# Patient Record
Sex: Female | Born: 1975
Health system: Southern US, Community
[De-identification: ages and names within clinical notes are randomized; demographics above are authoritative.]

## PROBLEM LIST (undated history)

## (undated) DIAGNOSIS — F419 Anxiety disorder, unspecified: Secondary | ICD-10-CM

## (undated) DIAGNOSIS — I442 Atrioventricular block, complete: Secondary | ICD-10-CM

## (undated) DIAGNOSIS — F1010 Alcohol abuse, uncomplicated: Secondary | ICD-10-CM

## (undated) DIAGNOSIS — F410 Panic disorder [episodic paroxysmal anxiety] without agoraphobia: Secondary | ICD-10-CM

## (undated) DIAGNOSIS — K589 Irritable bowel syndrome without diarrhea: Secondary | ICD-10-CM

## (undated) DIAGNOSIS — IMO0002 Reserved for concepts with insufficient information to code with codable children: Secondary | ICD-10-CM

## (undated) DIAGNOSIS — B373 Candidiasis of vulva and vagina: Secondary | ICD-10-CM

## (undated) DIAGNOSIS — Z8619 Personal history of other infectious and parasitic diseases: Secondary | ICD-10-CM

## (undated) DIAGNOSIS — R102 Pelvic and perineal pain: Secondary | ICD-10-CM

## (undated) DIAGNOSIS — I471 Supraventricular tachycardia: Secondary | ICD-10-CM

## (undated) DIAGNOSIS — F329 Major depressive disorder, single episode, unspecified: Secondary | ICD-10-CM

## (undated) DIAGNOSIS — I9789 Other postprocedural complications and disorders of the circulatory system, not elsewhere classified: Secondary | ICD-10-CM

## (undated) DIAGNOSIS — F32A Depression, unspecified: Secondary | ICD-10-CM

## (undated) HISTORY — DX: Candidiasis of vulva and vagina: B37.3

## (undated) HISTORY — DX: Atrioventricular block, complete: I44.2

## (undated) HISTORY — PX: CHOLECYSTECTOMY: SHX55

## (undated) HISTORY — DX: Irritable bowel syndrome, unspecified: K58.9

## (undated) HISTORY — DX: Supraventricular tachycardia: I47.1

## (undated) HISTORY — DX: Reserved for concepts with insufficient information to code with codable children: IMO0002

## (undated) HISTORY — DX: Personal history of other infectious and parasitic diseases: Z86.19

## (undated) HISTORY — DX: Pelvic and perineal pain: R10.2

## (undated) HISTORY — DX: Other postprocedural complications and disorders of the circulatory system, not elsewhere classified: I97.89

---
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
## 1999-04-19 ENCOUNTER — Encounter: Admission: RE | Admit: 1999-04-19 | Discharge: 1999-04-19 | Payer: Self-pay | Admitting: Otolaryngology

## 1999-04-19 ENCOUNTER — Encounter: Payer: Self-pay | Admitting: Otolaryngology

## 1999-10-26 ENCOUNTER — Other Ambulatory Visit: Admission: RE | Admit: 1999-10-26 | Discharge: 1999-10-26 | Payer: Self-pay | Admitting: *Deleted

## 2000-11-28 ENCOUNTER — Other Ambulatory Visit: Admission: RE | Admit: 2000-11-28 | Discharge: 2000-11-28 | Payer: Self-pay | Admitting: *Deleted

## 2002-02-15 ENCOUNTER — Emergency Department (HOSPITAL_COMMUNITY): Admission: EM | Admit: 2002-02-15 | Discharge: 2002-02-15 | Payer: Self-pay | Admitting: Emergency Medicine

## 2002-03-12 DIAGNOSIS — R87619 Unspecified abnormal cytological findings in specimens from cervix uteri: Secondary | ICD-10-CM

## 2002-03-12 DIAGNOSIS — IMO0002 Reserved for concepts with insufficient information to code with codable children: Secondary | ICD-10-CM

## 2002-03-12 HISTORY — DX: Unspecified abnormal cytological findings in specimens from cervix uteri: R87.619

## 2002-03-12 HISTORY — DX: Reserved for concepts with insufficient information to code with codable children: IMO0002

## 2002-04-22 ENCOUNTER — Other Ambulatory Visit: Admission: RE | Admit: 2002-04-22 | Discharge: 2002-04-22 | Payer: Self-pay | Admitting: *Deleted

## 2002-06-23 ENCOUNTER — Other Ambulatory Visit: Admission: RE | Admit: 2002-06-23 | Discharge: 2002-06-23 | Payer: Self-pay | Admitting: *Deleted

## 2003-02-16 ENCOUNTER — Other Ambulatory Visit: Admission: RE | Admit: 2003-02-16 | Discharge: 2003-02-16 | Payer: Self-pay | Admitting: *Deleted

## 2003-03-13 DIAGNOSIS — Z8619 Personal history of other infectious and parasitic diseases: Secondary | ICD-10-CM

## 2003-03-13 HISTORY — DX: Personal history of other infectious and parasitic diseases: Z86.19

## 2003-05-13 ENCOUNTER — Other Ambulatory Visit: Admission: RE | Admit: 2003-05-13 | Discharge: 2003-05-13 | Payer: Self-pay | Admitting: *Deleted

## 2003-10-27 ENCOUNTER — Inpatient Hospital Stay (HOSPITAL_COMMUNITY): Admission: AD | Admit: 2003-10-27 | Discharge: 2003-10-27 | Payer: Self-pay | Admitting: Obstetrics and Gynecology

## 2004-01-04 ENCOUNTER — Inpatient Hospital Stay (HOSPITAL_COMMUNITY): Admission: AD | Admit: 2004-01-04 | Discharge: 2004-01-09 | Payer: Self-pay | Admitting: Obstetrics and Gynecology

## 2004-01-09 ENCOUNTER — Encounter (INDEPENDENT_AMBULATORY_CARE_PROVIDER_SITE_OTHER): Payer: Self-pay | Admitting: *Deleted

## 2004-01-10 ENCOUNTER — Encounter: Admission: RE | Admit: 2004-01-10 | Discharge: 2004-02-09 | Payer: Self-pay | Admitting: Obstetrics and Gynecology

## 2004-01-17 ENCOUNTER — Inpatient Hospital Stay (HOSPITAL_COMMUNITY): Admission: RE | Admit: 2004-01-17 | Discharge: 2004-01-17 | Payer: Self-pay | Admitting: Obstetrics and Gynecology

## 2004-02-10 ENCOUNTER — Encounter: Admission: RE | Admit: 2004-02-10 | Discharge: 2004-03-11 | Payer: Self-pay | Admitting: Obstetrics and Gynecology

## 2004-03-12 ENCOUNTER — Encounter: Admission: RE | Admit: 2004-03-12 | Discharge: 2004-04-11 | Payer: Self-pay | Admitting: Obstetrics and Gynecology

## 2004-04-12 ENCOUNTER — Encounter: Admission: RE | Admit: 2004-04-12 | Discharge: 2004-05-12 | Payer: Self-pay | Admitting: Obstetrics and Gynecology

## 2004-06-10 ENCOUNTER — Encounter: Admission: RE | Admit: 2004-06-10 | Discharge: 2004-07-10 | Payer: Self-pay | Admitting: Obstetrics and Gynecology

## 2004-08-10 ENCOUNTER — Encounter: Admission: RE | Admit: 2004-08-10 | Discharge: 2004-09-09 | Payer: Self-pay | Admitting: Obstetrics and Gynecology

## 2004-10-10 ENCOUNTER — Encounter: Admission: RE | Admit: 2004-10-10 | Discharge: 2004-11-09 | Payer: Self-pay | Admitting: Obstetrics and Gynecology

## 2004-10-10 ENCOUNTER — Other Ambulatory Visit: Admission: RE | Admit: 2004-10-10 | Discharge: 2004-10-10 | Payer: Self-pay | Admitting: Obstetrics and Gynecology

## 2004-11-10 ENCOUNTER — Encounter: Admission: RE | Admit: 2004-11-10 | Discharge: 2004-12-09 | Payer: Self-pay | Admitting: Obstetrics and Gynecology

## 2004-12-10 ENCOUNTER — Encounter: Admission: RE | Admit: 2004-12-10 | Discharge: 2005-01-09 | Payer: Self-pay | Admitting: Obstetrics and Gynecology

## 2005-01-10 ENCOUNTER — Encounter: Admission: RE | Admit: 2005-01-10 | Discharge: 2005-02-08 | Payer: Self-pay | Admitting: Obstetrics and Gynecology

## 2005-02-09 ENCOUNTER — Encounter: Admission: RE | Admit: 2005-02-09 | Discharge: 2005-03-11 | Payer: Self-pay | Admitting: Obstetrics and Gynecology

## 2005-03-12 ENCOUNTER — Encounter: Admission: RE | Admit: 2005-03-12 | Discharge: 2005-04-11 | Payer: Self-pay | Admitting: Obstetrics and Gynecology

## 2005-03-12 DIAGNOSIS — R102 Pelvic and perineal pain: Secondary | ICD-10-CM

## 2005-03-12 HISTORY — DX: Pelvic and perineal pain: R10.2

## 2005-04-12 ENCOUNTER — Encounter: Admission: RE | Admit: 2005-04-12 | Discharge: 2005-05-09 | Payer: Self-pay | Admitting: Obstetrics and Gynecology

## 2005-05-10 ENCOUNTER — Encounter: Admission: RE | Admit: 2005-05-10 | Discharge: 2005-06-09 | Payer: Self-pay | Admitting: Obstetrics and Gynecology

## 2005-06-10 ENCOUNTER — Encounter: Admission: RE | Admit: 2005-06-10 | Discharge: 2005-07-10 | Payer: Self-pay | Admitting: Obstetrics and Gynecology

## 2005-12-26 ENCOUNTER — Other Ambulatory Visit: Admission: RE | Admit: 2005-12-26 | Discharge: 2005-12-26 | Payer: Self-pay | Admitting: Obstetrics and Gynecology

## 2006-05-11 DIAGNOSIS — B373 Candidiasis of vulva and vagina: Secondary | ICD-10-CM

## 2006-05-11 DIAGNOSIS — B3731 Acute candidiasis of vulva and vagina: Secondary | ICD-10-CM

## 2006-05-11 HISTORY — DX: Acute candidiasis of vulva and vagina: B37.31

## 2006-05-11 HISTORY — DX: Candidiasis of vulva and vagina: B37.3

## 2006-12-25 ENCOUNTER — Ambulatory Visit: Payer: Self-pay | Admitting: Internal Medicine

## 2007-05-16 DIAGNOSIS — R519 Headache, unspecified: Secondary | ICD-10-CM | POA: Insufficient documentation

## 2007-05-16 DIAGNOSIS — R51 Headache: Secondary | ICD-10-CM | POA: Insufficient documentation

## 2007-05-16 DIAGNOSIS — R197 Diarrhea, unspecified: Secondary | ICD-10-CM | POA: Insufficient documentation

## 2007-10-20 ENCOUNTER — Emergency Department (HOSPITAL_BASED_OUTPATIENT_CLINIC_OR_DEPARTMENT_OTHER): Admission: EM | Admit: 2007-10-20 | Discharge: 2007-10-20 | Payer: Self-pay | Admitting: Emergency Medicine

## 2009-01-16 ENCOUNTER — Ambulatory Visit: Payer: Self-pay | Admitting: Diagnostic Radiology

## 2009-01-16 ENCOUNTER — Emergency Department (HOSPITAL_BASED_OUTPATIENT_CLINIC_OR_DEPARTMENT_OTHER): Admission: EM | Admit: 2009-01-16 | Discharge: 2009-01-16 | Payer: Self-pay | Admitting: Emergency Medicine

## 2009-10-07 ENCOUNTER — Encounter (INDEPENDENT_AMBULATORY_CARE_PROVIDER_SITE_OTHER): Payer: Self-pay | Admitting: *Deleted

## 2009-10-07 ENCOUNTER — Emergency Department (HOSPITAL_BASED_OUTPATIENT_CLINIC_OR_DEPARTMENT_OTHER): Admission: EM | Admit: 2009-10-07 | Discharge: 2009-10-07 | Payer: Self-pay | Admitting: Emergency Medicine

## 2009-10-07 ENCOUNTER — Ambulatory Visit: Payer: Self-pay | Admitting: Diagnostic Radiology

## 2009-10-11 ENCOUNTER — Encounter: Payer: Self-pay | Admitting: Internal Medicine

## 2009-10-11 ENCOUNTER — Telehealth: Payer: Self-pay | Admitting: Internal Medicine

## 2009-10-14 ENCOUNTER — Telehealth (INDEPENDENT_AMBULATORY_CARE_PROVIDER_SITE_OTHER): Payer: Self-pay | Admitting: *Deleted

## 2009-10-24 ENCOUNTER — Ambulatory Visit: Payer: Self-pay | Admitting: Family Medicine

## 2009-10-27 ENCOUNTER — Ambulatory Visit: Payer: Self-pay | Admitting: Family Medicine

## 2009-11-04 DIAGNOSIS — R109 Unspecified abdominal pain: Secondary | ICD-10-CM | POA: Insufficient documentation

## 2009-11-04 DIAGNOSIS — Z8639 Personal history of other endocrine, nutritional and metabolic disease: Secondary | ICD-10-CM

## 2009-11-04 DIAGNOSIS — Z862 Personal history of diseases of the blood and blood-forming organs and certain disorders involving the immune mechanism: Secondary | ICD-10-CM | POA: Insufficient documentation

## 2009-11-04 DIAGNOSIS — K589 Irritable bowel syndrome without diarrhea: Secondary | ICD-10-CM | POA: Insufficient documentation

## 2009-11-04 DIAGNOSIS — R110 Nausea: Secondary | ICD-10-CM | POA: Insufficient documentation

## 2009-11-09 ENCOUNTER — Ambulatory Visit: Payer: Self-pay | Admitting: Surgery

## 2009-11-11 ENCOUNTER — Ambulatory Visit: Payer: Self-pay | Admitting: Surgery

## 2009-11-15 LAB — PATHOLOGY REPORT

## 2010-04-13 NOTE — Progress Notes (Signed)
Summary: triage  Phone Note Call from Patient Call back at Home Phone [phone]   Caller: Patient Call For: Dr. Juanda Chance Reason for Call: Talk to Nurse Details for Reason: triage Summary of Call: Pt of Dr. Juanda Chance. Last seen 2008. Went to ER. Having problems w/cramping, diarrhea, vomiting, gas/burping. ER referred her back to GI. Needs to be seen. Please call. Initial call taken by: Schuyler Amor,  October 11, 2009 11:29 AM  Follow-up for Phone Call        patient c/o abdominal cramping and gas.  Patient  was seen on Friday in the ER for the above.  Patient  is given an appointment with Dr Juanda Chance for 11/15/09 8:45.  I have also placed her back on the cancelation list. Follow-up by: Darcey Nora RN, CGRN,  October 11, 2009 11:53 AM

## 2010-04-13 NOTE — Discharge Summary (Signed)
Summary: Pregnancy Induced Hypertension w/Increased LFT'S  NAME:  [name] [name], [name] [name]           ACCOUNT NO.:  [id number]   MEDICAL RECORD NO.:  [id number]          PATIENT TYPE:  INP   LOCATION:  9131                          [facility]ILITY:  [facility]   PHYSICIAN:  Hal Morales, M.D.DATE OF BIRTH:  [birth year]   DATE OF ADMISSION:  01/04/2004  DATE OF DISCHARGE:  01/09/2004                                 DISCHARGE SUMMARY   ADMISSION DIAGNOSIS:  1.  Intrauterine pregnancy at 39 weeks.  2.  Pregnancy induced hypertension with mild elevation of liver function      tests and uric acid.  3.  Preeclampsia.   PROCEDURE:  Normal spontaneous vaginal delivery, second degree peroneal  laceration with repair. Postpartum anemia with hemoglobin on January 08, 2004, decreased to 6.9.  Postpartum blood transfusion.   HISTORY OF PRESENT ILLNESS:  [name] [name] is a 35 year old gravida 1, para 0  who presented at 44 weeks' gestation with elevated blood pressures and  slightly elevated liver function tests.  She was begun on magnesium sulfate  and her labor induced with Pitocin.  She progressed to complete and after  pushing delivered a viable female infant, named [name] [name], with Apgar scores of 9  at one minute and 9 at five minutes.  A second degree peroneal laceration  was repaired with 2-0 chromic by Dr. Jaymes Graff, delivering physician.  The patient continued on magnesium sulfate for 24 hours and her blood  pressures remained somewhat elevated into postpartum day #1 in the 140/90  range.  She began to diurese, however, she was symptomatic with her  postpartum anemia at 6.9 and consented to blood transfusion.  She received 4  units of packed red blood cells and on this her fourth postpartum day she is  feeling much, much better.  She received Lasix for diuresis, and she  diuresed well over 2000 mL of urine.  Her blood pressures remained stable in  the 130-150/80-90.  Her CBC found her hemoglobin to be 12.8  post  transfusion, and she was therefore discharged home.   DISCHARGE INSTRUCTIONS:  Per [facility] handout.   DISCHARGE MEDICATIONS:  1.  Motrin 600 mg p.o. q.6h. p.r.n. pain.  2.  Tylox one to two p.o. q.3-4h. p.r.n. pain.  3.  Prenatal vitamins.  The patient will continue her daily prenatal      vitamins and will call for any signs or symptoms of PIH, any headaches,      visual changes, or epigastric pain, any continued dizziness or syncope,      any elevated temperature, or any problems or concerns.  She will follow      up in the office of [facility] in six weeks.  4.  She will have a visit from the [facility] Nurse this week for BP check.     [name]   SDM/MEDQ  D:  01/09/2004  T:  01/09/2004  Job:  [id number]

## 2010-04-13 NOTE — Progress Notes (Signed)
  Phone Note Other Incoming   Request: Send information Action Taken: Software engineer of Call: Request for records received from [employer]. Request forwarded to [employer].

## 2010-04-13 NOTE — Letter (Signed)
Summary: New Patient letter  [facility]  [address] [place], [place] [address]   Phone: [phone]  Fax: [phone]       10/11/2009 MRN: [id number]  [facility] [address] [place], [place]  [address]  Dear [name] [name],  Welcome to the Gastroenterology Division at [facility].    You are scheduled to see Dr.  Juanda Chance  on 11/15/09 at 8:45 on the 3rd floor at [facility], [address]  We ask that you try to arrive at our office 15 minutes prior to your appointment time to allow for check-in.  We would like you to complete the enclosed self-administered evaluation form prior to your visit and bring it with you on the day of your appointment.  We will review it with you.  Also, please bring a complete list of all your medications or, if you prefer, bring the medication bottles and we will list them.  Please bring your insurance card so that we may make a copy of it.  If your insurance requires a referral to see a specialist, please bring your referral form from your primary care physician.  Co-payments are due at the time of your visit and may be paid by cash, check or credit card.     Your office visit will consist of a consult with your physician (includes a physical exam), any laboratory testing he/she may order, scheduling of any necessary diagnostic testing (e.g. x-ray, ultrasound, CT-scan), and scheduling of a procedure (e.g. Endoscopy, Colonoscopy) if required.  Please allow enough time on your schedule to allow for any/all of these possibilities.    If you cannot keep your appointment, please call [phone] to cancel or reschedule prior to your appointment date.  This allows [place] the opportunity to schedule an appointment for another patient in need of care.  If you do not cancel or reschedule by 5 p.m. the business day prior to your appointment date, you will be charged a $50.00 late cancellation/no-show fee.    Thank you for choosing  [facility] for your medical needs.  We appreciate the opportunity to care for you.  Please visit [place] at our website  to learn more about our practice.                     Sincerely,                                                             The Gastroenterology Division

## 2010-05-27 LAB — COMPREHENSIVE METABOLIC PANEL
ALT: 17 U/L (ref 0–35)
AST: 27 U/L (ref 0–37)
Alkaline Phosphatase: 49 U/L (ref 39–117)
CO2: 28 mEq/L (ref 19–32)
Chloride: 105 mEq/L (ref 96–112)
GFR calc Af Amer: >60 mL/min (ref >60)
GFR calc non Af Amer: >60 mL/min (ref >60)
Potassium: 4.2 mEq/L (ref 3.5–5.1)
Sodium: 142 mEq/L (ref 135–145)
Total Bilirubin: 0.6 mg/dL (ref 0.3–1.2)

## 2010-05-27 LAB — URINALYSIS, ROUTINE W REFLEX MICROSCOPIC
Bilirubin Urine: NEGATIVE
Ketones, ur: NEGATIVE mg/dL
Nitrite: NEGATIVE
pH: 6.5 (ref 5.0–8.0)

## 2010-05-27 LAB — DIFFERENTIAL
Basophils Absolute: 0 10*3/uL (ref 0.0–0.1)
Eosinophils Absolute: 0.1 10*3/uL (ref 0.0–0.7)
Eosinophils Relative: 1 % (ref 0–5)

## 2010-05-27 LAB — LIPASE, BLOOD: Lipase: 85 U/L (ref 23–300)

## 2010-05-27 LAB — PREGNANCY, URINE: Preg Test, Ur: NEGATIVE

## 2010-05-27 LAB — URINE MICROSCOPIC-ADD ON

## 2010-05-27 LAB — CBC
Hemoglobin: 12.7 g/dL (ref 12.0–15.0)
MCH: 31.8 pg (ref 26.0–34.0)
RBC: 4 MIL/uL (ref 3.87–5.11)

## 2010-07-25 NOTE — Assessment & Plan Note (Signed)
[name]                         GASTROENTEROLOGY OFFICE NOTE   NAME:DONNELLAshlon, [name]                  MRN:          [id number]  DATE:12/25/2006                            DOB:          1975-08-28    [name] [name] is a very nice 35 year old white female who has crampy  abdominal pain, usually after meals, who also has diarrhea which occurs  several times a day, usually postprandially but very rarely at night.  Her symptoms have been going on for about a year and a half.  She denies  ever having actually solid formed stools.  She denies any rectal  bleeding.  Having a bowel movement usually relieves her symptoms.  She  also has some heart burn and indigestion with belching.  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 she also has a 37-year-old son  so she has been quite stressed out recently and has had problems  sleeping.  Her eating habits are regular.  She denies excess caffeine  but she continues to smoke about a half pack of cigarettes a day.  On  two occasions [name] [name] had an accident where she could not get to the  bathroom in time.  Her usual weight is around 138 pounds, she is  currently about 147 pounds.  She has not been able to lose her weight  since her first pregnancy.  Dr. Wylene Simmer has done blood tests including a  blood count and CMET which were all normal.   MEDICATIONS:  Birth control pills.   PAST MEDICAL HISTORY:  1. High blood pressure during pregnancy.  2. Chronic headaches.   OPERATION:  T&A.   FAMILY HISTORY:  Breast cancer in grandmother, diabetes in mother.   SOCIAL HISTORY:  Married with one child. She has a Naval architect,  works in Airline pilot.  She smokes one-half pack of cigarettes a day. Does not  drink alcohol other than socially.   REVIEW OF SYSTEMS:  Positive for eye glasses.  Sleeping problems.  Headaches, anxiety. Crampy abdominal pain.   PHYSICAL EXAMINATION:  VITAL SIGNS:  Blood pressure  102/70, pulse 76 and  weight 147 pounds.  GENERAL APPEARANCE:  She was alert and oriented, in no distress.  LUNGS:  Clear to auscultation.  CARDIOVASCULAR:  Normal S1, normal S2.  NECK:  Supple, there was no adenopathy, thyroid was normal.  ABDOMEN:  Soft but very tender in the epigastric area in the midline  above the umbilicus.  Tenderness did not radiate to left or right upper  quadrants, no CVA tenderness.  Lower abdomen was unremarkable.  There  was minimal discomfort in the left lower quadrant, no abnormal  pulsations.  RECTAL EXAM:  With normal tone, empty rectum and previous stool was heme  negative.  EXTREMITIES:  No edema.   IMPRESSION:  [age]-year-old white female with symptoms of postprandial  diarrhea and some urgency consistent with irritable bowel syndrome.  No  weight changes or rectal bleeding.  Very low possibility of inflammatory  bowel disease, in fact her symptoms are classical of irritable bowel  syndrome, they correlate with her recent stress at work  as well as being  a mother of a 26-year-old child.  Her smoking is contributing to some of  her dyspeptic symptoms.   PLAN:  1. We have discussed irritable bowel syndrome and the fact that it is      connected to her life style and general stress level.  Before      considering any psychotropic agents I would treat her with      antispasmodics first and dietary modification, and general      education by giving her a booklet on irritable bowel syndrome.  We      will start her on Robinul forte 2 mg  po b.i.d.  If she is unable      to take it because of dry mouth or dizziness she will start with a      half tablet b.i.d.  I have also given her a high fiber diet and      booklet on IBS.  If her symptoms do not improve I would consider      using  SSRI on a temporary basis.  At some point if the symptoms      are refractory to the treatment or become more prominent, or if she      passes blood in the stool, I would  consider colonoscopy.     [name]. Juanda Chance, MD  Electronically Signed    DMB/MedQ  DD: 12/25/2006  DT: 12/25/2006  Job #: [id number]

## 2010-07-28 NOTE — Discharge Summary (Signed)
[name] [name], [name] [name]           ACCOUNT NO.:  [id number]   MEDICAL RECORD NO.:  [id number]          PATIENT TYPE:  INP   LOCATION:  9131                          [facility]ILITY:  [facility]   PHYSICIAN:  Hal Morales, M.D.DATE OF BIRTH:  17-Apr-1975   DATE OF ADMISSION:  01/04/2004  DATE OF DISCHARGE:  01/09/2004                                 DISCHARGE SUMMARY   ADMISSION DIAGNOSIS:  1.  Intrauterine pregnancy at 39 weeks.  2.  Pregnancy induced hypertension with mild elevation of liver function      tests and uric acid.  3.  Preeclampsia.   PROCEDURE:  Normal spontaneous vaginal delivery, second degree peroneal  laceration with repair. Postpartum anemia with hemoglobin on January 08, 2004, decreased to 6.9.  Postpartum blood transfusion.   HISTORY OF PRESENT ILLNESS:  [name] [name] is a 35 year old gravida 1, para 0  who presented at 17 weeks' gestation with elevated blood pressures and  slightly elevated liver function tests.  She was begun on magnesium sulfate  and her labor induced with Pitocin.  She progressed to complete and after  pushing delivered a viable female infant, named [name] [name], with Apgar scores of 9  at one minute and 9 at five minutes.  A second degree peroneal laceration  was repaired with 2-0 chromic by Dr. Jaymes Graff, delivering physician.  The patient continued on magnesium sulfate for 24 hours and her blood  pressures remained somewhat elevated into postpartum day #1 in the 140/90  range.  She began to diurese, however, she was symptomatic with her  postpartum anemia at 6.9 and consented to blood transfusion.  She received 4  units of packed red blood cells and on this her fourth postpartum day she is  feeling much, much better.  She received Lasix for diuresis, and she  diuresed well over 2000 mL of urine.  Her blood pressures remained stable in  the 130-150/80-90.  Her CBC found her hemoglobin to be 12.8 post  transfusion, and she was therefore discharged  home.   DISCHARGE INSTRUCTIONS:  Per [facility] handout.   DISCHARGE MEDICATIONS:  1.  Motrin 600 mg p.o. q.6h. p.r.n. pain.  2.  Tylox one to two p.o. q.3-4h. p.r.n. pain.  3.  Prenatal vitamins.  The patient will continue her daily prenatal      vitamins and will call for any signs or symptoms of PIH, any headaches,      visual changes, or epigastric pain, any continued dizziness or syncope,      any elevated temperature, or any problems or concerns.  She will follow      up in the office of [facility] in six weeks.  4.  She will have a visit from the [facility] Nurse this week for BP check.     [name]   SDM/MEDQ  D:  01/09/2004  T:  01/09/2004  Job:  [id number]

## 2010-07-28 NOTE — H&P (Signed)
[name] [name], [name] [name] NO.:  [id number]   MEDICAL RECORD NO.:  [id number]          PATIENT TYPE:  INP   LOCATION:  9167                          [facility]  [facility]   PHYSICIAN:  Hal Morales, M.D.DATE OF BIRTH:  1976/03/09   DATE OF ADMISSION:  01/04/2004  DATE OF DISCHARGE:                                HISTORY & PHYSICAL   HISTORY OF PRESENT ILLNESS:  [name] [name] is a 35 year old gravida 1, para 0  at 65 weeks who presents today for induction secondary to preeclampsia.  The  patient was seen in the office today with normal blood pressures, however,  she complained of persistent headache and right upper quadrant pain.  She  was sent to Maternity Admissions Unit for [facility] lab evaluation.  She was found  to have slightly elevated SGOT of 63, a uric acid of 7.1; platelet count was  168,000.  Her blood pressures were in the 140s over 90 range with one in the  130/106 range; she is therefore to be admitted for induction.  Her urine  cath UA was negative for protein, however, the decision was made to admit  her, in light of her laboratory values and elevated blood pressure.  Pregnancy had been remarkable for:  #1 - PIH identified at approximately 29  weeks with patient being followed with twice-weekly NSTs.  She had a normal  urine 24-hour protein on December 01, 2003.  #2 - History of abnormal Pap.  #3 - History of migraines.  #4 - History of echogenic intracardiac focus and  left fetal renal pyelectasis, with these resolving on ultrasound by the end  of her pregnancy.   PRENATAL LABORATORIES:  Blood type is A-positive, Rh-antibody negative.  VDRL nonreactive.  Rubella titer positive.  Hepatitis B surface antigen  negative.  HIV was declined.  Cystic fibrosis testing was negative.  GC and  Chlamydia cultures were negative.  Pap was normal.  Hemoglobin upon entry  into practice was 11.9; it was 13.8 at 26 weeks.  Group B strep culture and  GC and Chlamydia cultures  were negative at 36 weeks.  Quadruple screen was  normal.  EDC of January 11, 2004 was established by last menstrual period  and was in agreement with ultrasound at approximately 12 weeks.   HISTORY OF PRESENT PREGNANCY:  The patient entered care at approximately 12  weeks.  She had some migraine headaches in early pregnancy.  She was placed  on ibuprofen at approximately 16 weeks for migraines; this did help.  At 18  weeks, she had another ultrasound showing normal growth and development.  There was an echogenic intracardiac focus in the left ventricle and left  fetal renal pyelectasis noted.  Quadruple screen was done which was normal.  She had another ultrasound at 26 weeks showing echogenic intracardiac focus  was now not seen.  Fetal renal pyelectasis was still present.  She did begin  to have some swelling at that time.  Her 1-hour Glucola was normal.  At 29  weeks, she had a blood pressure of 140/100 and 120/80.  She was having a  little bit  of shortness of breath.  She had a 24-hour urine done at that  time that was normal.  She was placed on bedrest at that time.  Her pressure  remained in the 140s over 70 range.  She had another ultrasound at 33 weeks  showing no pyelectasis and no echogenic intracardiac focus.  Her blood  pressures would be in the 140s over 90s to the 120s over 77.  Growth at that  33-week ultrasound was at the 58th to 60th percentile with normal fluid.  She had another 24-hour urine at 33 weeks with normal protein.  Twice-weekly  NSTs were begun at approximately 34 weeks.  She also had an echocardiogram  that was normal secondary to a history of some shortness of breath.  NSTs  were done twice-weekly.  She had a BPP on December 14, 2003 that was normal  secondary to nonreactive NST and another one at 36 weeks.  She was treated  for a respiratory infection on the 18th of October that was noted to be  viral; she was placed on Tylenol and Robitussin.  She presented  to the  office today for an NST which was reactive, however, she did report an  increase in headaches and the right upper quadrant pain; that is when she  was admitted to the hospital for induction from Maternity Admissions.   OBSTETRICAL HISTORY:  The patient is a primigravida.   MEDICAL HISTORY:  1.  She stopped Ortho Tri-Cyclen in August of 2004.  2.  In 2004, she had an abnormal Pap smear, had a colposcopy and has had      normal Paps since.  3.  She reports the usual childhood illnesses.  4.  In 1997, she was hospitalized for nausea and vomiting from a viral      illness.   SURGICAL HISTORY:  Surgical history includes tonsils out in December of  1995.   ALLERGIES:  She has no known medication allergies.   FAMILY HISTORY:  Her maternal grandfather had a heart attack.  Maternal  grandmother has diabetes.  Paternal grandmother had some type of cancer.  Her sister had a benign lump removed from her shoulder.   GENETIC HISTORY:  Unremarkable.   SOCIAL HISTORY:  The patient is married to the father of the baby; he is  involved and supportive; his name is Shelbylynn Walczyk.  The patient is  Caucasian, of the [place] faith.  She has been followed by the certified  nurse midwife service and the physician service concurrently.  She is now to  be transferred to the physician service secondary to worsening PIH.  She has  a Naval architect.  She is employed in Airline pilot.  Her husband has 1 year of  college; he is self-employed.  She denies any alcohol, drug or tobacco use  during this pregnancy.   PHYSICAL EXAM:  VITAL SIGNS:  Blood pressures are ranging from 140s over 90s  to 140s over 100.  Other vital signs are stable.  HEENT:  Within normal limits.  LUNGS:  Bilateral breath sounds are clear.  HEART:  Regular rate and rhythm without murmur.  BREASTS:  Breasts are soft and nontender. ABDOMEN:  Fundal height is approximately 38 cm.  Estimated fetal weight is 7-  8 pounds.  Uterine  contractions are very occasional and mild.  Fetal heart  rate is reactive with no decelerations.  PELVIC:  Cervical exam is 1 cm, 70%, vertex at a -1 station.  EXTREMITIES:  Deep tendon  reflexes are 2 to 3+ without clonus.  There is 1+  edema noted in the lower extremities.   LABORATORY VALUES:  Lab values show a negative urine protein on catheter  specimen.  CBC shows a platelet count of 168,000 with normal hemoglobin.  Comprehensive metabolic panel showed an SGOT of 63, which is slightly  elevated, and SGOT of 40, which is upper limits of normal.  BUN and  creatinine were normal.  LDH was normal.  Uric acid was 7.1, which is  slightly elevated.   IMPRESSION:  1.  Intrauterine pregnancy at 39 weeks.  2.  Pregnancy-induced hypertension with mild elevation of liver function      tests and uric acid.  3.  Negative group B streptococcus.   PLAN:  1.  Admit to birthing suite per consult with Dr. Hal Morales as      attending physician.  2.  Routine physician orders.  3.  Dr. Pennie Rushing plans Cytotec for cervical ripening tonight and Pitocin in      the morning.  4.  We will repeat PIH labs in the morning as well.     Vick   VLL/MEDQ  D:  01/05/2004  T:  01/05/2004  Job:  [id number]

## 2011-07-26 ENCOUNTER — Ambulatory Visit (INDEPENDENT_AMBULATORY_CARE_PROVIDER_SITE_OTHER): Payer: 59 | Admitting: Obstetrics and Gynecology

## 2011-07-26 ENCOUNTER — Encounter: Payer: Self-pay | Admitting: Obstetrics and Gynecology

## 2011-07-26 VITALS — BP 114/74 | Temp 98.8°F | Ht 64.0 in | Wt 144.0 lb

## 2011-07-26 DIAGNOSIS — K589 Irritable bowel syndrome without diarrhea: Secondary | ICD-10-CM | POA: Insufficient documentation

## 2011-07-26 DIAGNOSIS — O1490 Unspecified pre-eclampsia, unspecified trimester: Secondary | ICD-10-CM

## 2011-07-26 DIAGNOSIS — IMO0002 Reserved for concepts with insufficient information to code with codable children: Secondary | ICD-10-CM | POA: Insufficient documentation

## 2011-07-26 DIAGNOSIS — B3731 Acute candidiasis of vulva and vagina: Secondary | ICD-10-CM

## 2011-07-26 DIAGNOSIS — R6889 Other general symptoms and signs: Secondary | ICD-10-CM

## 2011-07-26 DIAGNOSIS — Z8619 Personal history of other infectious and parasitic diseases: Secondary | ICD-10-CM | POA: Insufficient documentation

## 2011-07-26 DIAGNOSIS — B373 Candidiasis of vulva and vagina: Secondary | ICD-10-CM

## 2011-07-26 DIAGNOSIS — Z124 Encounter for screening for malignant neoplasm of cervix: Secondary | ICD-10-CM

## 2011-07-26 DIAGNOSIS — R87619 Unspecified abnormal cytological findings in specimens from cervix uteri: Secondary | ICD-10-CM | POA: Insufficient documentation

## 2011-07-26 DIAGNOSIS — Z01419 Encounter for gynecological examination (general) (routine) without abnormal findings: Secondary | ICD-10-CM

## 2011-07-26 MED ORDER — NORETHIN-ETH ESTRAD-FE BIPHAS 1 MG-10 MCG / 10 MCG PO TABS: 1.0000 | ORAL_TABLET | Freq: Every day | ORAL | Status: DC

## 2011-07-26 MED ORDER — FLUCONAZOLE 150 MG PO TABS: 150.0000 mg | ORAL_TABLET | Freq: Once | ORAL | Status: DC

## 2011-07-26 NOTE — Progress Notes (Signed)
Subjective:    [name] [name] is a 36 y.o. female, G1P1001, who presents for an annual exam. The patient reports vaginitis symptoms.  Has quit smoking !!!  Has been tested for every STD lately because she's went to Urgent Care twice for uti's in past several months.  Menstrual cycle:   LMP: Patient's last menstrual period was 07/06/2011.           Cycle is monthly with normal flow                                  and without intermenstrual bleeding or severe dysmenorrhea  Review of Systems Pertinent items are noted in HPI. Denies pelvic pain, uti symptoms, vaginitis symptoms, irregular bleeding, menopausal symptoms, change in bowel habits or rectal bleeding   Objective:    BP 114/74  Temp(Src) 98.8 F (37.1 C) (Oral)  Ht 5\' 4"  (1.626 m)  Wt 144 lb (65.318 kg)  BMI 24.72 kg/m2  LMP 07/06/2011  Wt Readings from Last 1 Encounters:  07/26/11 144 lb (65.318 kg)   Body mass index is 24.72 kg/(m^2). General Appearance: Alert, appropriate appearance for age. No acute distress HEENT: Grossly normal Neck / Thyroid: Supple, no thyromegaly or cervical adenopathy Lungs: clear to auscultation bilaterally Back: No CVA tenderness Breast Exam: No masses or nodes.No dimpling, nipple retraction or discharge. Cardiovascular: Regular rate and rhythm.  Gastrointestinal: Soft, non-tender, no masses or organomegaly Pelvic Exam: EGBUS-wnl, vagina-normal rugae, cervix- without lesions or tenderness, uterus appears normal size shape and consistency, adnexae-no masses or tenderness Lymphatic Exam: Non-palpable nodes in neck,  axillary, or inguinal regions  Skin: no rashes or abnormalities Extremities: no clubbing cyanosis or edema  Neurologic: grossly normal Psychiatric: Alert and oriented, appropriate affect.  Wet Prep: pH-4.5, whiff-negative, yeast +     Assessment:   Routine GYN Exam Yeast Infection-persistent vs recurrent   Plan:  PAP sent  Diflucan 150 mg # 3 1 po stat, repeat x 2  every 3 days   Perineal hygiene  Lo loestrin Fe #1  1 po qd 11 refills  Francisca Harbuck,ELMIRAPA-C

## 2011-07-26 NOTE — Patient Instructions (Signed)
Avoid: - excess soap on genital area (consider using plain oatmeal soap) - use of powder or sprays in genital area - douching - wearing underwear to bed (except with menses) - using more than is directed detergent when washing clothes - tight fitting garments around genital area - excess sugar intake   

## 2011-07-26 NOTE — Progress Notes (Signed)
Regular Periods: yes Mammogram: no  Monthly Breast Ex.: no Exercise: yes  Tetanus < 10 years: yes Seatbelts: yes  NI. Bladder Functn.: yes Abuse at home: no  Daily BM's: yes Stressful Work: yes  Healthy Diet: yes Sigmoid-Colonoscopy: no  Calcium: no Medical problems this year: vaginal discharge   LAST PAP:2012 nl  Contraception: LOLOESTRIN    Mammogram: NO  PCP: DR. Wylene Simmer  PMH: NO CHANGE  FMH: NO CHANGE  Last Bone Scan: NO

## 2011-07-30 LAB — PAP IG W/ RFLX HPV ASCU

## 2011-08-11 ENCOUNTER — Other Ambulatory Visit: Payer: Self-pay | Admitting: Obstetrics and Gynecology

## 2011-08-13 ENCOUNTER — Other Ambulatory Visit: Payer: Self-pay | Admitting: Obstetrics and Gynecology

## 2011-08-13 MED ORDER — FLUCONAZOLE 150 MG PO TABS: 150.0000 mg | ORAL_TABLET | Freq: Once | ORAL | Status: AC

## 2011-08-13 NOTE — Telephone Encounter (Signed)
Ep'    This pt is requesting this rx.  lm

## 2011-12-07 ENCOUNTER — Other Ambulatory Visit: Payer: Self-pay | Admitting: Obstetrics and Gynecology

## 2011-12-07 NOTE — Telephone Encounter (Signed)
Spoke with pt rgd msg. Pt stated that her rx is about 89.00 for a pack of pills lolo estrin . Offered pt a sample of lolo to pick up on Tuesday  at her lunch .advised pt she might want to discuss with EP other b/c potions . Pt's voice understanding . bt cma

## 2011-12-07 NOTE — Telephone Encounter (Signed)
Wants samples of BCPs

## 2012-04-07 ENCOUNTER — Emergency Department (HOSPITAL_BASED_OUTPATIENT_CLINIC_OR_DEPARTMENT_OTHER)
Admission: EM | Admit: 2012-04-07 | Discharge: 2012-04-07 | Disposition: A | Discharge status: Home / Self Care | Payer: 59 | Attending: Emergency Medicine | Admitting: Emergency Medicine

## 2012-04-07 ENCOUNTER — Emergency Department (HOSPITAL_BASED_OUTPATIENT_CLINIC_OR_DEPARTMENT_OTHER): Payer: 59

## 2012-04-07 ENCOUNTER — Encounter (HOSPITAL_BASED_OUTPATIENT_CLINIC_OR_DEPARTMENT_OTHER): Payer: Self-pay | Admitting: Family Medicine

## 2012-04-07 DIAGNOSIS — Z79899 Other long term (current) drug therapy: Secondary | ICD-10-CM | POA: Insufficient documentation

## 2012-04-07 DIAGNOSIS — F411 Generalized anxiety disorder: Secondary | ICD-10-CM | POA: Insufficient documentation

## 2012-04-07 DIAGNOSIS — Z8719 Personal history of other diseases of the digestive system: Secondary | ICD-10-CM | POA: Insufficient documentation

## 2012-04-07 DIAGNOSIS — F419 Anxiety disorder, unspecified: Secondary | ICD-10-CM

## 2012-04-07 DIAGNOSIS — I498 Other specified cardiac arrhythmias: Secondary | ICD-10-CM | POA: Insufficient documentation

## 2012-04-07 DIAGNOSIS — Z8619 Personal history of other infectious and parasitic diseases: Secondary | ICD-10-CM | POA: Insufficient documentation

## 2012-04-07 DIAGNOSIS — Z8742 Personal history of other diseases of the female genital tract: Secondary | ICD-10-CM | POA: Insufficient documentation

## 2012-04-07 DIAGNOSIS — I471 Supraventricular tachycardia: Secondary | ICD-10-CM

## 2012-04-07 LAB — TROPONIN I: Troponin I: <0.3 ng/mL (ref <0.30)

## 2012-04-07 LAB — COMPREHENSIVE METABOLIC PANEL
ALT: 11 U/L (ref 0–35)
AST: 23 U/L (ref 0–37)
Albumin: 4.5 g/dL (ref 3.5–5.2)
Alkaline Phosphatase: 54 U/L (ref 39–117)
BUN: 16 mg/dL (ref 6–23)
Chloride: 100 mEq/L (ref 96–112)
Potassium: 3.8 mEq/L (ref 3.5–5.1)
Sodium: 136 mEq/L (ref 135–145)
Total Bilirubin: 0.3 mg/dL (ref 0.3–1.2)
Total Protein: 7.9 g/dL (ref 6.0–8.3)

## 2012-04-07 LAB — CBC WITH DIFFERENTIAL/PLATELET
Eosinophils Relative: 1 % (ref 0–5)
HCT: 41 % (ref 36.0–46.0)
Lymphocytes Relative: 31 % (ref 12–46)
Lymphs Abs: 5.2 10*3/uL — ABNORMAL HIGH (ref 0.7–4.0)
MCV: 90.5 fL (ref 78.0–100.0)
Monocytes Relative: 6 % (ref 3–12)
Platelets: 357 10*3/uL (ref 150–400)
RBC: 4.53 MIL/uL (ref 3.87–5.11)
WBC: 16.8 10*3/uL — ABNORMAL HIGH (ref 4.0–10.5)

## 2012-04-07 LAB — TSH: TSH: 2.87 u[IU]/mL (ref 0.350–4.500)

## 2012-04-07 LAB — PREGNANCY, URINE: Preg Test, Ur: NEGATIVE

## 2012-04-07 MED ORDER — METOPROLOL SUCCINATE ER 25 MG PO TB24: 25.0000 mg | ORAL_TABLET | Freq: Every day | ORAL | Status: DC

## 2012-04-07 MED ORDER — LORAZEPAM 2 MG/ML IJ SOLN
INTRAMUSCULAR | Status: AC
  Administered 2012-04-07: 1 mg
  Filled 2012-04-07: qty 1

## 2012-04-07 MED ORDER — METOPROLOL TARTRATE 50 MG PO TABS: 25.0000 mg | ORAL_TABLET | Freq: Once | ORAL | Status: DC

## 2012-04-07 MED ORDER — ADENOSINE 6 MG/2ML IV SOLN
INTRAVENOUS | Status: AC
  Administered 2012-04-07: 6 mg
  Filled 2012-04-07: qty 6

## 2012-04-07 MED ORDER — METOPROLOL TARTRATE 12.5 MG HALF TABLET
12.5000 mg | ORAL_TABLET | Freq: Once | ORAL | Status: AC
  Administered 2012-04-07: 12.5 mg via ORAL
  Filled 2012-04-07: qty 1

## 2012-04-07 MED ORDER — METOPROLOL TARTRATE 50 MG PO TABS
ORAL_TABLET | ORAL | Status: AC
  Filled 2012-04-07: qty 1

## 2012-04-07 NOTE — ED Provider Notes (Signed)
History     CSN: [id number]  Arrival date & time 04/07/12  1544   First MD Initiated Contact with Patient 04/07/12 1547      Chief Complaint  Patient presents with  . Tachycardia  . Anxiety    (Consider location/radiation/quality/duration/timing/severity/associated sxs/prior treatment) Patient is a 37 y.o. female presenting with anxiety and palpitations. The history is provided by the patient.  Anxiety This is a recurrent problem. The current episode started today. The problem occurs constantly. The problem has been unchanged. Associated symptoms include chest pain. Pertinent negatives include no abdominal pain, coughing, fever or nausea. Nothing aggravates the symptoms. She has tried nothing for the symptoms.  Palpitations  This is a new problem. The current episode started 1 to 2 hours ago. The problem occurs constantly. The problem has not changed since onset.The problem is associated with stress. Associated symptoms include chest pain. Pertinent negatives include no fever, no abdominal pain, no nausea and no cough.    Past Medical History  Diagnosis Date  . Abnormal Pap smear 2004    colpo  . H/O candidiasis   . H/O varicella 2005  . Pre-eclampsia     H/O  . Pelvic pain in female 2007  . Candida vaginitis 05/2006  . IBS (irritable bowel syndrome)     Past Surgical History  Procedure Date  . Tonsillectomy 1995  . Cholecystectomy     Family History  Problem Relation Age of Onset  . Diabetes Mother   . Cancer Sister     Lump removed frfom shoulder  . Heart disease Maternal Grandfather     MI  . Cancer Paternal Grandmother     breast    History  Substance Use Topics  . Smoking status: Never Smoker   . Smokeless tobacco: Never Used  . Alcohol Use: No    OB History    Grav Para Term Preterm Abortions TAB SAB Ect Mult Living   1 1 1       1       Review of Systems  Constitutional: Negative for fever.  Respiratory: Negative for cough.   Cardiovascular:  Positive for chest pain and palpitations.  Gastrointestinal: Negative for nausea and abdominal pain.  All other systems reviewed and are negative.    Allergies  Review of patient's allergies indicates no known allergies.  Home Medications   Current Outpatient Rx  Name  Route  Sig  Dispense  Refill  . FLUCONAZOLE 150 MG PO TABS      TAKE ONE TABLET BY MOUTH NOW AND REPEAT EVERY 3 DAYS UNTIL COMPLETED   3 tablet   2   . NORETHIN-ETH ESTRAD-FE BIPHAS 1 MG-10 MCG / 10 MCG PO TABS   Oral   Take 1 tablet by mouth daily.   1 Package   11   . NORETHIN-ETH ESTRAD-FE BIPHAS 1 MG-10 MCG / 10 MCG PO TABS   Oral   Take 1 tablet by mouth daily.   2 Package   0     BP 148/98  Pulse 108  Resp 20  SpO2 100%  LMP 03/31/2012  Physical Exam  Nursing note and vitals reviewed. Constitutional: She is oriented to person, place, and time. She appears well-developed and well-nourished. No distress.  HENT:  Head: Normocephalic and atraumatic.  Eyes: EOM are normal. Pupils are equal, round, and reactive to light.  Neck: Normal range of motion. Neck supple.  Cardiovascular: Regular rhythm.  Tachycardia present.  Exam reveals no friction rub.  No murmur heard.      Tachycardic in the 260s.  Pulmonary/Chest: Effort normal and breath sounds normal. No respiratory distress. She has no wheezes. She has no rales.  Abdominal: Soft. She exhibits no distension. There is no tenderness. There is no rebound.  Musculoskeletal: Normal range of motion. She exhibits no edema.  Neurological: She is alert and oriented to person, place, and time.  Skin: She is not diaphoretic.    ED Course  Procedures (including critical care time)  Labs Reviewed  CBC WITH DIFFERENTIAL - Abnormal; Notable for the following:    WBC 16.8 (*)     Neutro Abs 10.2 (*)     Lymphs Abs 5.2 (*)     Basophils Absolute 0.2 (*)     All other components within normal limits  COMPREHENSIVE METABOLIC PANEL - Abnormal; Notable  for the following:    CO2 18 (*)     Glucose, Bld 138 (*)     All other components within normal limits  TROPONIN I  TSH  PREGNANCY, URINE  TROPONIN I   No results found.   No diagnosis found.   Date: 04/07/2012  Rate: 262  Rhythm: supraventricular tachycardia (SVT)  QRS Axis: normal  Intervals: no PR  ST/T Wave abnormalities: ST depressions diffusely  Conduction Disutrbances:none  Narrative Interpretation:   Old EKG Reviewed: none available  EKG after Adenosine  Date: 04/07/2012  Rate: 122  Rhythm: sinus tachycardia  QRS Axis: normal  Intervals: PR shortened  ST/T Wave abnormalities: normal  Conduction Disutrbances:none  Narrative Interpretation:   Old EKG Reviewed: changes noted and not in SVT anymore      MDM   Patient is a 12 rolled female who presents with anxiety and tachycardia. She had a stress all done at work became very anxious. She also noted some chest pain. In triage she had an extremely rapid heart rate in the 260s. She states no history of SVT or with Parkinson White syndrome. She does have history of anxiety. On EKG she has SVT in the 260s. Blood pressure is stable. 6 mg of adenosine administered with slowing of the heart rate which appeared to be sinus, however she trended back up into SVT rapidly. 12 mg of adensine administered with resolution. Ativan given for anxiety. Repeat EKG with sinus rhythm, however the PR interval is short at 92 ms. Concern for possible Parkinson White. We'll check labs and call cardiology. CXR normal. Cardiology states she will need a rule out and f/u with EP. Recommended starting metoprolol at 25 mg. Patient's 2 troponins normal. Stable for discharge.        Elwin Mocha, MD 04/07/12 2158

## 2012-04-07 NOTE — ED Notes (Signed)
Family at bedside. 

## 2012-04-07 NOTE — ED Provider Notes (Signed)
I saw and evaluated the patient, reviewed the resident's note and I agree with the findings and plan.  I have reviewed and agree with the findings as documented by the resident  Ethelda Chick, MD 04/07/12 2207

## 2012-04-07 NOTE — ED Notes (Signed)
Pt c/o chest pain, anxiety 30 mins ago after having stressful situation at work. Pt HR 260s, shob noted.

## 2012-04-09 ENCOUNTER — Ambulatory Visit (INDEPENDENT_AMBULATORY_CARE_PROVIDER_SITE_OTHER): Payer: 59 | Admitting: Internal Medicine

## 2012-04-09 ENCOUNTER — Encounter: Payer: Self-pay | Admitting: Internal Medicine

## 2012-04-09 ENCOUNTER — Encounter: Payer: Self-pay | Admitting: *Deleted

## 2012-04-09 VITALS — BP 90/60 | HR 71 | Ht 63.0 in | Wt 143.0 lb

## 2012-04-09 DIAGNOSIS — I471 Supraventricular tachycardia, unspecified: Secondary | ICD-10-CM | POA: Insufficient documentation

## 2012-04-09 DIAGNOSIS — I498 Other specified cardiac arrhythmias: Secondary | ICD-10-CM

## 2012-04-09 NOTE — Patient Instructions (Signed)
Start taking Metoprolol at night.  If you still feel badly, call and we will switch to Verapamil.  See instruction sheet for ablation.

## 2012-04-09 NOTE — Progress Notes (Signed)
Primary Care Physician: [facility] Referring Physician:  [facility]   [name] [name] is a 37 y.o. female with no significant past cardiac history who presents for evaluation of SVT.  She presented to [facility] 04/07/12 with very fast SVT.  She reports that she was sitting at her desk at work when she developed abrupt onset of tachypalpitations.  She reports associated SOB and dizziness.  She presented to [facility] and was found to have a short RP SVT at 266 bpm.  She received IV adenosine terminated tachycardia and she returned to baseline.  She was started on metoprolol succinate 25mg  daily.  She has not tolerated beta blocker therapy due to feeling very sleepy. She also reports associate dizziness with this medicine as well as a headache. She reports in retrospect that she has had similar tachypalpitations in the past.  Previously episodes last 3-4 minutes and will terminate with controlled breathing. These episodes occur once per week and have occurred for a year or so. Today, she denies symptoms of palpitations,exertional chest pain, shortness of breath, orthopnea, PND, lower extremity edema, dizziness, presyncope, syncope, or neurologic sequela. The patient is tolerating medications without difficulties and is otherwise without complaint today.   Past Medical History  Diagnosis Date  . Abnormal Pap smear 2004    colpo  . H/O candidiasis   . H/O varicella 2005  . Pre-eclampsia     H/O  . Pelvic pain in female 2007  . Candida vaginitis 05/2006  . IBS (irritable bowel syndrome)   . SVT (supraventricular tachycardia)    Past Surgical History  Procedure Date  . Tonsillectomy 1995  . Cholecystectomy     Current Outpatient Prescriptions  Medication Sig Dispense Refill  . metoprolol succinate (TOPROL-XL) 25 MG 24 hr tablet Take 1 tablet (25 mg total) by mouth daily.  30 tablet  0  . Norethindrone-Ethinyl Estradiol-Fe Biphas (LO LOESTRIN FE) 1 MG-10 MCG / 10 MCG tablet Take  1 tablet by mouth daily.  1 Package  11  . Norethindrone-Ethinyl Estradiol-Fe Biphas (LO LOESTRIN FE) 1 MG-10 MCG / 10 MCG tablet Take 1 tablet by mouth daily.  2 Package  0  . fluconazole (DIFLUCAN) 150 MG tablet TAKE ONE TABLET BY MOUTH NOW AND REPEAT EVERY 3 DAYS UNTIL COMPLETED  3 tablet  2    No Known Allergies  History   Social History  . Marital Status: Married    Spouse Name: N/A    Number of Children: N/A  . Years of Education: N/A   Occupational History  . Not on file.   Social History Main Topics  . Smoking status: Current Some Day Smoker  . Smokeless tobacco: Never Used  . Alcohol Use: Yes     Comment: 2 glasses of beer 3-4 times per week  . Drug Use: No  . Sexually Active: Yes -- Female partner(s)    Birth Control/ Protection: Pill     Comment: loloestrin fe   Other Topics Concern  . 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 Diabetes Mother   . Cancer Sister     Lump removed frfom shoulder  . Heart disease Maternal Grandfather     MI  . Cancer Paternal Grandmother     breast    ROS- All systems are reviewed and  negative except as per the HPI above  Physical Exam: Filed Vitals:   04/09/12 1526  BP: 90/60  Pulse: 71  Height: 5\' 3"  (1.6 m)  Weight: 143 lb (64.864 kg)    GEN- The patient is well appearing, alert and oriented x 3 today.   Head- normocephalic, atraumatic Eyes-  Sclera clear, conjunctiva pink Ears- hearing intact Oropharynx- clear Neck- supple, no JVP Lymph- no cervical lymphadenopathy Lungs- Clear to ausculation bilaterally, normal work of breathing Heart- Regular rate and rhythm, no murmurs, rubs or gallops, PMI not laterally displaced GI- soft, NT, ND, + BS Extremities- no clubbing, cyanosis, or edema MS- no significant deformity or atrophy Skin- no rash or lesion Psych- euthymic mood,  full affect Neuro- strength and sensation are intact  EKG today reveals sinus rhythm 71 bpm, PR 106 without obvious preexcitation, otherwise normal ekg ekg 04/07/12- short RP SVT at 266 bpm   Assessment and Plan:

## 2012-04-09 NOTE — Assessment & Plan Note (Signed)
The patient has adenosine sensitive short RP SVT for which she has been quite symptomatic.  She does not well tolerate metoprolol. I have instructed her to switch metoprolol to qpm.  If she continues to poorly tolerate this medicine then we could switch to verapamil 120mg  daily. Therapeutic strategies for supraventricular tachycardia including medicine and ablation were discussed in detail with the patient today. Risk, benefits, and alternatives to EP study and radiofrequency ablation were also discussed in detail today. These risks include but are not limited to stroke, bleeding, vascular damage, tamponade, perforation, damage to the heart and other structures, AV block requiring pacemaker, worsening renal function, and death. The patient understands these risk and wishes to proceed.  We will therefore proceed with catheter ablation at the next available time.

## 2012-04-17 ENCOUNTER — Telehealth: Payer: Self-pay | Admitting: Internal Medicine

## 2012-04-17 MED ORDER — VERAPAMIL HCL ER 120 MG PO CP24: 120.0000 mg | ORAL_CAPSULE | Freq: Every day | ORAL | Status: DC

## 2012-04-17 NOTE — Telephone Encounter (Signed)
Spoke with patient, she is still feeling terrible on the Metoprolol and would like to move up ablation.  I let her know that per Dr Amedeo Plenty dictation that we can switch the medication to Metoprolol and the earliest I could move her procedure to is 04/25/12.  I let her know that I called the medication in and will consult with Dr Johney Frame in regards to moving up her appoinment

## 2012-04-17 NOTE — Telephone Encounter (Signed)
Pt has procedure 2-18, beta blocker making her feel terrible, can she move this up sooner? pls call

## 2012-04-18 NOTE — Telephone Encounter (Signed)
Called patient back, lmom that we can not do the procedure earlier.  Hope she is tolerating the Verapamil better than Metoprolol

## 2012-04-22 ENCOUNTER — Other Ambulatory Visit (INDEPENDENT_AMBULATORY_CARE_PROVIDER_SITE_OTHER): Payer: 59

## 2012-04-22 ENCOUNTER — Other Ambulatory Visit: Payer: Self-pay | Admitting: *Deleted

## 2012-04-22 DIAGNOSIS — Z01818 Encounter for other preprocedural examination: Secondary | ICD-10-CM

## 2012-04-22 DIAGNOSIS — Z79899 Other long term (current) drug therapy: Secondary | ICD-10-CM

## 2012-04-22 LAB — CBC WITH DIFFERENTIAL/PLATELET
Basophils Relative: 0.7 % (ref 0.0–3.0)
Eosinophils Relative: 0.7 % (ref 0.0–5.0)
HCT: 35.8 % — ABNORMAL LOW (ref 36.0–46.0)
Hemoglobin: 12.2 g/dL (ref 12.0–15.0)
Lymphs Abs: 3.2 10*3/uL (ref 0.7–4.0)
MCV: 89.4 fl (ref 78.0–100.0)
Monocytes Absolute: 0.6 10*3/uL (ref 0.1–1.0)
Monocytes Relative: 7.2 % (ref 3.0–12.0)
Platelets: 301 10*3/uL (ref 150.0–400.0)
RBC: 4 Mil/uL (ref 3.87–5.11)
WBC: 8.6 10*3/uL (ref 4.5–10.5)

## 2012-04-22 LAB — BASIC METABOLIC PANEL
BUN: 7 mg/dL (ref 6–23)
CO2: 25 mEq/L (ref 19–32)
Calcium: 9 mg/dL (ref 8.4–10.5)
GFR: 97.33 mL/min (ref >60.00)
Glucose, Bld: 75 mg/dL (ref 70–99)
Sodium: 138 mEq/L (ref 135–145)

## 2012-04-22 LAB — PROTIME-INR: INR: 1 ratio (ref 0.8–1.0)

## 2012-04-23 ENCOUNTER — Encounter: Payer: 59 | Admitting: Internal Medicine

## 2012-04-25 ENCOUNTER — Encounter (HOSPITAL_COMMUNITY): Payer: Self-pay | Admitting: Pharmacy Technician

## 2012-04-28 MED ORDER — SODIUM CHLORIDE 0.9 % IV SOLN
INTRAVENOUS | Status: DC
  Administered 2012-04-29: 06:00:00 via INTRAVENOUS

## 2012-04-29 ENCOUNTER — Encounter (HOSPITAL_COMMUNITY)
Admission: RE | Disposition: A | Discharge status: Home / Self Care | Payer: Self-pay | Source: Ambulatory Visit | Attending: Internal Medicine

## 2012-04-29 ENCOUNTER — Encounter (HOSPITAL_COMMUNITY): Payer: Self-pay | Admitting: *Deleted

## 2012-04-29 ENCOUNTER — Encounter (HOSPITAL_COMMUNITY): Payer: Self-pay | Admitting: Anesthesiology

## 2012-04-29 ENCOUNTER — Observation Stay (HOSPITAL_COMMUNITY)
Admission: RE | Admit: 2012-04-29 | Discharge: 2012-05-01 | DRG: 243 | Disposition: A | Discharge status: Home / Self Care | Payer: 59 | Source: Ambulatory Visit | Attending: Internal Medicine | Admitting: Internal Medicine

## 2012-04-29 ENCOUNTER — Ambulatory Visit (HOSPITAL_COMMUNITY): Payer: 59 | Admitting: Anesthesiology

## 2012-04-29 DIAGNOSIS — R87619 Unspecified abnormal cytological findings in specimens from cervix uteri: Secondary | ICD-10-CM

## 2012-04-29 DIAGNOSIS — I471 Supraventricular tachycardia, unspecified: Principal | ICD-10-CM | POA: Diagnosis present

## 2012-04-29 DIAGNOSIS — IMO0002 Reserved for concepts with insufficient information to code with codable children: Secondary | ICD-10-CM

## 2012-04-29 DIAGNOSIS — Y838 Other surgical procedures as the cause of abnormal reaction of the patient, or of later complication, without mention of misadventure at the time of the procedure: Secondary | ICD-10-CM | POA: Diagnosis not present

## 2012-04-29 DIAGNOSIS — Z8619 Personal history of other infectious and parasitic diseases: Secondary | ICD-10-CM

## 2012-04-29 DIAGNOSIS — K589 Irritable bowel syndrome without diarrhea: Secondary | ICD-10-CM

## 2012-04-29 DIAGNOSIS — R51 Headache: Secondary | ICD-10-CM

## 2012-04-29 DIAGNOSIS — Z79899 Other long term (current) drug therapy: Secondary | ICD-10-CM

## 2012-04-29 DIAGNOSIS — R197 Diarrhea, unspecified: Secondary | ICD-10-CM

## 2012-04-29 DIAGNOSIS — I442 Atrioventricular block, complete: Secondary | ICD-10-CM | POA: Diagnosis not present

## 2012-04-29 DIAGNOSIS — N949 Unspecified condition associated with female genital organs and menstrual cycle: Secondary | ICD-10-CM | POA: Diagnosis present

## 2012-04-29 DIAGNOSIS — O1490 Unspecified pre-eclampsia, unspecified trimester: Secondary | ICD-10-CM

## 2012-04-29 DIAGNOSIS — I4719 Other supraventricular tachycardia: Secondary | ICD-10-CM

## 2012-04-29 DIAGNOSIS — I519 Heart disease, unspecified: Secondary | ICD-10-CM | POA: Diagnosis not present

## 2012-04-29 DIAGNOSIS — R110 Nausea: Secondary | ICD-10-CM

## 2012-04-29 DIAGNOSIS — F17200 Nicotine dependence, unspecified, uncomplicated: Secondary | ICD-10-CM | POA: Diagnosis present

## 2012-04-29 DIAGNOSIS — R109 Unspecified abdominal pain: Secondary | ICD-10-CM

## 2012-04-29 DIAGNOSIS — Z862 Personal history of diseases of the blood and blood-forming organs and certain disorders involving the immune mechanism: Secondary | ICD-10-CM

## 2012-04-29 HISTORY — DX: Supraventricular tachycardia: I47.1

## 2012-04-29 HISTORY — PX: SUPRAVENTRICULAR TACHYCARDIA ABLATION: SHX5492

## 2012-04-29 HISTORY — DX: Other supraventricular tachycardia: I47.19

## 2012-04-29 HISTORY — PX: OTHER SURGICAL HISTORY: SHX169

## 2012-04-29 LAB — HCG, SERUM, QUALITATIVE: Preg, Serum: NEGATIVE

## 2012-04-29 SURGERY — SUPRAVENTRICULAR TACHYCARDIA ABLATION
Anesthesia: Monitor Anesthesia Care

## 2012-04-29 MED ORDER — MIDAZOLAM HCL 5 MG/5ML IJ SOLN
INTRAMUSCULAR | Status: DC | PRN
  Administered 2012-04-29: 2 mg via INTRAVENOUS

## 2012-04-29 MED ORDER — ISOPROTERENOL HCL 0.2 MG/ML IJ SOLN
1000.0000 ug | INTRAVENOUS | Status: DC | PRN
  Administered 2012-04-29: 2 ug/min via INTRAVENOUS

## 2012-04-29 MED ORDER — BUPIVACAINE HCL (PF) 0.25 % IJ SOLN
INTRAMUSCULAR | Status: AC
  Filled 2012-04-29: qty 60

## 2012-04-29 MED ORDER — ALPRAZOLAM 0.25 MG PO TABS
ORAL_TABLET | ORAL | Status: AC
  Filled 2012-04-29: qty 4

## 2012-04-29 MED ORDER — SODIUM CHLORIDE 0.9 % IJ SOLN
3.0000 mL | Freq: Two times a day (BID) | INTRAMUSCULAR | Status: DC
  Administered 2012-04-29 – 2012-04-30 (×3): 3 mL via INTRAVENOUS

## 2012-04-29 MED ORDER — HYDROXYUREA 500 MG PO CAPS
ORAL_CAPSULE | ORAL | Status: AC
  Filled 2012-04-29: qty 1

## 2012-04-29 MED ORDER — OXYCODONE HCL 5 MG/5ML PO SOLN: 5.0000 mg | Freq: Once | ORAL | Status: DC | PRN

## 2012-04-29 MED ORDER — SODIUM CHLORIDE 0.9 % IV SOLN: 250.0000 mL | INTRAVENOUS | Status: DC | PRN

## 2012-04-29 MED ORDER — ACETAMINOPHEN 325 MG PO TABS: 650.0000 mg | ORAL_TABLET | ORAL | Status: DC | PRN

## 2012-04-29 MED ORDER — FENTANYL CITRATE 0.05 MG/ML IJ SOLN: 25.0000 ug | INTRAMUSCULAR | Status: DC | PRN

## 2012-04-29 MED ORDER — ALPRAZOLAM 0.5 MG PO TABS
1.0000 mg | ORAL_TABLET | Freq: Three times a day (TID) | ORAL | Status: DC | PRN
  Administered 2012-04-29 – 2012-04-30 (×5): 1 mg via ORAL
  Filled 2012-04-29 (×2): qty 2
  Filled 2012-04-29 (×2): qty 1

## 2012-04-29 MED ORDER — NORETHIN-ETH ESTRAD-FE BIPHAS 1 MG-10 MCG / 10 MCG PO TABS
1.0000 | ORAL_TABLET | Freq: Every day | ORAL | Status: DC
  Administered 2012-04-29 – 2012-04-30 (×2): 1 via ORAL

## 2012-04-29 MED ORDER — ONDANSETRON HCL 4 MG/2ML IJ SOLN: 4.0000 mg | Freq: Four times a day (QID) | INTRAMUSCULAR | Status: DC | PRN

## 2012-04-29 MED ORDER — LACTATED RINGERS IV SOLN
INTRAVENOUS | Status: DC | PRN
  Administered 2012-04-29: 07:00:00 via INTRAVENOUS

## 2012-04-29 MED ORDER — SODIUM CHLORIDE 0.9 % IJ SOLN: 3.0000 mL | INTRAMUSCULAR | Status: DC | PRN

## 2012-04-29 MED ORDER — OXYCODONE HCL 5 MG PO TABS
5.0000 mg | ORAL_TABLET | Freq: Four times a day (QID) | ORAL | Status: DC | PRN
  Administered 2012-04-29 – 2012-04-30 (×4): 5 mg via ORAL
  Filled 2012-04-29 (×3): qty 1

## 2012-04-29 MED ORDER — LIDOCAINE HCL (CARDIAC) 20 MG/ML IV SOLN
INTRAVENOUS | Status: DC | PRN
  Administered 2012-04-29: 50 mg via INTRAVENOUS

## 2012-04-29 MED ORDER — ONDANSETRON HCL 4 MG/2ML IJ SOLN
INTRAMUSCULAR | Status: DC | PRN
  Administered 2012-04-29: 4 mg via INTRAVENOUS

## 2012-04-29 MED ORDER — OXYCODONE HCL 5 MG PO TABS: 5.0000 mg | ORAL_TABLET | Freq: Once | ORAL | Status: DC | PRN

## 2012-04-29 MED ORDER — FENTANYL CITRATE 0.05 MG/ML IJ SOLN
INTRAMUSCULAR | Status: DC | PRN
  Administered 2012-04-29 (×2): 50 ug via INTRAVENOUS

## 2012-04-29 MED ORDER — METHYLPREDNISOLONE SODIUM SUCC 125 MG IJ SOLR
INTRAMUSCULAR | Status: DC | PRN
  Administered 2012-04-29: 125 mg via INTRAVENOUS

## 2012-04-29 MED ORDER — PROPOFOL INFUSION 10 MG/ML OPTIME
INTRAVENOUS | Status: DC | PRN
  Administered 2012-04-29: 50 ug/kg/min via INTRAVENOUS

## 2012-04-29 MED ORDER — HYDROCODONE-ACETAMINOPHEN 5-325 MG PO TABS
ORAL_TABLET | ORAL | Status: AC
  Filled 2012-04-29: qty 1

## 2012-04-29 MED ORDER — ISOPROTERENOL HCL 0.2 MG/ML IJ SOLN
1.0000 ug/min | INTRAVENOUS | Status: DC
  Administered 2012-04-29: 2 ug/min via INTRAVENOUS
  Filled 2012-04-29 (×2): qty 5

## 2012-04-29 MED ORDER — HYDROCODONE-ACETAMINOPHEN 5-325 MG PO TABS
1.0000 | ORAL_TABLET | ORAL | Status: DC | PRN
  Administered 2012-04-29 (×2): 1 via ORAL
  Administered 2012-04-30 – 2012-05-01 (×3): 2 via ORAL
  Filled 2012-04-29: qty 1
  Filled 2012-04-29 (×3): qty 2

## 2012-04-29 MED ORDER — DEXTROSE 5 % IV SOLN
INTRAVENOUS | Status: AC
  Filled 2012-04-29: qty 250

## 2012-04-29 NOTE — Progress Notes (Signed)
Utilization Review Completed.[name] [name] T2/18/2014  

## 2012-04-29 NOTE — Progress Notes (Signed)
Chaplain visited patient bin response to [facility] on-call referral. Pt was awake, alert and responsive. Pt's older sister was visiting with pt during my visit. Pt is being prepped for a heart procedure. Patient also expressed hopefulness in her recovery. Chaplain provided ministry of presence, empathic listening, and shared words of encouragement with pt. Chaplain will follow-up as needed. Patient and sister Liana Gerold for presence and spiritual care. Kelle Darting [phone]

## 2012-04-29 NOTE — Interval H&P Note (Signed)
History and Physical Interval Note:  04/29/2012 7:10 AM  [name] [name]  has presented today for surgery, with the diagnosis of SVT  The various methods of treatment have been discussed with the patient and family. After consideration of risks, benefits and other options for treatment, the patient has consented to  Procedure(s): SUPRAVENTRICULAR TACHYCARDIA ABLATION (N/A) as a surgical intervention .  The patient's history has been reviewed, patient examined, no change in status, stable for surgery.  I have reviewed the patient's chart and labs.  Questions were answered to the patient's satisfaction.     Hillis Range

## 2012-04-29 NOTE — Anesthesia Postprocedure Evaluation (Signed)
Anesthesia Post Note  Patient: [name] [name]  Procedure(s) Performed: Procedure(s) (LRB): SUPRAVENTRICULAR TACHYCARDIA ABLATION (N/A)  Anesthesia type: MAC  Patient location: PACU  Post pain: Pain level controlled and Adequate analgesia  Post assessment: Post-op Vital signs reviewed, Patient's Cardiovascular Status Stable and Respiratory Function Stable  Last Vitals:  Filed Vitals:   04/29/12 0611  BP: 120/75  Pulse: 98  Temp: 36.8 C  Resp: 18    Post vital signs: Reviewed and stable  Level of consciousness: awake, alert  and oriented  Complications: No apparent anesthesia complications

## 2012-04-29 NOTE — Op Note (Signed)
[name] [name], [name] NO.:  [id number]  MEDICAL RECORD NO.:  [id number]  LOCATION:  2902                         FACILITY:  [facility]  PHYSICIAN:  Hillis Range, MD       DATE OF BIRTH:  08/25/1975  DATE OF PROCEDURE: DATE OF DISCHARGE:                              OPERATIVE REPORT   PREPROCEDURE DIAGNOSIS:  Supraventricular tachycardia.  POSTPROCEDURE DIAGNOSIS: 1. Classic AV nodal reentrant tachycardia. 2. Iatrogenic complete heart block.  PROCEDURES: 1. Comprehensive EP study. 2. Coronary sinus pacing and recording. 3. Three-dimensional mapping of supraventricular tachycardia. 4. Radiofrequency ablation of supraventricular tachycardia. 5. Arrhythmia induction with isoproterenol infusion. 6. Temporary pacemaker wire placement.  INTRODUCTION:  Ms. [name] [name] is a very pleasant 37 year old female with recurrent symptomatic supraventricular tachycardia.  She has failed medical therapy with metoprolol and verapamil.  She has very rapidly conducting tachycardia with heart rates documented at 250 beats per minute.  She, therefore, presents today for EP study and radiofrequency ablation.  DESCRIPTION OF PROCEDURE:  Informed written consent was obtained and the patient was brought to the electrophysiology lab in the fasting state. She was adequately sedated with intravenous medications as outlined in the anesthesia report.  The patient's right neck and groin were prepped and draped in the usual sterile fashion by the EP lab staff.  Using a percutaneous Seldinger technique, one 6-French hemostasis sheath was placed into the right internal jugular vein.  A 6-French curved hexapolar Damato catheter was introduced through the right internal jugular vein and advanced into the coronary sinus for recording and pacing from this location.  Two 6-French and one 8-French hemostasis sheaths were placed in the right common femoral vein.  Two 6-French quadripolar Josephson  catheters were introduced through the right common femoral vein and advanced into the His bundle and right ventricular apex positions respectively.  The patient presented to the electrophysiology lab in sinus rhythm.  Her PR interval measured 130 milliseconds with a QRS duration of 81 milliseconds and a QT interval of 345 milliseconds. Her average RR interval measured 596 milliseconds.  Her AH interval measured 63 milliseconds with an HV interval of 37 milliseconds. Ventricular pacing was performed, which revealed midline VA conduction with a VA Wenckebach cycle length of 270 milliseconds and no arrhythmias observed.  Ventricular extrastimulus testing was performed, which revealed midline concentric decremental VA conduction with no retrograde jumps, echo beats, or tachycardias observed.  The ventricular ERP was 500/230 milliseconds.  Rapid atrial pacing was performed, which revealed frequent right bundle-branch aberration with pacing.  The AV Wenckebach cycle length was 250 milliseconds.  PR was much greater than RR, however, tachycardia was not observed.  Atrial extrastimulus testing was performed, which revealed decremental AV conduction with a prominent AH jump and rarely observed echo beat.  There was no sustained tachycardias, however.  The atrial ERP was 500/210 milliseconds. Isoproterenol was then infused at 2 mcg/min with a robust increase in heart rate observed.  Ventricular pacing was again performed, which again revealed midline concentric VA conduction with a VA Wenckebach cycle length of 260 milliseconds.  Ventricular extrastimulus testing was performed with a basic cycle length of 400 milliseconds, which revealed midline concentric decremental conduction with no retrograde jumps,  echo beats, or tachycardias observed.  Rapid atrial pacing was again performed and again the patient was observed to have right bundle-branch aberrancy with PR greater than RR, but no tachycardias  observed. Isoproterenol was then discontinued and allowed to wash out.  During isoproterenol washout, atrial extrastimulus testing was performed, which revealed decremental AV conduction, again with a prominent AH jump.  No arrhythmias, however, were observed.  Rapid atrial pacing was then continued down to a cycle length of 230 milliseconds with right bundle- branch aberrancy observed and PR greater than RR.  The patient then developed sustained tachycardia.  This was felt to represent her clinical tachycardia with one-to-one VA conduction with a VA time was 0 milliseconds and the earliest atrial activation recorded from the His electrogram.  The tachycardia cycle length was 245 msec. Ventricular pacing during tachycardia was performed, which revealed a VAV response.  PVCs were delivered during His refractoriness, which did not advance or affect the tachycardia.  I then elected to perform three-dimensional electroanatomical mapping of this tachycardia. A 7-French Biosense Webster 4-mm ablation catheter was introduced through the right common femoral vein and advanced into the right atrium.  Three-dimensional electroanatomical mapping was performed.  An activation map revealed that the earliest activation arose from the region of the AV node and was consistent with AV nodal reentrant tachycardia.  The tachycardia was terminated with ventricular pacing.  I elected to perform slow pathway modification.  Three-dimensional mapping of Koch's triangle was performed.  This demonstrated a rather small triangle.  A His cloud was generated to demarcate the position of the His.  Interestingly with catheter manipulation along Koch's triangle, the patient was observed to have transient AV block prior to any delivery of radiofrequency current.  This was always observed to be very transient and would stop quite quickly. It was, therefore, felt reasonable to ablate her tachycardia today.  The ablation  catheter was, therefore, positioned at site 7 in Koch's triangle.  A single radiofrequency application was delivered for 12 seconds in this location.  Accelerated junctional rhythm was observed initially with intact VA conduction.  The patient was observed to have dropped retrograde beat and RF was discontinued.  Unfortunately, the patient at this point was observed to have complete heart block and required ventricular pacing.  Her complete heart block persisted, however, upon cessation of ventricular pacing she was observed to have an escape of right bundle branch rhythm.  The HV interval was 37 milliseconds and no  infra-Hisian block was observed.  Isoproterenol was infused up to 5 mcg/minute without return of AV conduction. 125 mg of Solu-Medrol was also given.  Unfortunately complete heart block persisted.  The patient was observed for 30 minutes without return of AV conduction.  I, therefore, elected to place a temporary transvenous pacing wire through the right internal jugular vein for overnight observation in hopes that her AV conduction might return.  The procedure was therefore considered completed at this time.  The femoral catheters and sheaths were removed.  The right internal jugular sheath was retained and a temporary transvenous pacing wire was advanced into the right ventricular apex.  The device was programed to VVI at 35 beats per minute and the pacing threshold was 0.8 milliamps.  CONCLUSIONS: 1. Sinus rhythm upon presentation. 2. Easily inducible and near incessant classic AV nodal reentrant     tachycardia with a cycle length of 245 msec. 3. Successful ablation of the AV nodal reentrant tachycardia along the     slow pathway,  however, this was complicated by complete heart     block, which appears to be persistent at this point. 4. A temporary backup transvenous pacing wire is placed.  IV Solu-     Medrol has been administered.  The patient will be observed      overnight with IV Isuprel. 5. If her AV conduction does not return then we will consider     permanent pacing as an option.     Hillis Range, MD     JA/MEDQ  D:  04/29/2012  T:  04/29/2012  Job:  [id number]

## 2012-04-29 NOTE — Anesthesia Preprocedure Evaluation (Addendum)
Anesthesia Evaluation  Patient identified by MRN, date of birth, ID band Patient awake    Reviewed: Allergy & Precautions, H&P , NPO status , Patient's Chart, lab work & pertinent test results  Airway Mallampati: II TM Distance: >3 FB Neck ROM: full    Dental  (+) Teeth Intact and Dental Advidsory Given   Pulmonary Current Smoker,          Cardiovascular hypertension, On Medications + dysrhythmias Supra Ventricular Tachycardia     Neuro/Psych  Headaches,    GI/Hepatic   Endo/Other    Renal/GU      Musculoskeletal   Abdominal   Peds  Hematology   Anesthesia Other Findings   Reproductive/Obstetrics                          Anesthesia Physical Anesthesia Plan  ASA: II  Anesthesia Plan: MAC   Post-op Pain Management:    Induction: Intravenous  Airway Management Planned: Simple Face Mask  Additional Equipment:   Intra-op Plan:   Post-operative Plan:   Informed Consent: I have reviewed the patients History and Physical, chart, labs and discussed the procedure including the risks, benefits and alternatives for the proposed anesthesia with the patient or authorized representative who has indicated his/her understanding and acceptance.   Dental Advisory Given  Plan Discussed with: Anesthesiologist, CRNA and Surgeon  Anesthesia Plan Comments:        Anesthesia Quick Evaluation

## 2012-04-29 NOTE — Progress Notes (Signed)
CTSP for PVC's, heart block, innappropriate pacing. Pt feels a little 'jittery' on isoproterenol. No CP or dyspnea. Exam shows mild brady with 2/6 systolic murmur at LSB, lungs clear. Tele shows CHB with narrow-complex escape rhythm heart rate stable at 53 bpm. Occasional PVC's. I think patient is stable will continue to monitor closely Dr Johney Frame to reassess in the am.  [name] 04/29/2012 8:02 PM

## 2012-04-29 NOTE — Progress Notes (Signed)
MD paged on call for [facility]. Pt noted to not having pacing spikes, with high runs of frequent PVC's. Pt is also having hiccups at this time. VSS.

## 2012-04-29 NOTE — Brief Op Note (Signed)
04/29/2012  9:59 AM  PATIENT:  [name] [name]  37 y.o. female  PRE-OPERATIVE DIAGNOSIS:  SVT  POST-OPERATIVE DIAGNOSIS: AV nodal reentrant tachycardia,   PROCEDURE:  Procedure(s): SUPRAVENTRICULAR TACHYCARDIA ABLATION (N/A) Temporary pacemaker wire placement  See dictation for full report

## 2012-04-29 NOTE — H&P (View-Only) (Signed)
 Primary Care Physician: [facility] Referring Physician:  [facility]   [name] [name] is a 37 y.o. female with no significant past cardiac history who presents for evaluation of SVT.  She presented to [place] 04/07/12 with very fast SVT.  She reports that she was sitting at her desk at work when she developed abrupt onset of tachypalpitations.  She reports associated SOB and dizziness.  She presented to [facility] and was found to have a short RP SVT at 266 bpm.  She received IV adenosine terminated tachycardia and she returned to baseline.  She was started on metoprolol succinate 25mg daily.  She has not tolerated beta blocker therapy due to feeling very sleepy. She also reports associate dizziness with this medicine as well as a headache. She reports in retrospect that she has had similar tachypalpitations in the past.  Previously episodes last 3-4 minutes and will terminate with controlled breathing. These episodes occur once per week and have occurred for a year or so. Today, she denies symptoms of palpitations,exertional chest pain, shortness of breath, orthopnea, PND, lower extremity edema, dizziness, presyncope, syncope, or neurologic sequela. The patient is tolerating medications without difficulties and is otherwise without complaint today.   Past Medical History  Diagnosis Date  . Abnormal Pap smear 2004    colpo  . H/O candidiasis   . H/O varicella 2005  . Pre-eclampsia     H/O  . Pelvic pain in female 2007  . Candida vaginitis 05/2006  . IBS (irritable bowel syndrome)   . SVT (supraventricular tachycardia)    Past Surgical History  Procedure Date  . Tonsillectomy 1995  . Cholecystectomy     Current Outpatient Prescriptions  Medication Sig Dispense Refill  . metoprolol succinate (TOPROL-XL) 25 MG 24 hr tablet Take 1 tablet (25 mg total) by mouth daily.  30 tablet  0  . Norethindrone-Ethinyl Estradiol-Fe Biphas (LO LOESTRIN FE) 1 MG-10 MCG / 10 MCG tablet Take  1 tablet by mouth daily.  1 Package  11  . Norethindrone-Ethinyl Estradiol-Fe Biphas (LO LOESTRIN FE) 1 MG-10 MCG / 10 MCG tablet Take 1 tablet by mouth daily.  2 Package  0  . fluconazole (DIFLUCAN) 150 MG tablet TAKE ONE TABLET BY MOUTH NOW AND REPEAT EVERY 3 DAYS UNTIL COMPLETED  3 tablet  2    No Known Allergies  History   Social History  . Marital Status: Married    Spouse Name: N/A    Number of Children: N/A  . Years of Education: N/A   Occupational History  . Not on file.   Social History Main Topics  . Smoking status: Current Some Day Smoker  . Smokeless tobacco: Never Used  . Alcohol Use: Yes     Comment: 2 glasses of beer 3-4 times per week  . Drug Use: No  . Sexually Active: Yes -- Female partner(s)    Birth Control/ Protection: Pill     Comment: loloestrin fe   Other Topics Concern  . 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 Diabetes Mother   . Cancer Sister     Lump removed frfom shoulder  . Heart disease Maternal Grandfather     MI  . Cancer Paternal Grandmother     breast    ROS- All systems are reviewed and   negative except as per the HPI above  Physical Exam: Filed Vitals:   04/09/12 1526  BP: 90/60  Pulse: 71  Height: 5' 3" (1.6 m)  Weight: 143 lb (64.864 kg)    GEN- The patient is well appearing, alert and oriented x 3 today.   Head- normocephalic, atraumatic Eyes-  Sclera clear, conjunctiva pink Ears- hearing intact Oropharynx- clear Neck- supple, no JVP Lymph- no cervical lymphadenopathy Lungs- Clear to ausculation bilaterally, normal work of breathing Heart- Regular rate and rhythm, no murmurs, rubs or gallops, PMI not laterally displaced GI- soft, NT, ND, + BS Extremities- no clubbing, cyanosis, or edema MS- no significant deformity or atrophy Skin- no rash or lesion Psych- euthymic mood,  full affect Neuro- strength and sensation are intact  EKG today reveals sinus rhythm 71 bpm, PR 106 without obvious preexcitation, otherwise normal ekg ekg 04/07/12- short RP SVT at 266 bpm   Assessment and Plan:  

## 2012-04-29 NOTE — Preoperative (Signed)
Beta Blockers   Reason not to administer Beta Blockers:Not Applicable 

## 2012-04-29 NOTE — Transfer of Care (Signed)
Immediate Anesthesia Transfer of Care Note  Patient: [name] [name]  Procedure(s) Performed: Procedure(s): SUPRAVENTRICULAR TACHYCARDIA ABLATION (N/A)  Patient Location: Cath Lab  Anesthesia Type:MAC  Level of Consciousness: awake, alert  and oriented  Airway & Oxygen Therapy: Patient Spontanous Breathing and Patient connected to nasal cannula oxygen  Post-op Assessment: Report given to PACU RN, Post -op Vital signs reviewed and stable and Patient moving all extremities X 4  Post vital signs: Reviewed  Complications: No apparent anesthesia complications

## 2012-04-30 ENCOUNTER — Encounter (HOSPITAL_COMMUNITY)
Admission: RE | Disposition: A | Discharge status: Home / Self Care | Payer: Self-pay | Source: Ambulatory Visit | Attending: Internal Medicine

## 2012-04-30 DIAGNOSIS — I9789 Other postprocedural complications and disorders of the circulatory system, not elsewhere classified: Secondary | ICD-10-CM

## 2012-04-30 DIAGNOSIS — I442 Atrioventricular block, complete: Secondary | ICD-10-CM

## 2012-04-30 HISTORY — PX: PERMANENT PACEMAKER INSERTION: SHX5480

## 2012-04-30 HISTORY — PX: PACEMAKER INSERTION: SHX728

## 2012-04-30 HISTORY — DX: Atrioventricular block, complete: I97.89

## 2012-04-30 HISTORY — DX: Atrioventricular block, complete: I44.2

## 2012-04-30 SURGERY — PERMANENT PACEMAKER INSERTION
Anesthesia: LOCAL

## 2012-04-30 MED ORDER — CEFAZOLIN SODIUM-DEXTROSE 2-3 GM-% IV SOLR
2.0000 g | Freq: Four times a day (QID) | INTRAVENOUS | Status: AC
  Administered 2012-04-30 – 2012-05-01 (×3): 2 g via INTRAVENOUS
  Filled 2012-04-30 (×3): qty 50

## 2012-04-30 MED ORDER — OXYCODONE HCL 5 MG PO TABS
ORAL_TABLET | ORAL | Status: AC
  Filled 2012-04-30: qty 1

## 2012-04-30 MED ORDER — SODIUM CHLORIDE 0.9 % IV SOLN: 250.0000 mL | INTRAVENOUS | Status: DC

## 2012-04-30 MED ORDER — CEFAZOLIN SODIUM-DEXTROSE 2-3 GM-% IV SOLR
2.0000 g | INTRAVENOUS | Status: DC
  Filled 2012-04-30: qty 50

## 2012-04-30 MED ORDER — SODIUM CHLORIDE 0.9 % IJ SOLN: 3.0000 mL | Freq: Two times a day (BID) | INTRAMUSCULAR | Status: DC

## 2012-04-30 MED ORDER — MIDAZOLAM HCL 2 MG/2ML IJ SOLN
INTRAMUSCULAR | Status: AC
  Filled 2012-04-30: qty 2

## 2012-04-30 MED ORDER — SODIUM CHLORIDE 0.45 % IV SOLN: INTRAVENOUS | Status: DC

## 2012-04-30 MED ORDER — SODIUM CHLORIDE 0.9 % IJ SOLN: 3.0000 mL | INTRAMUSCULAR | Status: DC | PRN

## 2012-04-30 MED ORDER — ONDANSETRON HCL 4 MG/2ML IJ SOLN
INTRAMUSCULAR | Status: AC
  Filled 2012-04-30: qty 2

## 2012-04-30 MED ORDER — SODIUM CHLORIDE 0.9 % IR SOLN
80.0000 mg | Status: DC
  Filled 2012-04-30: qty 2

## 2012-04-30 MED ORDER — ALPRAZOLAM 0.25 MG PO TABS
ORAL_TABLET | ORAL | Status: AC
  Filled 2012-04-30: qty 4

## 2012-04-30 MED ORDER — FENTANYL CITRATE 0.05 MG/ML IJ SOLN
INTRAMUSCULAR | Status: AC
  Filled 2012-04-30: qty 2

## 2012-04-30 MED ORDER — CHLORHEXIDINE GLUCONATE 4 % EX LIQD
60.0000 mL | Freq: Once | CUTANEOUS | Status: DC
  Filled 2012-04-30: qty 60

## 2012-04-30 MED ORDER — CHLORHEXIDINE GLUCONATE 4 % EX LIQD: 60.0000 mL | Freq: Once | CUTANEOUS | Status: DC

## 2012-04-30 NOTE — Op Note (Signed)
[name] [name], [name] NO.:  [id number]  MEDICAL RECORD NO.:  [id number]  LOCATION:  2902                         FACILITY:  [facility]  PHYSICIAN:  Doylene Canning. Ladona Ridgel, MD    DATE OF BIRTH:  1975-06-14  DATE OF PROCEDURE:  04/30/2012 DATE OF DISCHARGE:                              OPERATIVE REPORT   PROCEDURE PERFORMED:  Insertion of a dual-chamber pacemaker.  INDICATION:  Symptomatic complete heart block.  INTRODUCTION:  The patient is a 37 year old woman with a history of SVT at rates up to 250 beats per minute.  She underwent catheter ablation one day ago which was complicated by the development of complete heart block.  She is now referred for insertion of dual-chamber pacemaker.  PROCEDURE:  After informed consent was obtained, the patient was taken to the diagnostic EP lab in a fasting state.  After usual preparation and draping, intravenous fentanyl and midazolam was given for sedation. A 30 mL of lidocaine was infiltrated into the left infraclavicular region.  A 5-cm incision was carried over this region, and electrocautery was utilized to dissect down to the fascial plane.  The left subclavian vein was punctured x2.  The Medtronic model 5076 52-cm active fixation pacing lead, serial number [id number] was advanced into the right ventricle.  The Medtronic 5076 45-cm pacing lead, serial number [id number] was attempted to be advanced into the right atrium but could not be traversed across the subclavian vein.  Venography of the left upper extremity venous system was then carried out demonstrating that the vein was very tortuous taking a Z curve which the pacing lead itself could not traverse.  The vein was re-punctured and a long sheath was advanced into the vein and with this long sheath the pacing lead could be advanced into the right atrium.  The serial number on the atrial lead was [id number].  Having accomplished this, the ventricular lead was maneuvered  into the right ventricle and mapping on the RV septum was carried out.  The R-waves measured 14 mV.  The pacing impedance was 800 ohms and threshold 0.8 V at 0.5 milliseconds.  10 V pacing did not stimulate the diaphragm.  With the right ventricular lead in satisfactory position, attention was then turned to placement of the atrial lead which was placed in  anterolateral portion of the right atrium where the P-waves measured 2.5 mV.  There was a large injury of current with active fixation lead and the pace impedance was 600 ohms, the threshold was a V at 0.5 milliseconds.  With the atrial and the ventricular leads in satisfactory position, they were secured to the subpectoral fascia with a figure-of-eight silk suture.  The sewing sleeve was secured with silk suture.  Electrocautery was utilized to make a subcutaneous pocket.  Antibiotic irrigation was utilized to irrigate the pocket, and electrocautery was utilized to assure hemostasis.  The Medtronic Adapta L dual-chamber pacemaker, serial number [id number] was connected to the atrial and ventricular leads and placed back in the subcutaneous pocket.  The pocket was irrigated with antibiotic irrigation.  The incision was closed with 2-0 and 3-0 Vicryl.  Benzoin and Steri-Strips were painted on the skin.  A pressure dressing was applied.  The patient was returned to her room in satisfactory condition.  COMPLICATIONS:  There were no immediate procedure complications.  RESULTS:  Demonstrate successful implantation of a Medtronic dual- chamber pacemaker in a patient with complete heart block.     Doylene Canning. Ladona Ridgel, MD     GWT/MEDQ  D:  04/30/2012  T:  04/30/2012  Job:  [id number]  cc:   Madolyn Frieze. Jens Som, MD, [facility]

## 2012-04-30 NOTE — Op Note (Signed)
DDD PPM inserted via the left subclavian vein without immediate complication. Z#[id number].

## 2012-04-30 NOTE — Progress Notes (Signed)
Subjective:  No chest pain or sob. Neck a little sore  Objective:  Vital Signs in the last 24 hours: Temp:  [98 F (36.7 C)-98.6 F (37 C)] 98.2 F (36.8 C) (02/19 0723) Pulse Rate:  [49-59] 49 (02/19 0300) Resp:  [16-23] 19 (02/19 0130) BP: (69-140)/(32-75) 124/75 mmHg (02/19 0300) SpO2:  [96 %-100 %] 98 % (02/19 0300) Weight:  [147 lb 4.3 oz (66.8 kg)] 147 lb 4.3 oz (66.8 kg) (02/18 1534)  Intake/Output from previous day: 02/18 0701 - 02/19 0700 In: 1164.3 [I.V.:1164.3] Out: 1300 [Urine:1300] Intake/Output from this shift:    Physical Exam: Well appearing NAD HEENT: Unremarkable Neck:  No JVD, no thyromegally, right IJ temp-perm PPM in place Lungs:  Clear with no wheezes HEART:  Regular rate rhythm, no murmurs, no rubs, no clicks Abd:  Flat, positive bowel sounds, no organomegally, no rebound, no guarding Ext:  2 plus pulses, no edema, no cyanosis, no clubbing Skin:  No rashes no nodules Neuro:  CN II through XII intact, motor grossly intact  Lab Results: No results found for this basename: WBC, HGB, PLT,  in the last 72 hours No results found for this basename: NA, K, CL, CO2, GLUCOSE, BUN, CREATININE,  in the last 72 hours No results found for this basename: TROPONINI, CK, MB,  in the last 72 hours Hepatic Function Panel No results found for this basename: PROT, ALBUMIN, AST, ALT, ALKPHOS, BILITOT, BILIDIR, IBILI,  in the last 72 hours No results found for this basename: CHOL,  in the last 72 hours No results found for this basename: PROTIME,  in the last 72 hours  Imaging: No results found.  Cardiac Studies: Tele - nsr with [facility] Assessment/Plan:  1. CHB after SVT ablation 2. S/p temp perm PM Rec: I have discussed the treatment options with the patient and Dr. Johney Frame. The risks/benefits/goals/expectations of PPM have been discussed with the patient and she wishes to proceed.  LOS: 1 day    Michelene Keniston,M.D. 04/30/2012, 8:13 AM

## 2012-04-30 NOTE — Progress Notes (Signed)
Allred MD gave telephone order to titrate isuprel to 0.5 mcg, then turn of lsuprel. Will continue to monitor closely. VSS.

## 2012-04-30 NOTE — Progress Notes (Signed)
PA for [facility] notified of pt status. MD reported and examined pt. No orders at this time. Will continue to monitor closely.

## 2012-04-30 NOTE — Interval H&P Note (Signed)
History and Physical Interval Note:  04/30/2012 8:17 AM  [name] [name]  has presented today for surgery, with the diagnosis of a  The various methods of treatment have been discussed with the patient and family. After consideration of risks, benefits and other options for treatment, the patient has consented to  Procedure(s): PERMANENT PACEMAKER INSERTION (N/A) as a surgical intervention .  The patient's history has been reviewed, patient examined, no change in status, stable for surgery.  I have reviewed the patient's chart and labs.  Questions were answered to the patient's satisfaction.     Lewayne Bunting

## 2012-04-30 NOTE — H&P (View-Only) (Signed)
Subjective:  No chest pain or sob. Neck a little sore  Objective:  Vital Signs in the last 24 hours: Temp:  [98 F (36.7 C)-98.6 F (37 C)] 98.2 F (36.8 C) (02/19 0723) Pulse Rate:  [49-59] 49 (02/19 0300) Resp:  [16-23] 19 (02/19 0130) BP: (69-140)/(32-75) 124/75 mmHg (02/19 0300) SpO2:  [96 %-100 %] 98 % (02/19 0300) Weight:  [147 lb 4.3 oz (66.8 kg)] 147 lb 4.3 oz (66.8 kg) (02/18 1534)  Intake/Output from previous day: 02/18 0701 - 02/19 0700 In: 1164.3 [I.V.:1164.3] Out: 1300 [Urine:1300] Intake/Output from this shift:    Physical Exam: Well appearing NAD HEENT: Unremarkable Neck:  No JVD, no thyromegally, right IJ temp-perm PPM in place Lungs:  Clear with no wheezes HEART:  Regular rate rhythm, no murmurs, no rubs, no clicks Abd:  Flat, positive bowel sounds, no organomegally, no rebound, no guarding Ext:  2 plus pulses, no edema, no cyanosis, no clubbing Skin:  No rashes no nodules Neuro:  CN II through XII intact, motor grossly intact  Lab Results: No results found for this basename: WBC, HGB, PLT,  in the last 72 hours No results found for this basename: NA, K, CL, CO2, GLUCOSE, BUN, CREATININE,  in the last 72 hours No results found for this basename: TROPONINI, CK, MB,  in the last 72 hours Hepatic Function Panel No results found for this basename: PROT, ALBUMIN, AST, ALT, ALKPHOS, BILITOT, BILIDIR, IBILI,  in the last 72 hours No results found for this basename: CHOL,  in the last 72 hours No results found for this basename: PROTIME,  in the last 72 hours  Imaging: No results found.  Cardiac Studies: Tele - nsr with CHB Assessment/Plan:  1. CHB after SVT ablation 2. S/p temp perm PM Rec: I have discussed the treatment options with the patient and Dr. Allred. The risks/benefits/goals/expectations of PPM have been discussed with the patient and she wishes to proceed.  LOS: 1 day    Jesilyn Easom,M.D. 04/30/2012, 8:13 AM    

## 2012-05-01 ENCOUNTER — Inpatient Hospital Stay (HOSPITAL_COMMUNITY): Payer: 59

## 2012-05-01 DIAGNOSIS — I442 Atrioventricular block, complete: Secondary | ICD-10-CM

## 2012-05-01 MED ORDER — HYDROCODONE-ACETAMINOPHEN 5-325 MG PO TABS: 1.0000 | ORAL_TABLET | Freq: Four times a day (QID) | ORAL | Status: DC | PRN

## 2012-05-01 NOTE — Discharge Summary (Signed)
ELECTROPHYSIOLOGY PROCEDURE DISCHARGE SUMMARY    Patient ID: [name] [name],  MRN: [id number], DOB/AGE: [birth year] 37 y.o.  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  IBS 2.  Pelvic pain  Procedures This Admission:  1.  Electrophysiology study and radiofrequency catheter ablation on 04-29-2012.  This demonstrated sinus rhythm upon presentation, easily inducible and near incessant classic AV nodal reentrant  tachycardia with a cycle length of 245 beats per minute, successful ablation of the AV nodal reentrant tachycardia along the slow pathway, however, this was complicated by complete heart block, which appears to be persistent at this point. A temporary backup transvenous pacing wire is placed. IV Solu- Medrol has been administered. The patient will be observed overnight with IV Isuprel. If her AV conduction does not return then we will consider permanent pacing as an option. 2.  Placement of a dual chamber pacemaker on 04-30-2012 by Dr Ladona Ridgel.  The patient received a Medtronic Adapta L pacemaker with model number 5076 right atrial and right ventricular leads.  There were no early apparent complications. 3.  CXR on 05-01-2012 demonstrated no pneumothorax status post pacemaker placement.   Brief HPI: [name] [name] is a 37 y.o. female with no significant past cardiac history who was referred for evaluation of SVT. She presented to [facility] 04/07/12 with very fast SVT. She reports that she was sitting at her desk at work when she developed abrupt onset of tachypalpitations. She reports associated SOB and dizziness. She presented to [facility] and was found to have a short RP SVT at 266 bpm. She received IV adenosine  terminated tachycardia and she returned to baseline. She was started on metoprolol succinate 25mg  daily. She has not tolerated beta blocker therapy due to feeling very sleepy. She also reports associate dizziness with this medicine as well as a headache.  She reports in retrospect that she has had similar tachypalpitations in the past. Previously episodes last 3-4 minutes and will terminate with controlled breathing. These episodes occur once per week and have occurred for a year or so.  Risks, benefits, and alternatives to ablation were reviewed with the patient who wished to proceed.   Hospital Course:  The patient was admitted on 04-29-2012 and underwent EPS and RFCA of AVNRT on 04-29-2012 with details as outlined above.  She was monitored on telemetry overnight without return of her AV conduction and so permanent pacemaker implantation was recommended.  This was carried out on 04-30-2012 without complication.  Telemetry demonstrated sinus rhythm with ventricular pacing.  Her left chest was without hematoma or ecchymosis.  CXR was obtained which demonstrated no pneumothorax.  Her device was interrogated and found to be functioning normally.  Dr Johney Frame examined the patient and considered her stable for discharge to home.   Discharge Vitals: Blood pressure 130/88, pulse 82, temperature 98.1 F (36.7 C), temperature source Oral, resp. rate 18, height 5\' 4"  (1.626 m), weight 147 lb 4.3 oz (66.8 kg), last menstrual period 04/22/2012, SpO2 100.00%.    Labs:   Lab Results  Component Value Date   WBC 8.6 04/22/2012   HGB 12.2 04/22/2012   HCT 35.8* 04/22/2012   MCV 89.4 04/22/2012   PLT 301.0 04/22/2012     Discharge Medications:    Medication List    STOP taking  these medications       verapamil 120 MG 24 hr capsule  Commonly known as:  VERELAN PM      TAKE these medications       ALPRAZolam 1 MG tablet  Commonly known as:  XANAX  Take 1 mg by mouth 3 (three) times daily as needed for anxiety.      BC HEADACHE POWDER PO  Take 1 packet by mouth daily as needed (for headaches).     HYDROcodone-acetaminophen 5-325 MG per tablet  Commonly known as:  NORCO/VICODIN  Take 1 tablet by mouth every 6 (six) hours as needed for pain.     LO LOESTRIN FE 1 MG-10 MCG / 10 MCG tablet  Generic drug:  Norethindrone-Ethinyl Estradiol-Fe Biphas  Take 1 tablet by mouth daily.        Disposition:  Discharge Orders   Future Appointments Provider Department Dept Phone   05/12/2012 9:00 AM Hillis Range, MD [facility] Main Office [place]) [phone]   06/13/2012 9:00 AM Hillis Range, MD [facility] Main Office [place]) [phone]   Future Orders Complete By Expires     Diet - low sodium heart healthy  As directed     Discharge instructions  As directed     Comments:      Please see post pacemaker implant discharge instructions.    Increase activity slowly  As directed       Follow-up Information   Follow up with Hillis Range, MD On 05/12/2012. (At 9:00 AM for wound check)    Contact information:   [address] [place] [address] [phone]      Follow up with Hillis Range, MD On 06/13/2012. (At 9:00 AM)    Contact information:   [address] [place] [address] [phone]      Duration of Discharge Encounter: Greater than 30 minutes including physician time.  Hillis Range, MD

## 2012-05-01 NOTE — Progress Notes (Signed)
   ELECTROPHYSIOLOGY ROUNDING NOTE    Patient Name: [name] [name] Date of Encounter: 05-01-2012    SUBJECTIVE:Patient without chest pain or shortness of breath.  Moderate incisional soreness.  S/p PPM implant 04-30-2012.  TELEMETRY: Reviewed telemetry pt in sinus rhythm with ventricular pacing, occasional PVC's Physical Exam: Filed Vitals:   05/01/12 0400 05/01/12 0500 05/01/12 0600 05/01/12 0700  BP: 112/75 130/82 122/91 130/88  Pulse: 72 73 77 82  Temp: 98.1 F (36.7 C)     TempSrc: Oral   Oral  Resp: 16 16 20 18   Height:      Weight:      SpO2: 97% 97% 99% 100%    GEN- The patient is well appearing, alert and oriented x 3 today.   Head- normocephalic, atraumatic Eyes-  Sclera clear, conjunctiva pink Ears- hearing intact Oropharynx- clear Neck- supple  Lungs- Clear to ausculation bilaterally, normal work of breathing Heart- Regular rate and rhythm, no murmurs, rubs or gallops, PMI not laterally displaced GI- soft, NT, ND, + BS Extremities- no clubbing, cyanosis, or edema MS- no significant deformity or atrophy Skin- pacemaker site is without hematoma Neuro- strength and sensation are intact  Radiology/Studies:  No ptx, stable leads  DEVICE INTERROGATION: Reviewed and normal   Assessment and Plan:  1. SVT S/p ablation Stop verapamil  2. CHB Doing well s/p PPM Wound care, arm mobility, restrictions reviewed with patient.    Routine follow up scheduled (10 day wound check with me)  Fayrene Fearing Johannah Rozas,MD

## 2012-05-08 ENCOUNTER — Ambulatory Visit: Payer: 59

## 2012-05-09 ENCOUNTER — Telehealth: Payer: Self-pay | Admitting: Internal Medicine

## 2012-05-09 NOTE — Telephone Encounter (Signed)
New  Problem   Pt had pacemaker put in on 04/30/12 and is having left hand numbness,swelling and left arm sorness. Tingling in fingers. No chest pain.

## 2012-05-09 NOTE — Telephone Encounter (Signed)
Hand feels numb and tingling,just started last night.  The whole arm feels weird describes are as "heavy" but lower feels tingling Discussed with Dr Johney Frame, have her elevate her arm over the weekend.  If it is still like this on Mon we will get a Left upper extremity venous doppler to r/o DVT s/p PPM implant

## 2012-05-09 NOTE — Telephone Encounter (Signed)
Left patient a message to call me and have me over head paged to discuss symptoms

## 2012-05-12 ENCOUNTER — Ambulatory Visit (INDEPENDENT_AMBULATORY_CARE_PROVIDER_SITE_OTHER): Payer: 59 | Admitting: Internal Medicine

## 2012-05-12 ENCOUNTER — Encounter: Payer: Self-pay | Admitting: Internal Medicine

## 2012-05-12 VITALS — BP 120/72 | HR 70 | Wt 147.0 lb

## 2012-05-12 DIAGNOSIS — I498 Other specified cardiac arrhythmias: Secondary | ICD-10-CM

## 2012-05-12 DIAGNOSIS — I471 Supraventricular tachycardia: Secondary | ICD-10-CM

## 2012-05-12 LAB — PACEMAKER DEVICE OBSERVATION
ATRIAL PACING PM: 0.9
RV LEAD IMPEDENCE PM: 669 Ohm
VENTRICULAR PACING PM: 99.9

## 2012-05-12 MED ORDER — VERAPAMIL HCL ER 120 MG PO TBCR: 120.0000 mg | EXTENDED_RELEASE_TABLET | Freq: Every day | ORAL | Status: DC

## 2012-05-12 MED ORDER — FUROSEMIDE 20 MG PO TABS: 20.0000 mg | ORAL_TABLET | Freq: Every day | ORAL | Status: DC

## 2012-05-12 NOTE — Patient Instructions (Signed)
Your physician has recommended you make the following change in your medication: Take Lasix 20mg  1 tablet daily for 5 days.  Restart Verapamil 120mg  1 tablet daily.  Follow up with Dr Johney Frame Monday, March 10th at 8:45AM.

## 2012-05-12 NOTE — Progress Notes (Signed)
[name] [name] is a 37 y.o. female who presents today for electrophysiology followup.   She reports fatigue and weight gain since her pacemaker was implanted.  She has had mild swelling in her hands L>R.  She also notices her heart pounding at times, particular with activity. Today, she denies symptoms of chest pain, shortness of breath, fevers, chills, , dizziness, presyncope, or syncope.  The patient is otherwise without complaint today.   Past Medical History  Diagnosis Date  . Abnormal Pap smear 2004    colpo  . H/O candidiasis   . H/O varicella 2005  . Pre-eclampsia     H/O  . Pelvic pain in female 2007  . Candida vaginitis 05/2006  . IBS (irritable bowel syndrome)   . 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 Tonsillectomy  1995  . Cholecystectomy    . Ep study and ablation  04/29/12    slow pathway ablation by Dr Johney Frame   . Pacemaker insertion  04/30/12    Medtronic Adapta L implanted by Dr Ladona Ridgel for complete heart block    Current Outpatient Prescriptions  Medication Sig Dispense Refill  . ALPRAZolam (XANAX) 1 MG tablet Take 1 mg by mouth 3 (three) times daily as needed for anxiety.      . Aspirin-Salicylamide-Caffeine (BC HEADACHE POWDER PO) Take 1 packet by mouth daily as needed (for headaches).      . furosemide (LASIX) 20 MG tablet Take 1 tablet (20 mg total) by mouth daily.  5 tablet  0  . HYDROcodone-acetaminophen (NORCO/VICODIN) 5-325 MG per tablet Take 1 tablet by mouth every 6 (six) hours as needed for pain.  10 tablet  0  . Norethindrone-Ethinyl Estradiol-Fe Biphas (LO LOESTRIN FE) 1 MG-10 MCG / 10 MCG tablet Take 1 tablet by mouth daily.      . verapamil (CALAN-SR) 120 MG CR tablet Take 1 tablet (120 mg total) by mouth at bedtime.  30 tablet  11   No current facility-administered medications for this  visit.    Physical Exam: Filed Vitals:   05/12/12 0924  BP: 120/72  Pulse: 70  Weight: 147 lb (66.679 kg)    GEN- The patient is anxious appearing, alert and oriented x 3 today.   Head- normocephalic, atraumatic Eyes-  Sclera clear, conjunctiva pink Ears- hearing intact Oropharynx- clear Lungs- Clear to ausculation bilaterally, normal work of breathing Chest- pacemaker pocket is well healed Heart- Regular rate and rhythm, no murmurs, rubs or gallops, PMI not laterally displaced GI- soft, NT, ND, + BS Extremities- no clubbing, cyanosis, or edema  Pacemaker interrogation- reviewed in detail today,  See PACEART report  Assessment and Plan:  1. Complete heart block Normal pacemaker function See Pace Art report No changes today  2. Palpitations She reports forceful heart beat.  I suspect that this will continue to improve with time.  For now, I will resume verapamil.  Hopefully she will improve and then we can gradually wean this medicine.  3. Mild weight gain and swelling in L>R hands Will treat with lasix 20mg  daily x 5 days.  Return for assessment by me next week. Consider echo if not improved.  I have encouraged her to increase activity and become more active at this time

## 2012-05-19 ENCOUNTER — Encounter: Payer: Self-pay | Admitting: Internal Medicine

## 2012-05-19 ENCOUNTER — Ambulatory Visit (INDEPENDENT_AMBULATORY_CARE_PROVIDER_SITE_OTHER): Payer: 59 | Admitting: Internal Medicine

## 2012-05-19 VITALS — BP 111/75 | HR 72 | Ht 64.0 in | Wt 148.1 lb

## 2012-05-19 DIAGNOSIS — I442 Atrioventricular block, complete: Secondary | ICD-10-CM

## 2012-05-19 DIAGNOSIS — I498 Other specified cardiac arrhythmias: Secondary | ICD-10-CM

## 2012-05-19 DIAGNOSIS — I471 Supraventricular tachycardia: Secondary | ICD-10-CM

## 2012-05-19 LAB — BASIC METABOLIC PANEL
BUN: 10 mg/dL (ref 6–23)
CO2: 23 mEq/L (ref 19–32)
Chloride: 107 mEq/L (ref 96–112)
Creatinine, Ser: 0.8 mg/dL (ref 0.4–1.2)
Glucose, Bld: 91 mg/dL (ref 70–99)
Potassium: 4.3 mEq/L (ref 3.5–5.1)

## 2012-05-19 LAB — PACEMAKER DEVICE OBSERVATION: ATRIAL PACING PM: 0.7

## 2012-05-19 NOTE — Progress Notes (Signed)
[name] [name] is a 37 y.o. female who presents today for electrophysiology followup.   Her fatigue has much improved.  Her weight is stable  She continues to have only mild swelling. This improved with lasix, though she did have some cramping.  Her exercise tolerance is improved.  Heart pounding is better. Today, she denies symptoms of chest pain, shortness of breath, fevers, chills, , dizziness, presyncope, or syncope.  The patient is otherwise without complaint today.   Past Medical History  Diagnosis Date  . Abnormal Pap smear 2004    colpo  . H/O candidiasis   . H/O varicella 2005  . Pre-eclampsia     H/O  . Pelvic pain in female 2007  . Candida vaginitis 05/2006  . IBS (irritable bowel syndrome)   . 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 Tonsillectomy  1995  . Cholecystectomy    . Ep study and ablation  04/29/12    slow pathway ablation by Dr Johney Frame   . Pacemaker insertion  04/30/12    Medtronic Adapta L implanted by Dr Ladona Ridgel for complete heart block    Current Outpatient Prescriptions  Medication Sig Dispense Refill  . ALPRAZolam (XANAX) 1 MG tablet Take 1 mg by mouth 3 (three) times daily as needed for anxiety.      . Aspirin-Salicylamide-Caffeine (BC HEADACHE POWDER PO) Take 1 packet by mouth daily as needed (for headaches).      . furosemide (LASIX) 20 MG tablet Take 1 tablet (20 mg total) by mouth daily.  5 tablet  0  . HYDROcodone-acetaminophen (NORCO/VICODIN) 5-325 MG per tablet Take 1 tablet by mouth every 6 (six) hours as needed for pain.  10 tablet  0  . Norethindrone-Ethinyl Estradiol-Fe Biphas (LO LOESTRIN FE) 1 MG-10 MCG / 10 MCG tablet Take 1 tablet by mouth daily.      . verapamil (CALAN-SR) 120 MG CR tablet Take 1 tablet (120 mg total) by mouth at bedtime.  30 tablet  11   No current  facility-administered medications for this visit.    Physical Exam: Filed Vitals:   05/19/12 0919  BP: 111/75  Pulse: 72  Height: 5\' 4"  (1.626 m)  Weight: 148 lb 1.9 oz (67.187 kg)    GEN- The patient is anxious appearing, alert and oriented x 3 today.   Head- normocephalic, atraumatic Eyes-  Sclera clear, conjunctiva pink Ears- hearing intact Oropharynx- clear Lungs- Clear to ausculation bilaterally, normal work of breathing Chest- pacemaker pocket is well healed Heart- Regular rate and rhythm, no murmurs, rubs or gallops, PMI not laterally displaced GI- soft, NT, ND, + BS Extremities- no clubbing, cyanosis, or edema  Pacemaker interrogation- reviewed in detail today,  See PACEART report  Assessment and Plan:  1. Complete heart block Normal pacemaker function See Pace Art report AV delays are shortened today to promote better AV synchrony (PR in sinus prior to ablation was quite short)  2. Palpitations Much improved with verapamil.  Hopefully we can wean verapamil eventually  3. Swelling Improved with lasix Check BMET today Echo upon return  I have encouraged her to increase activity and become more active at this time.  Return in 1-2 weeks

## 2012-05-19 NOTE — Patient Instructions (Addendum)
Your physician recommends that you schedule a follow-up appointment in: 10 days with Dr Johney Frame  Your physician recommends that you return for lab work today: BMP/Mag  Your physician has requested that you have an echocardiogram. Echocardiography is a painless test that uses sound waves to create images of your heart. It provides your doctor with information about the size and shape of your heart and how well your heart's chambers and valves are working. This procedure takes approximately one hour. There are no restrictions for this procedure.

## 2012-05-22 ENCOUNTER — Ambulatory Visit (HOSPITAL_COMMUNITY): Payer: 59 | Attending: Cardiology | Admitting: Radiology

## 2012-05-22 DIAGNOSIS — I498 Other specified cardiac arrhythmias: Secondary | ICD-10-CM

## 2012-05-22 DIAGNOSIS — I079 Rheumatic tricuspid valve disease, unspecified: Secondary | ICD-10-CM | POA: Insufficient documentation

## 2012-05-22 DIAGNOSIS — I471 Supraventricular tachycardia, unspecified: Secondary | ICD-10-CM | POA: Insufficient documentation

## 2012-05-22 DIAGNOSIS — I442 Atrioventricular block, complete: Secondary | ICD-10-CM

## 2012-05-22 DIAGNOSIS — Z950 Presence of cardiac pacemaker: Secondary | ICD-10-CM | POA: Insufficient documentation

## 2012-05-22 NOTE — Progress Notes (Signed)
Echocardiogram performed.  

## 2012-05-26 ENCOUNTER — Ambulatory Visit (INDEPENDENT_AMBULATORY_CARE_PROVIDER_SITE_OTHER): Payer: 59 | Admitting: Internal Medicine

## 2012-05-26 ENCOUNTER — Encounter: Payer: Self-pay | Admitting: Internal Medicine

## 2012-05-26 VITALS — BP 111/83 | HR 92 | Wt 150.0 lb

## 2012-05-26 DIAGNOSIS — I442 Atrioventricular block, complete: Secondary | ICD-10-CM

## 2012-05-26 MED ORDER — VERAPAMIL HCL ER 120 MG PO TBCR: EXTENDED_RELEASE_TABLET | ORAL | Status: DC

## 2012-05-26 NOTE — Patient Instructions (Signed)
Your physician recommends that you schedule a follow-up appointment as scheduled   Your physician has recommended you make the following change in your medication:  1) Decrease Verapamil to 60mg  daily

## 2012-05-30 ENCOUNTER — Inpatient Hospital Stay (HOSPITAL_BASED_OUTPATIENT_CLINIC_OR_DEPARTMENT_OTHER)
Admission: EM | Admit: 2012-05-30 | Discharge: 2012-05-31 | DRG: 315 | Disposition: A | Discharge status: Home / Self Care | Payer: 59 | Attending: Internal Medicine | Admitting: Internal Medicine

## 2012-05-30 ENCOUNTER — Encounter (HOSPITAL_BASED_OUTPATIENT_CLINIC_OR_DEPARTMENT_OTHER): Payer: Self-pay | Admitting: *Deleted

## 2012-05-30 DIAGNOSIS — K589 Irritable bowel syndrome without diarrhea: Secondary | ICD-10-CM | POA: Diagnosis present

## 2012-05-30 DIAGNOSIS — M7989 Other specified soft tissue disorders: Secondary | ICD-10-CM

## 2012-05-30 DIAGNOSIS — I871 Compression of vein: Secondary | ICD-10-CM | POA: Diagnosis present

## 2012-05-30 DIAGNOSIS — Y831 Surgical operation with implant of artificial internal device as the cause of abnormal reaction of the patient, or of later complication, without mention of misadventure at the time of the procedure: Secondary | ICD-10-CM | POA: Diagnosis present

## 2012-05-30 DIAGNOSIS — T82897A Other specified complication of cardiac prosthetic devices, implants and grafts, initial encounter: Principal | ICD-10-CM | POA: Diagnosis present

## 2012-05-30 DIAGNOSIS — R200 Anesthesia of skin: Secondary | ICD-10-CM

## 2012-05-30 NOTE — ED Notes (Signed)
Pt reports left arm pain, numbness, and discoloration x 1 day- pulse present- pacemaker placed on feb 19

## 2012-05-31 ENCOUNTER — Emergency Department (HOSPITAL_BASED_OUTPATIENT_CLINIC_OR_DEPARTMENT_OTHER): Payer: 59

## 2012-05-31 DIAGNOSIS — M7989 Other specified soft tissue disorders: Secondary | ICD-10-CM

## 2012-05-31 LAB — BASIC METABOLIC PANEL
BUN: 6 mg/dL (ref 6–23)
BUN: 6 mg/dL (ref 6–23)
CO2: 20 mEq/L (ref 19–32)
CO2: 23 mEq/L (ref 19–32)
Calcium: 9.7 mg/dL (ref 8.4–10.5)
Chloride: 105 mEq/L (ref 96–112)
Chloride: 106 mEq/L (ref 96–112)
Creatinine, Ser: 0.6 mg/dL (ref 0.50–1.10)
Creatinine, Ser: 0.6 mg/dL (ref 0.50–1.10)
GFR calc Af Amer: >90 mL/min (ref >90)
Glucose, Bld: 77 mg/dL (ref 70–99)

## 2012-05-31 LAB — CBC WITH DIFFERENTIAL/PLATELET
Basophils Absolute: 0.1 10*3/uL (ref 0.0–0.1)
Basophils Relative: 1 % (ref 0–1)
Eosinophils Relative: 4 % (ref 0–5)
HCT: 36.5 % (ref 36.0–46.0)
Hemoglobin: 12.1 g/dL (ref 12.0–15.0)
MCHC: 33.2 g/dL (ref 30.0–36.0)
MCV: 91 fL (ref 78.0–100.0)
Monocytes Absolute: 0.7 10*3/uL (ref 0.1–1.0)
Monocytes Relative: 7 % (ref 3–12)
RDW: 12.9 % (ref 11.5–15.5)

## 2012-05-31 LAB — PROTIME-INR: Prothrombin Time: 12.3 seconds (ref 11.6–15.2)

## 2012-05-31 LAB — CBC
HCT: 34.3 % — ABNORMAL LOW (ref 36.0–46.0)
MCV: 89.6 fL (ref 78.0–100.0)
RBC: 3.83 MIL/uL — ABNORMAL LOW (ref 3.87–5.11)
WBC: 9.7 10*3/uL (ref 4.0–10.5)

## 2012-05-31 MED ORDER — HEPARIN (PORCINE) IN NACL 100-0.45 UNIT/ML-% IJ SOLN
INTRAMUSCULAR | Status: AC
  Administered 2012-05-31: 1100 [IU]/h via INTRAVENOUS
  Filled 2012-05-31: qty 250

## 2012-05-31 MED ORDER — HEPARIN SODIUM (PORCINE) 5000 UNIT/ML IJ SOLN
INTRAMUSCULAR | Status: AC
  Filled 2012-05-31: qty 1

## 2012-05-31 MED ORDER — FUROSEMIDE 20 MG PO TABS: 20.0000 mg | ORAL_TABLET | Freq: Every day | ORAL | Status: DC | PRN

## 2012-05-31 MED ORDER — MORPHINE SULFATE 2 MG/ML IJ SOLN
2.0000 mg | Freq: Once | INTRAMUSCULAR | Status: AC
  Administered 2012-05-31: 2 mg via INTRAVENOUS
  Filled 2012-05-31: qty 1

## 2012-05-31 MED ORDER — HEPARIN BOLUS VIA INFUSION
4000.0000 [IU] | Freq: Once | INTRAVENOUS | Status: AC
  Administered 2012-05-31: 4000 [IU] via INTRAVENOUS

## 2012-05-31 MED ORDER — ALPRAZOLAM 0.5 MG PO TABS
1.0000 mg | ORAL_TABLET | Freq: Three times a day (TID) | ORAL | Status: DC | PRN
  Administered 2012-05-31: 1 mg via ORAL
  Filled 2012-05-31: qty 2

## 2012-05-31 MED ORDER — ONDANSETRON HCL 4 MG/2ML IJ SOLN: 4.0000 mg | Freq: Four times a day (QID) | INTRAMUSCULAR | Status: DC | PRN

## 2012-05-31 MED ORDER — ACETAMINOPHEN 325 MG PO TABS: 650.0000 mg | ORAL_TABLET | ORAL | Status: DC | PRN

## 2012-05-31 MED ORDER — HEPARIN (PORCINE) IN NACL 100-0.45 UNIT/ML-% IJ SOLN
1100.0000 [IU]/h | INTRAMUSCULAR | Status: DC
  Filled 2012-05-31 (×2): qty 250

## 2012-05-31 MED ORDER — VERAPAMIL HCL 40 MG PO TABS
20.0000 mg | ORAL_TABLET | Freq: Three times a day (TID) | ORAL | Status: DC
  Administered 2012-05-31 (×2): 20 mg via ORAL
  Filled 2012-05-31 (×4): qty 1

## 2012-05-31 MED ORDER — FENTANYL CITRATE 0.05 MG/ML IJ SOLN
50.0000 ug | Freq: Once | INTRAMUSCULAR | Status: AC
  Administered 2012-05-31: 50 ug via INTRAVENOUS
  Filled 2012-05-31: qty 2

## 2012-05-31 MED ORDER — FUROSEMIDE 20 MG PO TABS
20.0000 mg | ORAL_TABLET | Freq: Every day | ORAL | Status: DC
  Administered 2012-05-31: 20 mg via ORAL
  Filled 2012-05-31: qty 1

## 2012-05-31 MED ORDER — VERAPAMIL HCL ER 120 MG PO TBCR
60.0000 mg | EXTENDED_RELEASE_TABLET | Freq: Every day | ORAL | Status: DC
  Filled 2012-05-31: qty 0.5

## 2012-05-31 NOTE — Discharge Summary (Addendum)
Discharge Summary   Patient ID: [name] [name] MRN: [id number], DOB/AGE: 10/13/1975 37 y.o. Admit date: 05/30/2012 D/C date:     05/31/2012  Primary Cardiologist: Allred  Primary Discharge Diagnoses:  1. Left upper extremity swelling felt secondary to venous stenosis 2. AVNRT s/p ablation this admission resulting in iatrogenic heart block requiring pacemaker insertion   Secondary Discharge Diagnoses:  1. IBS  2. Pelvic pain  Hospital Course: [name] is a 37 y/o F who underwent SVT ablation on 04/29/2012 complicated by complete heart block requiring pacemaker implantation on 04/30/2012. Since then she has had some left arm swelling that remains controlled as long as she keeps her arm elevated. She has had some fatigue and dyspnea on exertion. She denied chest pain. By the end of the day prior to admission, she had significant swelling of the left arm with purlplish discoloration of her left palm extending to the forearm.  The ED physician discussed the case with vascular surgery who suggested venogram. Her arm was kept elevated, iced, and she was put on heparin drip. Her symptoms improved remarkably and she was transferred to [facility] for further eval. CXR was nonacute. She was not tachycardic, tachypnic or hypoxic. LUE doppler demonstrated no evidence of DVT, cannot exclude venous stenosis. Dr. Jens Som discussed the case with Dr. Johney Frame who felt her presentation was more likely consistent with venous stenosis following pacemaker placement - she apparently has had some of the symptoms since her pacemaker was placed and he has evaluated her for this. She has previously been given limited prescription for Lasix with relief. She was discharged with voicemail left on scheduling line for closer OP f/u with Dr. Johney Frame. Dr. Jens Som has recommended discharge today on home verapamil, PRN Lasix rx, and OK to continue oral contraceptive. The patient's results were discussed with her and her husband.  Dr. Jens Som has seen and examined the patient today and feels she is stable for discharge, with return to normal activities.  Discharge Vitals: Blood pressure 105/64, pulse 84, temperature 98.4 F (36.9 C), temperature source Oral, resp. rate 17, height 5\' 4"  (1.626 m), weight 150 lb (68.04 kg), last menstrual period 05/27/2012, SpO2 98.00%.  Labs: Lab Results  Component Value Date   WBC 9.7 05/31/2012   HGB 11.6* 05/31/2012   HCT 34.3* 05/31/2012   MCV 89.6 05/31/2012   PLT 254 05/31/2012    Recent Labs Lab 05/31/12 0600  NA 141  K 4.0  CL 105  CO2 20  BUN 6  CREATININE 0.60  CALCIUM 9.0  GLUCOSE 77    Recent Labs  05/31/12 0023  TROPONINI <0.30    Diagnostic Studies/Procedures   Dg Chest 2 View 05/31/2012  *RADIOLOGY REPORT*  Clinical Data: Shortness of breath and arm pain.  CHEST - 2 VIEW  Comparison: 05/01/2012 and 01/16/2009 chest radiographs  Findings: The cardiomediastinal silhouette is unremarkable. A left-sided pacemaker with leads overlying the right atrium and right ventricle again noted. There is no evidence of focal airspace disease, pulmonary edema, suspicious pulmonary nodule/mass, pleural effusion, or pneumothorax. No acute bony abnormalities are identified.  IMPRESSION: No evidence of active cardiopulmonary disease.   Original Report Authenticated By: Harmon Pier, M.D.    Discharge Medications     Medication List    TAKE these medications       ALPRAZolam 1 MG tablet  Commonly known as:  XANAX  Take 1 mg by mouth 3 (three) times daily as needed for anxiety.     BC HEADACHE  POWDER PO  Take 1 packet by mouth daily as needed (for headaches).     furosemide 20 MG tablet  Commonly known as:  LASIX  Take 1 tablet (20 mg total) by mouth daily as needed (swelling).     LO LOESTRIN FE 1 MG-10 MCG / 10 MCG tablet  Generic drug:  Norethindrone-Ethinyl Estradiol-Fe Biphas  Take 1 tablet by mouth daily.     verapamil 120 MG CR tablet  Commonly known as:   CALAN-SR  Take 1/2 tablet daily        Disposition   The patient will be discharged in stable condition to home.     Discharge Orders   Future Appointments Provider Department Dept Phone   06/13/2012 9:00 AM Hillis Range, MD [place] [facility] Main [facility] [place]) [phone]   Future Orders Complete By Expires     Diet - low sodium heart healthy  As directed     Increase activity slowly  As directed       Follow-up Information   Follow up with Hillis Range, MD. (Our office will call you with an appointment)    Contact information:   [address] [place] [address] [phone]         Duration of Discharge Encounter: Greater than 30 minutes including physician and PA time.  Signed, Ronie Spies PA-C 05/31/2012, 2:55 PM

## 2012-05-31 NOTE — Progress Notes (Signed)
VASCULAR LAB PRELIMINARY  PRELIMINARY  PRELIMINARY  PRELIMINARY  Left upper extremity venous duplex completed.    Preliminary report:  Left:  No evidence of DVT or superficial thrombosis.  High velocities and mosaic color in the subclavian vein as it comes from behind the clavicle.    Alexandros Ewan, RVT 05/31/2012, 3:17 PM

## 2012-05-31 NOTE — H&P (Addendum)
Patient ID: SEELEY SOUTHGATE MRN: [id number], DOB/AGE: 1975-05-23   Admit date: 05/30/2012   Primary Physician: Pcp Not In System Primary Cardiologist: Allred  Pt. Profile:  Left arm swelling  Problem List  Past Medical History  Diagnosis Date  . Abnormal Pap smear 2004    colpo  . H/O candidiasis   . H/O varicella 2005  . Pre-eclampsia     H/O  . Pelvic pain in female 2007  . Candida vaginitis 05/2006  . IBS (irritable bowel syndrome)   . 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 Tonsillectomy  1995  . Cholecystectomy    . Ep study and ablation  04/29/12    slow pathway ablation by Dr Johney Frame   . Pacemaker insertion  04/30/12    Medtronic Adapta L implanted by Dr Ladona Ridgel for complete heart block     Allergies  No Known Allergies  HPI  This is a 37 year old female who underwent SVT ablation on 04/29/2012 complicated by complete heart block requiring pacemaker implantation on 04/30/2012.  Since then she says she has had some left arm swelling that remains controlled as long as she keeps her arm elevated.  She has had some fatigue and dyspnea on exertion.  She denies chest pain.  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  She and her husband were concerned and presented to ED at [facility].  The ED physician discussed the case with vascular surgery who suggested venogram as this was likely thrombosis.  Her arm was kept elevated, iced, and she was put on heparin drip.  Since then her symptoms have improved remarkably.  She has been transferred to [facility] for further evaluation.  Home Medications  Prior to Admission medications   Medication Sig  Start Date End Date Taking? Authorizing Provider  ALPRAZolam Prudy Feeler) 1 MG tablet Take 1 mg by mouth 3 (three) times daily as needed for anxiety.   Yes Historical Provider, MD  Aspirin-Salicylamide-Caffeine (BC HEADACHE POWDER PO) Take 1 packet by mouth daily as needed (for headaches).   Yes Historical Provider, MD  Norethindrone-Ethinyl Estradiol-Fe Biphas (LO LOESTRIN FE) 1 MG-10 MCG / 10 MCG tablet Take 1 tablet by mouth daily.   Yes Historical Provider, MD  verapamil (CALAN-SR) 120 MG CR tablet Take 1/2 tablet daily 05/26/12  Yes Hillis Range, MD  furosemide (LASIX) 20 MG tablet Take 1 tablet (20 mg total) by mouth daily. 05/12/12   Hillis Range, MD  HYDROcodone-acetaminophen (NORCO/VICODIN) 5-325 MG per tablet Take 1 tablet by mouth every 6 (six) hours as needed for pain. 05/01/12   Minda Meo, PA-C    Family History  Family History  Problem Relation Age of Onset  . Diabetes Mother   . Cancer Sister     Lump removed frfom shoulder  . Heart disease Maternal Grandfather     MI  . Cancer Paternal Grandmother     breast    Social History  History   Social History  . Marital Status: Married    Spouse Name: N/A    Number of Children: N/A  . Years of Education: N/A  Occupational History  . Not on file.   Social History Main Topics  . Smoking status: Current Some Day Smoker  . Smokeless tobacco: Never Used  . Alcohol Use: Yes     Comment: 2 glasses of beer 3-4 times per week  . Drug Use: No  . Sexually Active: Yes -- Female partner(s)    Birth Control/ Protection: Pill     Comment: loloestrin fe   Other Topics Concern  . Not on file   Social History Narrative   Lives in [place] with spouse and son age 21.   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  Cardiovascular:  No chest pain, +dyspnea on exertion, +edema, no orthopnea, palpitations, paroxysmal nocturnal  dyspnea. Dermatological: No rash, lesions/masses Respiratory: No cough, dyspnea Urologic: No hematuria, dysuria Abdominal:   No nausea, vomiting, diarrhea, bright red blood per rectum, melena, or hematemesis Neurologic:  No visual changes, wkns, changes in mental status. All other systems reviewed and are otherwise negative except as noted above.  Physical Exam  Blood pressure 103/65, pulse 89, temperature 98 F (36.7 C), temperature source Oral, resp. rate 17, last menstrual period 05/27/2012, SpO2 98.00%.  General: Pleasant, NAD Psych: Normal affect. Neuro: Alert and oriented X 3. Moves all extremities spontaneously. HEENT: Normal  Neck: Supple without bruits or JVD. Lungs:  Resp regular and unlabored, CTA. Chest: pacemaker sight without swelling, induration, erythema Heart: RRR no s3, s4, or murmurs. Abdomen: Soft, non-tender, non-distended, BS + x 4.  Extremities: Left forearm with mild swelling and mild purplish discoloration to the left palm. No clubbing, cyanosis or edema. DP/PT/Radials 2+ and equal bilaterally.  Labs   Recent Labs  05/31/12 0023  TROPONINI <0.30   Lab Results  Component Value Date   WBC 9.4 05/31/2012   HGB 12.1 05/31/2012   HCT 36.5 05/31/2012   MCV 91.0 05/31/2012   PLT 274 05/31/2012    Recent Labs Lab 05/31/12 0023  NA 143  K 3.8  CL 106  CO2 23  BUN 6  CREATININE 0.60  CALCIUM 9.7  GLUCOSE 87   No results found for this basename: CHOL, HDL, LDLCALC, TRIG   No results found for this basename: DDIMER     Radiology/Studies  Dg Chest 2 View  05/31/2012  *RADIOLOGY REPORT*  Clinical Data: Shortness of breath and arm pain.  CHEST - 2 VIEW  Comparison: 05/01/2012 and 01/16/2009 chest radiographs  Findings: The cardiomediastinal silhouette is unremarkable. A left-sided pacemaker with leads overlying the right atrium and right ventricle again noted. There is no evidence of focal airspace disease, pulmonary edema, suspicious pulmonary  nodule/mass, pleural effusion, or pneumothorax. No acute bony abnormalities are identified.  IMPRESSION: No evidence of active cardiopulmonary disease.   Original Report Authenticated By: Harmon Pier, M.D.    Dg Chest 2 View  05/01/2012  *RADIOLOGY REPORT*  Clinical Data: Status post pacemaker placement  CHEST - 2 VIEW  Comparison: 04/07/2012  Findings: A dual lead pacer is in place via a left subclavian approach with lead wires intact and lead tips projecting over the region of the right atrium and right ventricle.  The cardiomediastinal silhouette is within normal limits.  The lung fields are clear with no signs of focal infiltrate or congestive failure.  No pleural fluid or significant peribronchial cuffing is seen.  Bony structures appear intact and surgical clips are noted in the right upper quadrant.  IMPRESSION: Stable appearance post pacer placement.   Original Report Authenticated By: Rhodia Albright, M.D.     ECG  A sensed V paced  ASSESSMENT  1. Left arm swelling possibly from LUE DVT related to her pacemaker  2. S/p SVT ablation and dual chamber pacemaker implantation last month  PLAN 1. Will continue heparin drip for empiric treatment of DVT. 2. Keep arm elevated. 3. Obtain non-invasive study to r/o DVT.   Signed, Kirk Ruths, MD 05/31/2012, 4:43 AM As above, await dopplers; if neg, may need venogram; LUE without swelling, discoloration; pacemaker site without hematoma or infection; no recent fevers. Olga Millers Addendum-I discussed this with Dr. Johney Frame. He feels her presentation is more likely consistent with venous stenosis following pacemaker placement. She apparently has had some of the symptoms since her pacemaker was placed and he has evaluated her for this. We therefore will plan Dopplers of her left upper extremity. If negative we will discontinue anticoagulation and she will be discharged with close followup with Dr. Johney Frame. Olga Millers

## 2012-05-31 NOTE — Progress Notes (Signed)
ANTICOAGULATION CONSULT NOTE - Follow Up Consult  Pharmacy Consult for heparin Indication: R/o upper extremity clot   No Known Allergies  Patient Measurements: Height: 5\' 4"  (162.6 cm) Weight: 150 lb (68.04 kg) IBW/kg (Calculated) : 54.7 Heparin Dosing Weight: 68 kg  Vital Signs: Temp: 97.6 F (36.4 C) (03/22 0455) Temp src: Axillary (03/22 0455) BP: 114/68 mmHg (03/22 0455) Pulse Rate: 74 (03/22 0455)  Labs:  Recent Labs  05/31/12 0023 05/31/12 0600 05/31/12 0730  HGB 12.1 11.6*  --   HCT 36.5 34.3*  --   PLT 274 254  --   LABPROT 12.3  --   --   INR 0.92  --   --   HEPARINUNFRC  --   --  0.57  CREATININE 0.60 0.60  --   TROPONINI <0.30  --   --     Estimated Creatinine Clearance: 92.1 ml/min (by C-G formula based on Cr of 0.6).   Assessment: Patient is a 37 y.o F s/p recent pacemaker placement currently on heparin for suspected clot in upper extremity d/t pacer wires with plan for venogram today.  Heparin level is at goal this morning at 0.57.  Goal of Therapy:  Heparin level 0.3-0.7 units/ml Monitor platelets by anticoagulation protocol: Yes   Plan:  1) Continue current heparin regimen  Emmory Solivan P 05/31/2012,8:01 AM

## 2012-05-31 NOTE — Progress Notes (Signed)
ANTICOAGULATION CONSULT NOTE - Initial Consult  Pharmacy Consult for heparin Indication: R/o upper extremity clot  No Known Allergies  Patient Measurements:   Previous weight / height: 168 cm, 68 kg   Vital Signs: Temp: 98 F (36.7 C) (03/21 2347) Temp src: Oral (03/21 2347) BP: 118/67 mmHg (03/21 2347) Pulse Rate: 87 (03/21 2347)  Labs:  Recent Labs  05/31/12 0023  HGB 12.1  HCT 36.5  PLT 274  LABPROT 12.3  INR 0.92    The CrCl is unknown because both a height and weight (above a minimum accepted value) are required for this calculation.   Medical History: Past Medical History  Diagnosis Date  . Abnormal Pap smear 2004    colpo  . H/O candidiasis   . H/O varicella 2005  . Pre-eclampsia     H/O  . Pelvic pain in female 2007  . Candida vaginitis 05/2006  . IBS (irritable bowel syndrome)   . 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 [COMPLETED] fentaNYL  50 mcg Intravenous Once  . heparin       Assessment: 37 yo female presented to [facility] with r/o upper extremity clot. Pharmacy consulted to manage IV heparin.   Goal of Therapy:  Heparin level 0.3-0.7 units/ml Monitor platelets by anticoagulation protocol: Yes   Plan:  1. Heparin 4000 unit IV bolus x 1, then IV infusion of 1100 units/hrs 2. Heparin level in 6 hours.  3. Daily CBC, heparin level   Emeline Gins 05/31/2012,1:00 AM

## 2012-05-31 NOTE — ED Provider Notes (Signed)
History     CSN: [id number]  Arrival date & time 05/30/12  2333   First MD Initiated Contact with Patient 05/31/12 0006      Chief Complaint  Patient presents with  . Arm Pain    (Consider location/radiation/quality/duration/timing/severity/associated sxs/prior treatment) Patient is a 37 y.o. female presenting with arm pain. The history is provided by the patient and the spouse.  Arm Pain This is a recurrent problem. The current episode started 6 to 12 hours ago. The problem occurs constantly. The problem has not changed since onset.Pertinent negatives include no chest pain, no abdominal pain, no headaches and no shortness of breath. Nothing aggravates the symptoms. The symptoms are relieved by rest and position. Treatments tried: elevation and rest. The treatment provided mild relief.  Arm is numb from elbow down and is also painful and has been swollen and discolored.  Discoloration is improving but not numbness nor swelling.  This happened once following pacemaker insertion in February and resolved spontaneously but not today.    Past Medical History  Diagnosis Date  . Abnormal Pap smear 2004    colpo  . H/O candidiasis   . H/O varicella 2005  . Pre-eclampsia     H/O  . Pelvic pain in female 2007  . Candida vaginitis 05/2006  . IBS (irritable bowel syndrome)   . 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 Tonsillectomy  1995  . Cholecystectomy    . Ep study and ablation  04/29/12    slow pathway ablation by Dr Johney Frame   . Pacemaker insertion  04/30/12    [employer]onic Adapta L implanted by Dr Ladona Ridgel for complete heart block    Family History  Problem Relation Age of Onset  . Diabetes Mother   . Cancer Sister     Lump removed frfom shoulder  . Heart disease Maternal Grandfather     MI  . Cancer Paternal Grandmother      breast    History  Substance Use Topics  . Smoking status: Current Some Day Smoker  . Smokeless tobacco: Never Used  . Alcohol Use: Yes     Comment: 2 glasses of beer 3-4 times per week    OB History   Grav Para Term Preterm Abortions TAB SAB Ect Mult Living   1 1 1       1       Review of Systems  Constitutional: Negative for fever.  Respiratory: Negative for shortness of breath.   Cardiovascular: Negative for chest pain.  Gastrointestinal: Negative for abdominal pain.  Musculoskeletal: Positive for arthralgias.  Neurological: Positive for numbness. Negative for weakness and headaches.  All other systems reviewed and are negative.    Allergies  Review of patient's allergies indicates no known allergies.  Home Medications   Current Outpatient Rx  Name  Route  Sig  Dispense  Refill  . ALPRAZolam (XANAX) 1 MG tablet   Oral   Take 1 mg by mouth 3 (three) times daily as needed for anxiety.         . Aspirin-Salicylamide-Caffeine (BC HEADACHE POWDER PO)   Oral   Take 1 packet by mouth daily as needed (for headaches).         . Norethindrone-Ethinyl Estradiol-Fe Biphas (LO LOESTRIN FE) 1 MG-10 MCG / 10 MCG tablet   Oral  Take 1 tablet by mouth daily.         . verapamil (CALAN-SR) 120 MG CR tablet      Take 1/2 tablet daily   30 tablet   11   . furosemide (LASIX) 20 MG tablet   Oral   Take 1 tablet (20 mg total) by mouth daily.   5 tablet   0   . HYDROcodone-acetaminophen (NORCO/VICODIN) 5-325 MG per tablet   Oral   Take 1 tablet by mouth every 6 (six) hours as needed for pain.   10 tablet   0     BP 103/63  Pulse 75  Temp(Src) 98 F (36.7 C) (Oral)  Resp 17  SpO2 99%  LMP 05/27/2012  Physical Exam  Constitutional: She is oriented to person, place, and time. She appears well-developed and well-nourished. No distress.  HENT:  Head: Normocephalic and atraumatic.  Mouth/Throat: Oropharynx is clear and moist.  Eyes: Conjunctivae are  normal. Pupils are equal, round, and reactive to light.  Neck: Normal range of motion. Neck supple.  Cardiovascular: Normal rate, regular rhythm and intact distal pulses.   Left radial pulse intact  Pulmonary/Chest: Effort normal and breath sounds normal. No stridor. No respiratory distress. She has no wheezes. She has no rales.  Abdominal: Soft. Bowel sounds are normal. There is no tenderness. There is no rebound and no guarding.  Musculoskeletal: Normal range of motion. She exhibits edema.  Neurological: She is alert and oriented to person, place, and time. She has normal reflexes.  Diminished sensation from elbow to fingers on left arm, cap refill < 2 sec to all fingers of the left hand radial pulse intact intact flexion extension pronation and supination is moving all finger.  Skin is pink and blanchable.  No ecchymosis, no mottling left arm swollen in comparison to the the right no erythema no warmth no flunctance.    Skin: Skin is warm and dry. No rash noted. No pallor.  Psychiatric: She has a normal mood and affect.    ED Course  Procedures (including critical care time)  Labs Reviewed  CBC WITH DIFFERENTIAL  BASIC METABOLIC PANEL  PROTIME-INR  TROPONIN I  HEPARIN LEVEL (UNFRACTIONATED)   Dg Chest 2 View  05/31/2012  *RADIOLOGY REPORT*  Clinical Data: Shortness of breath and arm pain.  CHEST - 2 VIEW  Comparison: 05/01/2012 and 01/16/2009 chest radiographs  Findings: The cardiomediastinal silhouette is unremarkable. A left-sided pacemaker with leads overlying the right atrium and right ventricle again noted. There is no evidence of focal airspace disease, pulmonary edema, suspicious pulmonary nodule/mass, pleural effusion, or pneumothorax. No acute bony abnormalities are identified.  IMPRESSION: No evidence of active cardiopulmonary disease.   Original Report Authenticated By: Harmon Pier, M.D.      1. Left arm swelling   2. Arm numbness left       MDM  Case d/w Dr.  Hart Rochester,  likely clot in subclavian secondary to pacer wires will need admission and anticoagulation and venogram in the am.  Admit to a medicine service.   Case d/w Dr. Allena Katz of cardiology who will admit for Dr. Johney Frame.   Heparin initiated    Date: 05/31/2012  Rate: 78  Rhythm: atrial sensed pacer  QRS Axis: left  Intervals: QT prolonged  ST/T Wave abnormalities: normal  Conduction Disutrbances:none  Narrative Interpretation:   Old EKG Reviewed: unchanged          Ariea Rochin K York Valliant-Rasch, MD 05/31/12 0144

## 2012-05-31 NOTE — Discharge Summary (Signed)
See progress notes Teona Vargus  

## 2012-06-01 NOTE — Discharge Summary (Signed)
See progress notes [name] [name]  

## 2012-06-02 ENCOUNTER — Telehealth: Payer: Self-pay | Admitting: *Deleted

## 2012-06-02 NOTE — Telephone Encounter (Signed)
Spoke with patient re: hospitalization this weekend for left arm swelling.  Patient states that swelling and discoloration was decreased while on Heparin in the hospital.  LUE ultrasound was obtained which demonstrated no thrombus.    Dr Johney Frame aware of above and advised patient continue to elevate arm.  Pt aware and will call back with further problems.  She has an appt with Dr Johney Frame 4-4 that she will keep.

## 2012-06-06 ENCOUNTER — Telehealth: Payer: Self-pay | Admitting: Internal Medicine

## 2012-06-06 NOTE — Telephone Encounter (Signed)
Mailed pt FMLA to her home Home Address.  06/06/12/KM

## 2012-06-08 NOTE — Progress Notes (Signed)
[name] [name] is a 37 y.o. female who presents today for electrophysiology followup.   Her fatigue has much improved.  Her weight is stable   Her exercise tolerance is improved.  She reports mild swelling in her hands.  Today, she denies symptoms of chest pain, shortness of breath, fevers, chills, , dizziness, presyncope, or syncope.  The patient is otherwise without complaint today.   Past Medical History  Diagnosis Date  . Abnormal Pap smear 2004    colpo  . H/O candidiasis   . H/O varicella 2005  . Pre-eclampsia     H/O  . Pelvic pain in female 2007  . Candida vaginitis 05/2006  . IBS (irritable bowel syndrome)   . 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 Tonsillectomy  1995  . Cholecystectomy    . Ep study and ablation  04/29/12    slow pathway ablation by Dr Johney Frame   . Pacemaker insertion  04/30/12    Medtronic Adapta L implanted by Dr Ladona Ridgel for complete heart block    Current Outpatient Prescriptions  Medication Sig Dispense Refill  . ALPRAZolam (XANAX) 1 MG tablet Take 1 mg by mouth 3 (three) times daily as needed for anxiety.      . Aspirin-Salicylamide-Caffeine (BC HEADACHE POWDER PO) Take 1 packet by mouth daily as needed (for headaches).      . Norethindrone-Ethinyl Estradiol-Fe Biphas (LO LOESTRIN FE) 1 MG-10 MCG / 10 MCG tablet Take 1 tablet by mouth daily.      . verapamil (CALAN-SR) 120 MG CR tablet Take 1/2 tablet daily  30 tablet  11  . furosemide (LASIX) 20 MG tablet Take 1 tablet (20 mg total) by mouth daily as needed (swelling).  10 tablet  0   No current facility-administered medications for this visit.    Physical Exam: Filed Vitals:   05/26/12 1056  BP: 111/83  Pulse: 92  Weight: 150 lb (68.04 kg)    GEN- The patient is anxious appearing, alert and oriented x 3 today.   Head-  normocephalic, atraumatic Eyes-  Sclera clear, conjunctiva pink Ears- hearing intact Oropharynx- clear Lungs- Clear to ausculation bilaterally, normal work of breathing Chest- pacemaker pocket is well healed Heart- Regular rate and rhythm, no murmurs, rubs or gallops, PMI not laterally displaced GI- soft, NT, ND, + BS Extremities- no clubbing, cyanosis, or edema  Pacemaker interrogation- reviewed in detail today,  See PACEART report  Assessment and Plan:  1. Complete heart block Normal pacemaker function See Arita Miss Art report Doing much better  2. Palpitations Much improved with verapamil. We will wean verapamil to off.  3. Swelling Echo reviewed No significant swelling on exam.  Hopefully this will improve off of verapamil  I have encouraged her to increase activity and become more active at this time.

## 2012-06-13 ENCOUNTER — Encounter: Payer: Self-pay | Admitting: Internal Medicine

## 2012-06-13 ENCOUNTER — Ambulatory Visit (INDEPENDENT_AMBULATORY_CARE_PROVIDER_SITE_OTHER): Payer: 59 | Admitting: Internal Medicine

## 2012-06-13 VITALS — BP 100/70 | HR 82 | Wt 151.1 lb

## 2012-06-13 DIAGNOSIS — I442 Atrioventricular block, complete: Secondary | ICD-10-CM

## 2012-06-13 NOTE — Patient Instructions (Addendum)
Your physician wants you to follow-up in: 6 weeks with Dr Court Joy will receive a reminder letter in the mail two months in advance. If you don't receive a letter, please call our office to schedule the follow-up appointment.

## 2012-06-14 NOTE — Progress Notes (Signed)
[name] [name] is a 37 y.o. female who presents today for electrophysiology followup.  She looks great.  Her energy and exercise tolerance have normalized.  She reports mild swelling in her L arm.  She recently had an ultrasound which did not reveal DVT.  Today, she denies symptoms of chest pain, shortness of breath, fevers, chills, dizziness, presyncope, or syncope.  The patient is otherwise without complaint today.   Past Medical History  Diagnosis Date  . Abnormal Pap smear 2004    colpo  . H/O candidiasis   . H/O varicella 2005  . Pre-eclampsia     H/O  . Pelvic pain in female 2007  . Candida vaginitis 05/2006  . IBS (irritable bowel syndrome)   . 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 Tonsillectomy  1995  . Cholecystectomy    . Ep study and ablation  04/29/12    slow pathway ablation by Dr Johney Frame   . Pacemaker insertion  04/30/12    Medtronic Adapta L implanted by Dr Ladona Ridgel for complete heart block    Current Outpatient Prescriptions  Medication Sig Dispense Refill  . ALPRAZolam (XANAX) 1 MG tablet Take 1 mg by mouth 3 (three) times daily as needed for anxiety.      . Aspirin-Salicylamide-Caffeine (BC HEADACHE POWDER PO) Take 1 packet by mouth daily as needed (for headaches).      . furosemide (LASIX) 20 MG tablet Take 1 tablet (20 mg total) by mouth daily as needed (swelling).  10 tablet  0  . Norethindrone-Ethinyl Estradiol-Fe Biphas (LO LOESTRIN FE) 1 MG-10 MCG / 10 MCG tablet Take 1 tablet by mouth daily.      . verapamil (CALAN-SR) 120 MG CR tablet Take 1/2 tablet daily  30 tablet  11   No current facility-administered medications for this visit.    Physical Exam: Filed Vitals:   06/13/12 0915  BP: 100/70  Pulse: 82  Weight: 151 lb 1.9 oz (68.548 kg)    GEN- The patient is anxious appearing, alert and  oriented x 3 today.   Head- normocephalic, atraumatic Eyes-  Sclera clear, conjunctiva pink Ears- hearing intact Oropharynx- clear Lungs- Clear to ausculation bilaterally, normal work of breathing Chest- pacemaker pocket is well healed Heart- Regular rate and rhythm, no murmurs, rubs or gallops, PMI not laterally displaced GI- soft, NT, ND, + BS Extremities- no clubbing, cyanosis, or edema No significant swelling in the left arm, though she does have a few prominent collateral vessels forming over the L upper arm and chest  Pacemaker interrogation- reviewed in detail today,  See PACEART report  Assessment and Plan:  1. Complete heart block Normal pacemaker function See Arita Miss Art report Doing much better  2. Palpitations Stable with 1/2 dose verapamil Stop verapamil at this time  3. Swelling Echo and [place] reviewed No significant swelling on exam Elevation and supportive measures are encouarged

## 2012-07-25 ENCOUNTER — Encounter: Payer: Self-pay | Admitting: Internal Medicine

## 2012-07-25 ENCOUNTER — Ambulatory Visit (INDEPENDENT_AMBULATORY_CARE_PROVIDER_SITE_OTHER): Payer: 59 | Admitting: Internal Medicine

## 2012-07-25 VITALS — BP 110/70 | HR 68 | Ht 64.0 in | Wt 153.8 lb

## 2012-07-25 DIAGNOSIS — I442 Atrioventricular block, complete: Secondary | ICD-10-CM

## 2012-07-25 LAB — PACEMAKER DEVICE OBSERVATION
AL AMPLITUDE: 5.6 mv
BATTERY VOLTAGE: 2.8 V
RV LEAD IMPEDENCE PM: 552 Ohm
VENTRICULAR PACING PM: 100

## 2012-07-25 NOTE — Patient Instructions (Addendum)
Your physician wants you to follow-up in: 04/2013 with Dr Johney Frame Bonita Quin will receive a reminder letter in the mail two months in advance. If you don't receive a letter, please call our office to schedule the follow-up appointment.  Your physician has requested that you have an echocardiogram. Echocardiography is a painless test that uses sound waves to create images of your heart. It provides your doctor with information about the size and shape of your heart and how well your heart's chambers and valves are working. This procedure takes approximately one hour. There are no restrictions for this procedure.----AV OPTIMIZATION ECHO TRY FOR NEXT WED

## 2012-07-26 NOTE — Progress Notes (Signed)
[name] [name] is a 37 y.o. female who presents today for electrophysiology followup.  She looks great. Her L arm swelling has resolved.  She reports occasional swelling in her legs towards the end of the day.  She just stopped taking her verapamil this past weekend.  She is accompanied by her mother today.  Her mother feels that her exercise tolerance is not completely back to normal.  She is concerned that when shopping the patient appears to tire a little early.  The patient however continues to endorse significant improvements in her exercise tolerance overall.  Today, she denies symptoms of palpitations, chest pain, shortness of breath,  dizziness, presyncope, or syncope.  The patient is otherwise without complaint today.   Past Medical History  Diagnosis Date  . Abnormal Pap smear 2004    colpo  . H/O candidiasis   . H/O varicella 2005  . Pre-eclampsia     H/O  . Pelvic pain in female 2007  . Candida vaginitis 05/2006  . IBS (irritable bowel syndrome)   . 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 Tonsillectomy  1995  . Cholecystectomy    . Ep study and ablation  04/29/12    slow pathway ablation by Dr Johney Frame   . Pacemaker insertion  04/30/12    Medtronic Adapta L implanted by Dr Ladona Ridgel for complete heart block    Current Outpatient Prescriptions  Medication Sig Dispense Refill  . ALPRAZolam (XANAX) 1 MG tablet Take 1 mg by mouth 3 (three) times daily as needed for anxiety.      . Aspirin-Salicylamide-Caffeine (BC HEADACHE POWDER PO) Take 1 packet by mouth daily as needed (for headaches).      . furosemide (LASIX) 20 MG tablet Take 1 tablet (20 mg total) by mouth daily as needed (swelling).  10 tablet  0  . Norethindrone-Ethinyl Estradiol-Fe Biphas (LO LOESTRIN FE) 1 MG-10 MCG / 10 MCG tablet Take 1 tablet by mouth daily.       . verapamil (CALAN-SR) 120 MG CR tablet Take 1/2 tablet daily  30 tablet  11   No current facility-administered medications for this visit.    Physical Exam: Filed Vitals:   07/25/12 1156  BP: 110/70  Pulse: 68  Height: 5\' 4"  (1.626 m)  Weight: 153 lb 12.8 oz (69.763 kg)    GEN- The patient is anxious appearing, alert and oriented x 3 today.   Head- normocephalic, atraumatic Eyes-  Sclera clear, conjunctiva pink Ears- hearing intact Oropharynx- clear Lungs- Clear to ausculation bilaterally, normal work of breathing Chest- pacemaker pocket is well healed Heart- Regular rate and rhythm, no murmurs, rubs or gallops, PMI not laterally displaced GI- soft, NT, ND, + BS Extremities- no clubbing, cyanosis, or edema No significant swelling in the left arm, though she does have a few prominent collateral vessels forming over the L upper arm and chest  Pacemaker interrogation- reviewed in detail today,  See PACEART report  Assessment and Plan:  1. Complete heart block Normal pacemaker function See Arita Miss Art report Doing much better Given concerns for some reduced exercise tolerance at this point, I will schedule for echo guided AV optimization.  2. Palpitations- resolved She has occasional short episodes of atrial tachycardia on device interrogation for which she is asymptomatic Stop verapamil at this time  3. Swelling Echo  and [place] reviewed No significant swelling on exam Stop verapamil which I think is contributing Elevation and supportive measures are encouarged

## 2012-07-30 ENCOUNTER — Ambulatory Visit (INDEPENDENT_AMBULATORY_CARE_PROVIDER_SITE_OTHER): Payer: 59 | Admitting: Internal Medicine

## 2012-07-30 ENCOUNTER — Ambulatory Visit (HOSPITAL_COMMUNITY): Payer: 59 | Attending: Cardiology | Admitting: Radiology

## 2012-07-30 DIAGNOSIS — I442 Atrioventricular block, complete: Secondary | ICD-10-CM

## 2012-07-30 DIAGNOSIS — R5381 Other malaise: Secondary | ICD-10-CM

## 2012-07-30 DIAGNOSIS — I455 Other specified heart block: Secondary | ICD-10-CM | POA: Insufficient documentation

## 2012-07-30 DIAGNOSIS — R5383 Other fatigue: Secondary | ICD-10-CM

## 2012-07-30 NOTE — Progress Notes (Addendum)
Limited Echocardiogram performed for AV Optimization.  

## 2012-07-31 NOTE — Progress Notes (Signed)
Patient seen today for AV optimization echo (see Dr Jenel Lucks previous note).  AV opt performed, device reprogrammed from PAV/SAV of 150/120 to 100/80 after images reviewed by Dr Johney Frame.  Pt to follow up in 4 weeks with Dr Johney Frame.  She will call back with problems before then.   Pt aware and agrees with plan.

## 2012-08-01 ENCOUNTER — Emergency Department (HOSPITAL_BASED_OUTPATIENT_CLINIC_OR_DEPARTMENT_OTHER): Payer: 59

## 2012-08-01 ENCOUNTER — Encounter (HOSPITAL_BASED_OUTPATIENT_CLINIC_OR_DEPARTMENT_OTHER): Payer: Self-pay | Admitting: *Deleted

## 2012-08-01 ENCOUNTER — Emergency Department (HOSPITAL_BASED_OUTPATIENT_CLINIC_OR_DEPARTMENT_OTHER)
Admission: EM | Admit: 2012-08-01 | Discharge: 2012-08-01 | Disposition: A | Discharge status: Home / Self Care | Payer: 59 | Attending: Emergency Medicine | Admitting: Emergency Medicine

## 2012-08-01 ENCOUNTER — Other Ambulatory Visit: Payer: Self-pay

## 2012-08-01 DIAGNOSIS — Z79899 Other long term (current) drug therapy: Secondary | ICD-10-CM | POA: Insufficient documentation

## 2012-08-01 DIAGNOSIS — Z950 Presence of cardiac pacemaker: Secondary | ICD-10-CM | POA: Insufficient documentation

## 2012-08-01 DIAGNOSIS — F411 Generalized anxiety disorder: Secondary | ICD-10-CM | POA: Insufficient documentation

## 2012-08-01 DIAGNOSIS — R110 Nausea: Secondary | ICD-10-CM | POA: Insufficient documentation

## 2012-08-01 DIAGNOSIS — R0989 Other specified symptoms and signs involving the circulatory and respiratory systems: Secondary | ICD-10-CM | POA: Insufficient documentation

## 2012-08-01 DIAGNOSIS — R42 Dizziness and giddiness: Secondary | ICD-10-CM | POA: Insufficient documentation

## 2012-08-01 DIAGNOSIS — R0609 Other forms of dyspnea: Secondary | ICD-10-CM | POA: Insufficient documentation

## 2012-08-01 DIAGNOSIS — R450 Nervousness: Secondary | ICD-10-CM | POA: Insufficient documentation

## 2012-08-01 DIAGNOSIS — Z8739 Personal history of other diseases of the musculoskeletal system and connective tissue: Secondary | ICD-10-CM | POA: Insufficient documentation

## 2012-08-01 DIAGNOSIS — F17200 Nicotine dependence, unspecified, uncomplicated: Secondary | ICD-10-CM | POA: Insufficient documentation

## 2012-08-01 DIAGNOSIS — Z8719 Personal history of other diseases of the digestive system: Secondary | ICD-10-CM | POA: Insufficient documentation

## 2012-08-01 DIAGNOSIS — Z8619 Personal history of other infectious and parasitic diseases: Secondary | ICD-10-CM | POA: Insufficient documentation

## 2012-08-01 DIAGNOSIS — F419 Anxiety disorder, unspecified: Secondary | ICD-10-CM

## 2012-08-01 DIAGNOSIS — R0600 Dyspnea, unspecified: Secondary | ICD-10-CM

## 2012-08-01 DIAGNOSIS — Z8679 Personal history of other diseases of the circulatory system: Secondary | ICD-10-CM | POA: Insufficient documentation

## 2012-08-01 LAB — BASIC METABOLIC PANEL WITH GFR
CO2: 24 meq/L (ref 19–32)
Calcium: 9.6 mg/dL (ref 8.4–10.5)
Creatinine, Ser: 0.9 mg/dL (ref 0.50–1.10)
GFR calc Af Amer: >90 mL/min (ref >90)
GFR calc non Af Amer: 81 mL/min — ABNORMAL LOW (ref >90)
Sodium: 142 meq/L (ref 135–145)

## 2012-08-01 LAB — PRO B NATRIURETIC PEPTIDE: Pro B Natriuretic peptide (BNP): 60.4 pg/mL (ref 0–125)

## 2012-08-01 LAB — BASIC METABOLIC PANEL
BUN: 9 mg/dL (ref 6–23)
Chloride: 103 mEq/L (ref 96–112)
Glucose, Bld: 87 mg/dL (ref 70–99)
Potassium: 3.7 mEq/L (ref 3.5–5.1)

## 2012-08-01 LAB — TROPONIN I: Troponin I: <0.3 ng/mL (ref <0.30)

## 2012-08-01 MED ORDER — LORAZEPAM 1 MG PO TABS
1.0000 mg | ORAL_TABLET | Freq: Once | ORAL | Status: AC
  Administered 2012-08-01: 1 mg via ORAL
  Filled 2012-08-01: qty 1

## 2012-08-01 NOTE — ED Provider Notes (Signed)
History     CSN: [id number]  Arrival date & time 08/01/12  4098   First MD Initiated Contact with Patient 08/01/12 [phone]      Chief Complaint  Patient presents with  . Shortness of Breath    (Consider location/radiation/quality/duration/timing/severity/associated sxs/prior treatment) HPI Comments: Pt with h/o SVT, had ablation, unfortunately developed complete heart block, had 2 lead pacer placed in February, [employer].  Pt had an adjustment yesterday or 2 days ago in office.  Pt reports decrease in exercise tolerance.  She was walking to work and got light headed, SOB, nauseated.  EMS gave a neb with some improvement in SOB.  Pt doesn't smoke, has no h./o asthma.  No CP, pleurisy.    Patient is a 37 y.o. female presenting with shortness of breath. The history is provided by the patient.  Shortness of Breath Severity:  Severe Onset quality:  Sudden Timing:  Constant Progression:  Improving Chronicity:  New Relieved by:  Nothing Worsened by:  Nothing tried Associated symptoms: no chest pain, no cough, no fever, no vomiting and no wheezing     Past Medical History  Diagnosis Date  . Abnormal Pap smear 2004    colpo  . H/O candidiasis   . H/O varicella 2005  . Pre-eclampsia     H/O  . Pelvic pain in female 2007  . Candida vaginitis 05/2006  . IBS (irritable bowel syndrome)   . 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 Tonsillectomy  1995  . Cholecystectomy    . Ep study and ablation  04/29/12    slow pathway ablation by Dr Johney Frame   . Pacemaker insertion  04/30/12    Medtronic Adapta L implanted by Dr Ladona Ridgel for complete heart block    Family History  Problem Relation Age of Onset  . Diabetes Mother   . Cancer Sister     Lump removed frfom shoulder  . Heart disease Maternal Grandfather     MI  . Cancer  Paternal Grandmother     breast    History  Substance Use Topics  . Smoking status: Current Some Day Smoker -- 0.50 packs/day    Types: Cigarettes  . Smokeless tobacco: Never Used  . Alcohol Use: Yes     Comment: 2 glasses of beer 3-4 times per week    OB History   Grav Para Term Preterm Abortions TAB SAB Ect Mult Living   1 1 1       1       Review of Systems  Constitutional: Negative for fever and chills.  HENT: Negative for congestion and rhinorrhea.   Respiratory: Positive for shortness of breath. Negative for cough and wheezing.   Cardiovascular: Negative for chest pain.  Gastrointestinal: Negative for nausea, vomiting and diarrhea.  Neurological: Positive for light-headedness. Negative for syncope.  Psychiatric/Behavioral: The patient is nervous/anxious.   All other systems reviewed and are negative.    Allergies  Review of patient's allergies indicates no known allergies.  Home Medications   Current Outpatient Rx  Name  Route  Sig  Dispense  Refill  . ALPRAZolam (XANAX) 1 MG tablet   Oral   Take 1 mg by mouth 3 (three) times daily as needed for anxiety.         . Aspirin-Salicylamide-Caffeine (BC HEADACHE POWDER PO)  Oral   Take 1 packet by mouth daily as needed (for headaches).         . furosemide (LASIX) 20 MG tablet   Oral   Take 1 tablet (20 mg total) by mouth daily as needed (swelling).   10 tablet   0   . Norethindrone-Ethinyl Estradiol-Fe Biphas (LO LOESTRIN FE) 1 MG-10 MCG / 10 MCG tablet   Oral   Take 1 tablet by mouth daily.         . verapamil (CALAN-SR) 120 MG CR tablet      Take 1/2 tablet daily   30 tablet   11     BP 115/77  Pulse 79  Temp(Src) 97.9 F (36.6 C) (Oral)  Resp 17  Ht 5\' 4"  (1.626 m)  Wt 150 lb (68.04 kg)  BMI 25.73 kg/m2  SpO2 100%  Physical Exam  Nursing note and vitals reviewed. Constitutional: She is oriented to person, place, and time. She appears well-developed and well-nourished. No distress.   HENT:  Head: Normocephalic and atraumatic.  Eyes: Conjunctivae and EOM are normal. No scleral icterus.  Neck: Neck supple. No JVD present.  Cardiovascular: Normal rate and intact distal pulses.   No murmur heard. Pulmonary/Chest: Effort normal. No stridor. No respiratory distress. She has no wheezes. She has no rales.  Abdominal: Soft. There is no tenderness.  Neurological: She is alert and oriented to person, place, and time.  Skin: Skin is warm and dry. No rash noted. No pallor.  Psychiatric: Her mood appears anxious. Her speech is delayed.    ED Course  Procedures (including critical care time)  Labs Reviewed  BASIC METABOLIC PANEL - Abnormal; Notable for the following:    GFR calc non Af Amer 81 (*)    All other components within normal limits  PRO B NATRIURETIC PEPTIDE  TROPONIN I  PREGNANCY, URINE   Dg Chest Port 1 View  08/01/2012   *RADIOLOGY REPORT*  Clinical Data: Shortness of breath  PORTABLE CHEST - 1 VIEW  Comparison: 05/31/2012  Findings: A pacing device is again seen.  Cardiac shadow is stable. The lungs are clear bilaterally.  No acute bony abnormality is seen.  IMPRESSION: No acute abnormality noted.   Original Report Authenticated By: Alcide Clever, M.D.     1. Dyspnea   2. Anxiety     ra sat is 98% and I interpret to be normal   ECG at time 0901 shows V paced rhythm at rate 81. Abn ECG.  No change from ECG on 05/31/12.    10:09 AM [employer] rep has interrogated, some sinus tachycardia earlier today, lieky res[ponse to ehr symptoms this AM.  No arrythmia seen.  Rep spoke to AMber, RN for Dr. Johney Frame who discussed with him.  No adjustments needed today.  Pt reassured, will d/c home and she can follow up with Dr. Johney Frame next week.    MDM  Pt seems somewhat anxious here, pacer will need interrogation, will get basic labs, CXR and continue to monitor for improvement.  Will give ativan for her anxiety.  Don't suspect infection, CAD, PE.          Gavin Pound.  Lillyen Schow, MD 08/01/12 1011

## 2012-08-01 NOTE — ED Notes (Signed)
D/c home with ride- no new rx prescribed

## 2012-08-01 NOTE — ED Notes (Signed)
MD at bedside. 

## 2012-08-01 NOTE — ED Notes (Signed)
EMS states patient developed sob yesterday which became worse while walking into work today.  States she had adjustment on her pacemaker 3 days ago and states she usually takes a few days for her heart to adjust to the new changes.  Albuterol HHN given for decreased breath sounds on the right and sob with relief.

## 2012-08-01 NOTE — ED Notes (Signed)
Spoke with [employer]- they will send rep out to check pacer

## 2012-08-01 NOTE — ED Notes (Signed)
Portable EKG done.

## 2012-08-01 NOTE — Discharge Instructions (Signed)
   Dyspnea Shortness of breath (dyspnea) is the feeling of uneasy breathing. Dyspnea should be evaluated promptly. DIAGNOSIS  Many tests may be done to find why you are having shortness of breath. Tests may include:  A chest X-ray.   A lung function test.   Blood tests.   Recordings of the electrical activity of the heart (electrocardiogram).   Exercise testing.   Sound wave images of the heart (a cardiac echocardiogram).   A scan.  A cause for your shortness of breath may not be identified initially. In this case, it is important to have a follow-up exam with your caregiver. HOME CARE INSTRUCTIONS   Do not smoke. Smoking is a common cause of shortness of breath. Ask for help to stop smoking.   Avoid being around chemicals that may bother your breathing, such as paint fumes or dust.   Rest as needed. Slowly begin your usual activities.   If medications were prescribed, take them as directed for the full length of time directed. This includes oxygen and any inhaled medications, if prescribed.   It is very important that you follow up with your caregiver or other physician as directed. Waiting to do so or failure to follow up could result in worsening of your condition, possible disability, or death.   Be sure you understand what to do or who to call if your shortness of breath worsens.  SEEK MEDICAL CARE IF:   Your condition does not improve in the time expected.   You have a hard time doing your normal activities even with rest.   You have any side effects from or problems with medications prescribed.  SEEK IMMEDIATE MEDICAL CARE IF:   You feel your shortness of breath is getting worse.   You feel lightheaded, faint or develop a cough not controlled with medications.   You start coughing up blood.   You get pain with breathing.   You get chest pain or pain in your arms, shoulders or belly (abdomen).   You have a fever.   You are unable to walk up stairs or  exercise the way you normally can.  MAKE SURE YOU:   Understand these instructions.   Will watch your condition.   Will get help right away if you are not doing well or get worse.  Document Released: 04/05/2004 Document Revised: 11/08/2010 Document Reviewed: 07/14/2009 [facility] Patient Information 2012 [place], [place].

## 2012-08-01 NOTE — ED Notes (Signed)
medtronic rep at bedside for pacer interrogation.

## 2012-08-04 ENCOUNTER — Encounter: Payer: Self-pay | Admitting: Internal Medicine

## 2012-08-04 NOTE — Progress Notes (Signed)
Patient called and stated that since AV delays were changed from 150/120 to 100/80 she had been feeling more short of breath with exertion particularly when outside.  She was seen in the ER at [facility] on Friday and was given a nebulizer that seemed to provide relief.  She is concerned that the change in her AV delays are causative of her symptoms.  Discussed with Dr Johney Frame, pt brought to the office today to change AV delays back to 150/120.  Follow up appt made for 2 weeks with Dr Johney Frame.  Pt aware to call back with any increase or change in symptoms.  Pt aware and agrees with plan.

## 2012-08-18 ENCOUNTER — Encounter: Payer: 59 | Admitting: Internal Medicine

## 2012-09-01 ENCOUNTER — Encounter: Payer: 59 | Admitting: Internal Medicine

## 2012-09-11 ENCOUNTER — Encounter: Payer: Self-pay | Admitting: Internal Medicine

## 2012-09-11 ENCOUNTER — Ambulatory Visit (INDEPENDENT_AMBULATORY_CARE_PROVIDER_SITE_OTHER): Payer: 59 | Admitting: Internal Medicine

## 2012-09-11 ENCOUNTER — Other Ambulatory Visit: Payer: Self-pay | Admitting: Internal Medicine

## 2012-09-11 VITALS — BP 122/78 | HR 71 | Ht 64.0 in | Wt 156.4 lb

## 2012-09-11 DIAGNOSIS — I4891 Unspecified atrial fibrillation: Secondary | ICD-10-CM

## 2012-09-11 DIAGNOSIS — I471 Supraventricular tachycardia: Secondary | ICD-10-CM

## 2012-09-11 DIAGNOSIS — I442 Atrioventricular block, complete: Secondary | ICD-10-CM

## 2012-09-11 DIAGNOSIS — I498 Other specified cardiac arrhythmias: Secondary | ICD-10-CM

## 2012-09-11 NOTE — Patient Instructions (Signed)
Your physician has requested that you have an exercise tolerance test. For further information please visit [email or web address] Please also follow instruction sheet, as given.- Double book on Dr Jenel Lucks schedule on 09/22/12 or 09/24/12  Amber going to help with device portion

## 2012-09-11 NOTE — Progress Notes (Signed)
[name] [name] is a 37 y.o. female who presents today for electrophysiology followup.  She continues to do very well.  Her exercise tolerance is much improved.  She does find that when the temperature is very hot that she has more fatigue.  Her arm and leg swelling is resolved.  Today, she denies symptoms of palpitations, chest pain, shortness of breath,  dizziness, presyncope, or syncope.  The patient is otherwise without complaint today.   Past Medical History  Diagnosis Date  . Abnormal Pap smear 2004    colpo  . H/O candidiasis   . H/O varicella 2005  . Pre-eclampsia     H/O  . Pelvic pain in female 2007  . Candida vaginitis 05/2006  . IBS (irritable bowel syndrome)   . 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 Tonsillectomy  1995  . Cholecystectomy    . Ep study and ablation  04/29/12    slow pathway ablation by Dr Johney Frame   . Pacemaker insertion  04/30/12    Medtronic Adapta L implanted by Dr Ladona Ridgel for complete heart block    Current Outpatient Prescriptions  Medication Sig Dispense Refill  . ALPRAZolam (XANAX) 1 MG tablet Take 1 mg by mouth 3 (three) times daily as needed for anxiety.      . Aspirin-Salicylamide-Caffeine (BC HEADACHE POWDER PO) Take 1 packet by mouth daily as needed (for headaches).      . Norethindrone-Ethinyl Estradiol-Fe Biphas (LO LOESTRIN FE) 1 MG-10 MCG / 10 MCG tablet Take 1 tablet by mouth daily.       No current facility-administered medications for this visit.    Physical Exam: Filed Vitals:   09/11/12 1520  BP: 122/78  Pulse: 71  Height: 5\' 4"  (1.626 m)  Weight: 156 lb 6.4 oz (70.943 kg)    GEN- The patient is anxious appearing, alert and oriented x 3 today.   Head- normocephalic, atraumatic Eyes-  Sclera clear, conjunctiva pink Ears- hearing intact Oropharynx- clear Lungs-  Clear to ausculation bilaterally, normal work of breathing Chest- pacemaker pocket is well healed Heart- Regular rate and rhythm, no murmurs, rubs or gallops, PMI not laterally displaced GI- soft, NT, ND, + BS Extremities- no clubbing, cyanosis, or edema No significant swelling in the left arm, though she does have a few prominent collateral vessels forming over the L upper arm and chest  Pacemaker interrogation- reviewed in detail today,  See PACEART report  Assessment and Plan:  1. Complete heart block Normal pacemaker function See Arita Miss Art report Doing much better gxt to evaluate exercise tolerance formally Consider increasing upper tracking rate depending on the results  2. Palpitations- resolved  3. Swelling- resolved  Return for gxt in 1-2 weeks

## 2012-09-20 LAB — PACEMAKER DEVICE OBSERVATION
AL THRESHOLD: 0.75 V
BATTERY VOLTAGE: 2.8 V
RV LEAD THRESHOLD: 0.75 V

## 2012-09-22 ENCOUNTER — Ambulatory Visit (INDEPENDENT_AMBULATORY_CARE_PROVIDER_SITE_OTHER): Payer: 59 | Admitting: Internal Medicine

## 2012-09-22 ENCOUNTER — Encounter: Payer: Self-pay | Admitting: Internal Medicine

## 2012-09-22 DIAGNOSIS — I498 Other specified cardiac arrhythmias: Secondary | ICD-10-CM

## 2012-09-22 DIAGNOSIS — I442 Atrioventricular block, complete: Secondary | ICD-10-CM

## 2012-09-22 DIAGNOSIS — I471 Supraventricular tachycardia: Secondary | ICD-10-CM

## 2012-09-22 LAB — PACEMAKER DEVICE OBSERVATION
AL IMPEDENCE PM: 580 Ohm
AL THRESHOLD: 0.5 V
RV LEAD IMPEDENCE PM: 539 Ohm
RV LEAD THRESHOLD: 0.625 V

## 2012-09-22 NOTE — Progress Notes (Signed)
Exercise Treadmill Test  Pre-Exercise Testing Evaluation Rhythm: normal sinus with V pacing Rate: 70                 Test  Exercise Tolerance Test Ordering MD: Hillis Range, MD  Interpreting MD: Hillis Range, MD  Unique Test No: 1  Treadmill:  1  Indication for ETT: FATIGUE  Contraindication to ETT: No   Stress Modality: exercise - treadmill  Cardiac Imaging Performed: non   Protocol: standard Bruce - maximal  Max BP:  164/69  Max MPHR (bpm):  184 85% MPR (bpm):  156  MPHR obtained (bpm):  179 % MPHR obtained:  97  Reached 85% MPHR (min:sec):  3:50 Total Exercise Time (min-sec):  9:16  Workload in METS:  10.5 Borg Scale: 17  Reason ETT Terminated:  dizziness    ST Segment Analysis At Rest: V paced With Exercise: V paced  Other Information Arrhythmia:  No arrhythmias with exercise, V pacing with wenckebach at 170 bpm Angina during ETT:  none Quality of ETT:   non diagnostic for ischemia due to V pacing  ETT Interpretation:  Good exercise effort, preserved exercise tolerance with V pacing, though she is deconditioned.   Comments: The patient had adjustment of her pacemaker today with increase of max tracking rate to 170 bpm (from 120 bpm). Dynamic AV delay also turned on.  Recommendations: Follow-up in 2 months, resume normal activity Regular exercise is encouraged.

## 2012-09-26 ENCOUNTER — Encounter: Payer: Self-pay | Admitting: Internal Medicine

## 2012-11-05 ENCOUNTER — Emergency Department (HOSPITAL_BASED_OUTPATIENT_CLINIC_OR_DEPARTMENT_OTHER): Payer: 59

## 2012-11-05 ENCOUNTER — Emergency Department (HOSPITAL_BASED_OUTPATIENT_CLINIC_OR_DEPARTMENT_OTHER)
Admission: EM | Admit: 2012-11-05 | Discharge: 2012-11-05 | Disposition: A | Discharge status: Home / Self Care | Payer: 59 | Attending: Emergency Medicine | Admitting: Emergency Medicine

## 2012-11-05 ENCOUNTER — Other Ambulatory Visit: Payer: Self-pay

## 2012-11-05 ENCOUNTER — Encounter (HOSPITAL_BASED_OUTPATIENT_CLINIC_OR_DEPARTMENT_OTHER): Payer: Self-pay

## 2012-11-05 DIAGNOSIS — F17200 Nicotine dependence, unspecified, uncomplicated: Secondary | ICD-10-CM | POA: Insufficient documentation

## 2012-11-05 DIAGNOSIS — Z8619 Personal history of other infectious and parasitic diseases: Secondary | ICD-10-CM | POA: Insufficient documentation

## 2012-11-05 DIAGNOSIS — R42 Dizziness and giddiness: Secondary | ICD-10-CM | POA: Insufficient documentation

## 2012-11-05 DIAGNOSIS — Z8679 Personal history of other diseases of the circulatory system: Secondary | ICD-10-CM | POA: Insufficient documentation

## 2012-11-05 DIAGNOSIS — Z8719 Personal history of other diseases of the digestive system: Secondary | ICD-10-CM | POA: Insufficient documentation

## 2012-11-05 DIAGNOSIS — F430 Acute stress reaction: Secondary | ICD-10-CM | POA: Insufficient documentation

## 2012-11-05 DIAGNOSIS — F410 Panic disorder [episodic paroxysmal anxiety] without agoraphobia: Secondary | ICD-10-CM

## 2012-11-05 DIAGNOSIS — Z79899 Other long term (current) drug therapy: Secondary | ICD-10-CM | POA: Insufficient documentation

## 2012-11-05 DIAGNOSIS — Z3202 Encounter for pregnancy test, result negative: Secondary | ICD-10-CM | POA: Insufficient documentation

## 2012-11-05 DIAGNOSIS — Z950 Presence of cardiac pacemaker: Secondary | ICD-10-CM | POA: Insufficient documentation

## 2012-11-05 DIAGNOSIS — Z8742 Personal history of other diseases of the female genital tract: Secondary | ICD-10-CM | POA: Insufficient documentation

## 2012-11-05 DIAGNOSIS — R0602 Shortness of breath: Secondary | ICD-10-CM | POA: Insufficient documentation

## 2012-11-05 LAB — URINALYSIS, ROUTINE W REFLEX MICROSCOPIC
Bilirubin Urine: NEGATIVE
Glucose, UA: NEGATIVE mg/dL
Ketones, ur: NEGATIVE mg/dL
pH: 5.5 (ref 5.0–8.0)

## 2012-11-05 LAB — URINE MICROSCOPIC-ADD ON

## 2012-11-05 MED ORDER — LORAZEPAM 1 MG PO TABS
2.0000 mg | ORAL_TABLET | Freq: Once | ORAL | Status: AC
  Administered 2012-11-05: 2 mg via ORAL
  Filled 2012-11-05: qty 2

## 2012-11-05 MED ORDER — LORAZEPAM 2 MG PO TABS
2.0000 mg | ORAL_TABLET | Freq: Three times a day (TID) | ORAL | Status: DC | PRN
Start: 2012-11-05 — End: 2013-01-14

## 2012-11-05 NOTE — ED Provider Notes (Addendum)
CSN: [id number]     Arrival date & time 11/05/12  1034 History   First MD Initiated Contact with Patient 11/05/12 1056     Chief Complaint  Patient presents with  . Shortness of Breath  . Anxiety   (Consider location/radiation/quality/duration/timing/severity/associated sxs/prior Treatment) Patient is a 37 y.o. female presenting with shortness of breath and anxiety. The history is provided by the patient.  Shortness of Breath Severity:  Severe Onset quality:  Sudden Duration:  12 hours Timing:  Constant Progression:  Unchanged Chronicity:  Recurrent Context comment:  Pt has been very stressed at work and last night just stated to have the sensation that she could not get a deep breath Relieved by:  Nothing Worsened by:  Stress (every time she gets an email or call from work her sx gets worse) Ineffective treatments: tried to use her xanax 1mg  with only minimal benefit. Associated symptoms: no abdominal pain, no chest pain, no cough, no diaphoresis, no fever, no sputum production, no vomiting and no wheezing   Associated symptoms comment:  Feels very anxious.  Also feels a little lightheaded. Risk factors: no hx of cancer, no hx of PE/DVT, no prolonged immobilization and no recent surgery   Risk factors comment:  Only drinks 2 glasses of beer several times a week Anxiety This is a recurrent problem. The problem occurs constantly. The problem has been gradually worsening. Associated symptoms include shortness of breath. Pertinent negatives include no chest pain and no abdominal pain. The symptoms are aggravated by stress.    Past Medical History  Diagnosis Date  . Abnormal Pap smear 2004    colpo  . H/O candidiasis   . H/O varicella 2005  . Pre-eclampsia     H/O  . Pelvic pain in female 2007  . Candida vaginitis 05/2006  . IBS (irritable bowel syndrome)   . 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 Complete heart block, post-surgical  04/30/12    s/p PPM implant   Past Surgical History  Procedure Laterality Date  . Tonsillectomy  1995  . Cholecystectomy    . Ep study and ablation  04/29/12    slow pathway ablation by Dr Johney Frame   . Pacemaker insertion  04/30/12    Medtronic Adapta L implanted by Dr Ladona Ridgel for complete heart block   Family History  Problem Relation Age of Onset  . Diabetes Mother   . Cancer Sister     Lump removed frfom shoulder  . Heart disease Maternal Grandfather     MI  . Cancer Paternal Grandmother     breast   History  Substance Use Topics  . Smoking status: Current Some Day Smoker -- 0.50 packs/day    Types: Cigarettes  . Smokeless tobacco: Never Used  . Alcohol Use: Yes     Comment: 2 glasses of beer 3-4 times per week   OB History   Grav Para Term Preterm Abortions TAB SAB Ect Mult Living   1 1 1       1      Review of Systems  Constitutional: Negative for fever and diaphoresis.  Respiratory: Positive for shortness of breath. Negative for cough, sputum production and wheezing.   Cardiovascular: Negative for chest pain.  Gastrointestinal: Negative for vomiting and abdominal pain.  Neurological:       States she has felt a little light headed since last night  All other systems reviewed and are negative.    Allergies  Review of patient's allergies indicates no known allergies.  Home Medications   Current Outpatient Rx  Name  Route  Sig  Dispense  Refill  . ALPRAZolam (XANAX) 1 MG tablet   Oral   Take 1 mg by mouth 3 (three) times daily as needed for anxiety.         . Aspirin-Salicylamide-Caffeine (BC HEADACHE POWDER PO)   Oral   Take 1 packet by mouth daily as needed (for headaches).         . Norethindrone-Ethinyl Estradiol-Fe Biphas (LO LOESTRIN FE) 1 MG-10 MCG / 10 MCG tablet   Oral   Take 1 tablet by mouth daily.          BP 109/73  Pulse 99  Temp(Src) 97.9 F (36.6 C) (Oral)  Resp 20  Ht 5\' 4"  (1.626 m)  Wt 153 lb (69.4 kg)  BMI 26.25 kg/m2   SpO2 100% Physical Exam  Nursing note and vitals reviewed. Constitutional: She is oriented to person, place, and time. She appears well-developed and well-nourished. She appears distressed.  HENT:  Head: Normocephalic and atraumatic.  Mouth/Throat: Oropharynx is clear and moist.  Eyes: Conjunctivae and EOM are normal. Pupils are equal, round, and reactive to light.  Neck: Normal range of motion. Neck supple.  Cardiovascular: Normal rate, regular rhythm and intact distal pulses.   No murmur heard. Pulmonary/Chest: Effort normal and breath sounds normal. Not tachypneic. No respiratory distress. She has no wheezes. She has no rales. She exhibits no tenderness.  Abdominal: Soft. She exhibits no distension. There is no tenderness. There is no rebound and no guarding.  Musculoskeletal: Normal range of motion. She exhibits no edema and no tenderness.  Neurological: She is alert and oriented to person, place, and time.  Skin: Skin is warm and dry. No rash noted. No erythema.  Psychiatric: Her behavior is normal. Her mood appears anxious. Her speech is not rapid and/or pressured, not delayed and not tangential. She is not agitated. She expresses no homicidal and no suicidal ideation.  tearful    ED Course  Procedures (including critical care time) Labs Review Labs Reviewed  URINALYSIS, ROUTINE W REFLEX MICROSCOPIC - Abnormal; Notable for the following:    Hgb urine dipstick MODERATE (*)    All other components within normal limits  PREGNANCY, URINE  URINE MICROSCOPIC-ADD ON   Imaging Review Dg Chest 2 View  11/05/2012   *RADIOLOGY REPORT*  Clinical Data: Shortness of breath.  CHEST - 2 VIEW  Comparison: 08/01/2012.  Findings: Trachea is midline.  Heart size normal.  Left subclavian pacemaker lead tips project over the right atrium and right ventricle.  Lungs are clear.  No pleural fluid.  IMPRESSION: No acute findings.   Original Report Authenticated By: Leanna Battles, M.D.     Date:  11/05/2012  Rate: 84  Rhythm: atrial-sensed ventricular paced rhythm  QRS Axis: indeterminate  Intervals: paced rhythm  ST/T Wave abnormalities: indeterminate  Conduction Disutrbances:none  Narrative Interpretation:   Old EKG Reviewed: unchanged   MDM   1. Panic attack     Patient presents with worsening shortness of breath and anxiety since last night. She states she has been extremely stressed at work lately and things are not getting better. She has had panic attacks prior pelvis similar to this. She tried to take her Xanax 1 mg without improvement. She denies any chest pain, fever, cough. She states she feels slightly lightheaded but denies any syncope. Last menstrual period was in July. She does take  birth control pills however vital signs are within normal limits given patient's history and exam low suspicion for PE. Patient does have a history of AV nodal reentrant tachycardia and complete heart block post surgical with a pacemaker placed however he EKG shows normal functioning of the pacemaker and she denies any palpitations or skipped beats. No recent pacemaker adjustments in the last month.  Also denies any infectious symptoms and has clear bilateral breath sounds with low suspicion for pneumothorax.  We'll give Ativan and recheck. He may UPT pending. Patient denies any SI or HI.  12:44 PM All labs wnl.  CXR wnl.  UPT neg.  Pt feeling much better after ativan.  Gwyneth Sprout, MD 11/05/12 1245  Gwyneth Sprout, MD 11/05/12 1248

## 2012-11-05 NOTE — ED Notes (Signed)
Pt reports she developed anxiety last night leading to [facility].  States she feels like she can't get a deep breath.

## 2012-11-05 NOTE — ED Notes (Signed)
Previous documentation at 1236 was charted on incorrect pt.

## 2012-11-18 ENCOUNTER — Other Ambulatory Visit: Payer: Self-pay

## 2012-11-26 ENCOUNTER — Encounter: Payer: Self-pay | Admitting: Internal Medicine

## 2012-11-26 ENCOUNTER — Ambulatory Visit (INDEPENDENT_AMBULATORY_CARE_PROVIDER_SITE_OTHER): Payer: 59 | Admitting: Internal Medicine

## 2012-11-26 VITALS — BP 146/90 | HR 72 | Ht 64.0 in | Wt 164.2 lb

## 2012-11-26 DIAGNOSIS — I442 Atrioventricular block, complete: Secondary | ICD-10-CM

## 2012-11-26 LAB — PACEMAKER DEVICE OBSERVATION
AL AMPLITUDE: 5.6 mv
AL THRESHOLD: 0.5 V
RV LEAD IMPEDENCE PM: 588 Ohm
RV LEAD THRESHOLD: 0.75 V
VENTRICULAR PACING PM: 100

## 2012-11-26 NOTE — Patient Instructions (Signed)
Your physician recommends that you schedule a follow-up appointment in: 3 months with Dr Johney Frame   You have been referred to Dr Laury Axon

## 2012-11-26 NOTE — Progress Notes (Signed)
[name] [name] is a 37 y.o. female who presents today for electrophysiology followup.  She continues to do very well.  Her exercise tolerance is much improved.   Her arm and leg swelling is resolved.  She says that she "feels fine" from a cardiac standpoint.  Today, she denies symptoms of palpitations, chest pain, shortness of breath,  dizziness, presyncope, or syncope.   She has purchased a treadmill and hopes to begin working out.  She has gained weight recently and is dissatisfied with this  She had quit smoking and drinking ETOH (previously reports drinking 10 beers per day) but has recently returned to them because of work related stress. Her primary concern today is with job stress.  She attributes this to a coworker with whom she has trouble working.  She reports frequent panic attacks related to job stress.  She is tearful as she talks about the stresses of her job today.  The patient is otherwise without complaint today.   Past Medical History  Diagnosis Date  . Abnormal Pap smear 2004    colpo  . H/O candidiasis   . H/O varicella 2005  . Pre-eclampsia     H/O  . Pelvic pain in female 2007  . Candida vaginitis 05/2006  . IBS (irritable bowel syndrome)   . 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 Tonsillectomy  1995  . Cholecystectomy    . Ep study and ablation  04/29/12    slow pathway ablation by Dr Johney Frame   . Pacemaker insertion  04/30/12    Medtronic Adapta L implanted by Dr Ladona Ridgel for complete heart block    Current Outpatient Prescriptions  Medication Sig Dispense Refill  . ALPRAZolam (XANAX) 1 MG tablet Take 1 mg by mouth 3 (three) times daily as needed for anxiety.      . Aspirin-Salicylamide-Caffeine (BC HEADACHE POWDER PO) Take 1 packet by mouth daily as needed (for headaches).      Colleen Can FE 1/20 1-20  MG-MCG tablet Take 1 tablet by mouth daily.      Marland Kitchen LORazepam (ATIVAN) 2 MG tablet Take 1 tablet (2 mg total) by mouth every 8 (eight) hours as needed for anxiety.  15 tablet  0   No current facility-administered medications for this visit.    Physical Exam: Filed Vitals:   11/26/12 1144  BP: 146/90  Pulse: 72  Height: 5\' 4"  (1.626 m)  Weight: 164 lb 3.2 oz (74.481 kg)    GEN- The patient is tearful appearing when discussing her job stress, alert and oriented x 3 today.   Head- normocephalic, atraumatic Eyes-  Sclera clear, conjunctiva pink Ears- hearing intact Oropharynx- clear Lungs- Clear to ausculation bilaterally, normal work of breathing Chest- pacemaker pocket is well healed Heart- Regular rate and rhythm, no murmurs, rubs or gallops, PMI not laterally displaced GI- soft, NT, ND, + BS Extremities- no clubbing, cyanosis, or edema, arm swelling is resolved  Pacemaker interrogation- reviewed in detail today,  See PACEART report  Assessment and Plan:  1. Complete heart block Normal pacemaker function See Arita Miss Art report Doing very well  2. Palpitations- resolved  3. Swelling- resolved  4. Situational anxiety/ depression (job related)-  Lifestyle modification including stress relieve, adequate rest, and regular exercise were discussed today.  I have encouraged her to find a PCP  to discuss this and consider SSRI therapy.  I have referred to Dr Laury Axon.  I have discouraged xanax and ativan which could be addictive long term  5. Tobacco/ ETOH- cessation encouraged today  Return in 3 months

## 2013-01-13 ENCOUNTER — Telehealth: Payer: Self-pay

## 2013-01-13 NOTE — Telephone Encounter (Signed)
Medication and allergies: reviewed and updated  90 day supply/mail order: na Local pharmacy: [facility]   Immunizations due:  Declines flu vaccine  A/P:   Last Pap--10/2012--Dr Powell--[facility]--neg Has not had a period in 2 months  To Discuss with Provider: Would like a pregnancy blood test Discuss anxiety issues Per Dr Jenel Lucks last OV--11/26/2012--patient may need SSRI instead of treating anxiety with ativan and xanax

## 2013-01-14 ENCOUNTER — Ambulatory Visit (INDEPENDENT_AMBULATORY_CARE_PROVIDER_SITE_OTHER): Payer: 59 | Admitting: Family Medicine

## 2013-01-14 ENCOUNTER — Encounter: Payer: Self-pay | Admitting: Family Medicine

## 2013-01-14 VITALS — BP 110/76 | HR 97 | Temp 98.5°F | Ht 64.0 in | Wt 161.4 lb

## 2013-01-14 DIAGNOSIS — F411 Generalized anxiety disorder: Secondary | ICD-10-CM

## 2013-01-14 DIAGNOSIS — I442 Atrioventricular block, complete: Secondary | ICD-10-CM

## 2013-01-14 DIAGNOSIS — R319 Hematuria, unspecified: Secondary | ICD-10-CM

## 2013-01-14 DIAGNOSIS — N912 Amenorrhea, unspecified: Secondary | ICD-10-CM

## 2013-01-14 LAB — CBC WITH DIFFERENTIAL/PLATELET
Basophils Absolute: 0.1 10*3/uL (ref 0.0–0.1)
Eosinophils Absolute: 0.2 10*3/uL (ref 0.0–0.7)
HCT: 40.5 % (ref 36.0–46.0)
Lymphs Abs: 2.7 10*3/uL (ref 0.7–4.0)
MCHC: 33.6 g/dL (ref 30.0–36.0)
Monocytes Absolute: 0.6 10*3/uL (ref 0.1–1.0)
Monocytes Relative: 6.5 % (ref 3.0–12.0)
Platelets: 312 10*3/uL (ref 150.0–400.0)
RDW: 13.9 % (ref 11.5–14.6)

## 2013-01-14 LAB — LIPID PANEL
Cholesterol: 135 mg/dL (ref 0–200)
HDL: 63.7 mg/dL (ref >39.00)
LDL Cholesterol: 59 mg/dL (ref 0–99)
Triglycerides: 64 mg/dL (ref 0.0–149.0)
VLDL: 12.8 mg/dL (ref 0.0–40.0)

## 2013-01-14 LAB — POCT URINALYSIS DIPSTICK
Ketones, UA: NEGATIVE
Protein, UA: NEGATIVE
Spec Grav, UA: 1.015
Urobilinogen, UA: 0.2
pH, UA: 6.5

## 2013-01-14 LAB — T3, FREE: T3, Free: 2.8 pg/mL (ref 2.3–4.2)

## 2013-01-14 LAB — BASIC METABOLIC PANEL
BUN: 13 mg/dL (ref 6–23)
CO2: 25 mEq/L (ref 19–32)
GFR: 82.26 mL/min (ref >60.00)
Glucose, Bld: 86 mg/dL (ref 70–99)
Potassium: 4.2 mEq/L (ref 3.5–5.1)

## 2013-01-14 LAB — HEPATIC FUNCTION PANEL
AST: 22 U/L (ref 0–37)
Albumin: 4.7 g/dL (ref 3.5–5.2)
Total Bilirubin: 0.8 mg/dL (ref 0.3–1.2)

## 2013-01-14 LAB — T4, FREE: Free T4: 0.76 ng/dL (ref 0.60–1.60)

## 2013-01-14 MED ORDER — VORTIOXETINE HBR 10 MG PO TABS: 10.0000 mg | ORAL_TABLET | Freq: Every day | ORAL | Status: DC

## 2013-01-14 NOTE — Progress Notes (Signed)
  Subjective:     [name] [name] is a 37 y.o. female who presents for new evaluation and treatment of anxiety disorder, panic attacks and depression. She has the following anxiety symptoms: difficulty concentrating, feelings of losing control, insomnia and panic attacks. Onset of symptoms was approximately several months ago. Symptoms have been gradually worsening since that time. She denies current suicidal and homicidal ideation. Family history significant for no psychiatric illness. Risk factors: negative life event complete heart block and pacemaker. Previous treatment includes Ativan and Xanax. She complains of the following medication side effects: none. The following portions of the patient's history were reviewed and updated as appropriate: allergies, current medications, past family history, past medical history, past social history, past surgical history and problem list.  Review of Systems Pertinent items are noted in HPI.    Objective:    BP 110/76  Pulse 97  Temp(Src) 98.5 F (36.9 C) (Oral)  Ht 5\' 4"  (1.626 m)  Wt 161 lb 6.4 oz (73.211 kg)  BMI 27.69 kg/m2  SpO2 98% General appearance: alert, cooperative, appears stated age and no distress Neck: no adenopathy, supple, symmetrical, trachea midline and thyroid not enlarged, symmetric, no tenderness/mass/nodules Lungs: clear to auscultation bilaterally Heart: S1, S2 normal Psych--+ teary eyed   + anxious      Assessment:    anxiety disorder and panic attacks. Possible organic contributing causes are: none.   Plan:    Medications: Xanax and Brintellix 10 mg. Labs: TSH. Instructed patient to contact office or on-call physician promptly should condition worsen or any new symptoms appear and provided on-call telephone numbers. IF THE PATIENT HAS ANY SUICIDAL OR HOMICIDAL IDEATIONS, CALL THE OFFICE, DISCUSS WITH A SUPPORT MEMBER, OR GO TO THE ER IMMEDIATELY. Patient was agreeable with this plan. Follow up: 1 month. Spent  30 minutes (>50% of visit) discussing the risks of anxiety disorder and panic attacks, the  pathophysiology, etiology, risks, and principles of treatment.

## 2013-01-14 NOTE — Patient Instructions (Addendum)
  Rto 1 month--- f/u anxiety    Generalized Anxiety Disorder Generalized anxiety disorder (GAD) is a mental disorder. It interferes with life functions, including relationships, work, and school. GAD is different from normal anxiety, which everyone experiences at some point in their lives in response to specific life events and activities. Normal anxiety actually helps [place] prepare for and get through these life events and activities. Normal anxiety goes away after the event or activity is over.  GAD causes anxiety that is not necessarily related to specific events or activities. It also causes excess anxiety in proportion to specific events or activities. The anxiety associated with GAD is also difficult to control. GAD can vary from mild to severe. People with severe GAD can have intense waves of anxiety with physical symptoms (panic attacks).  SYMPTOMS The anxiety and worry associated with GAD are difficult to control. This anxiety and worry are related to many life events and activities and also occur more days than not for 6 months or longer. People with GAD also have three or more of the following symptoms (one or more in children):  Restlessness.   Fatigue.  Difficulty concentrating.   Irritability.  Muscle tension.  Difficulty sleeping or unsatisfying sleep. DIAGNOSIS GAD is diagnosed through an assessment by your caregiver. Your caregiver will ask you questions aboutyour mood,physical symptoms, and events in your life. Your caregiver may ask you about your medical history and use of alcohol or drugs, including prescription medications. Your caregiver may also do a physical exam and blood tests. Certain medical conditions and the use of certain substances can cause symptoms similar to those associated with GAD. Your caregiver may refer you to a mental health specialist for further evaluation. TREATMENT The following therapies are usually used to treat GAD:   Medication  Antidepressant medication usually is prescribed for long-term daily control. Antianxiety medications may be added in severe cases, especially when panic attacks occur.   Talk therapy (psychotherapy) Certain types of talk therapy can be helpful in treating GAD by providing support, education, and guidance. A form of talk therapy called cognitive behavioral therapy can teach you healthy ways to think about and react to daily life events and activities.  Stress managementtechniques These include yoga, meditation, and exercise and can be very helpful when they are practiced regularly. A mental health specialist can help determine which treatment is best for you. Some people see improvement with one therapy. However, other people require a combination of therapies. Document Released: 06/23/2012 Document Reviewed: 06/23/2012 [facility] Patient Information 2014 [place], [place].

## 2013-01-15 LAB — URINE CULTURE: Colony Count: NO GROWTH

## 2013-01-19 ENCOUNTER — Encounter: Payer: Self-pay | Admitting: *Deleted

## 2013-01-26 ENCOUNTER — Telehealth: Payer: Self-pay | Admitting: *Deleted

## 2013-01-26 MED ORDER — ALPRAZOLAM 0.5 MG PO TABS: 0.5000 mg | ORAL_TABLET | Freq: Three times a day (TID) | ORAL | Status: DC | PRN

## 2013-01-26 NOTE — Telephone Encounter (Signed)
i don't feel comfortable giving 1 mg tid -------  With the new med she should not need 1 mg -------  Tid------0.5 mg 1 po tid #60

## 2013-01-26 NOTE — Telephone Encounter (Signed)
Left message on voicemail to RTC to office. Medication was filled and sent to pharmacy.

## 2013-01-26 NOTE — Telephone Encounter (Signed)
Patient called and stated that she spoke with Dr. Laury Axon at her last visit about prescription for Xanax. Per patient Dr,. Lowne was suppose to call in medication to her pharmacy but the pharmacy has yet to receive anythingLast seen on 01/14/2013. Medication was prescribed by historical provider. Please advise. SW

## 2013-02-09 ENCOUNTER — Emergency Department (HOSPITAL_BASED_OUTPATIENT_CLINIC_OR_DEPARTMENT_OTHER): Payer: 59

## 2013-02-09 ENCOUNTER — Emergency Department (HOSPITAL_BASED_OUTPATIENT_CLINIC_OR_DEPARTMENT_OTHER)
Admission: EM | Admit: 2013-02-09 | Discharge: 2013-02-09 | Disposition: A | Discharge status: Home / Self Care | Payer: 59 | Attending: Emergency Medicine | Admitting: Emergency Medicine

## 2013-02-09 DIAGNOSIS — F329 Major depressive disorder, single episode, unspecified: Secondary | ICD-10-CM | POA: Insufficient documentation

## 2013-02-09 DIAGNOSIS — R55 Syncope and collapse: Secondary | ICD-10-CM

## 2013-02-09 DIAGNOSIS — Z950 Presence of cardiac pacemaker: Secondary | ICD-10-CM | POA: Insufficient documentation

## 2013-02-09 DIAGNOSIS — Z8742 Personal history of other diseases of the female genital tract: Secondary | ICD-10-CM | POA: Insufficient documentation

## 2013-02-09 DIAGNOSIS — F17200 Nicotine dependence, unspecified, uncomplicated: Secondary | ICD-10-CM | POA: Insufficient documentation

## 2013-02-09 DIAGNOSIS — F419 Anxiety disorder, unspecified: Secondary | ICD-10-CM

## 2013-02-09 DIAGNOSIS — Z79899 Other long term (current) drug therapy: Secondary | ICD-10-CM | POA: Insufficient documentation

## 2013-02-09 DIAGNOSIS — Z8619 Personal history of other infectious and parasitic diseases: Secondary | ICD-10-CM | POA: Insufficient documentation

## 2013-02-09 DIAGNOSIS — R110 Nausea: Secondary | ICD-10-CM | POA: Insufficient documentation

## 2013-02-09 DIAGNOSIS — F3289 Other specified depressive episodes: Secondary | ICD-10-CM | POA: Insufficient documentation

## 2013-02-09 DIAGNOSIS — R51 Headache: Secondary | ICD-10-CM | POA: Insufficient documentation

## 2013-02-09 DIAGNOSIS — Z8719 Personal history of other diseases of the digestive system: Secondary | ICD-10-CM | POA: Insufficient documentation

## 2013-02-09 DIAGNOSIS — Z3202 Encounter for pregnancy test, result negative: Secondary | ICD-10-CM | POA: Insufficient documentation

## 2013-02-09 DIAGNOSIS — R0602 Shortness of breath: Secondary | ICD-10-CM | POA: Insufficient documentation

## 2013-02-09 DIAGNOSIS — Z8679 Personal history of other diseases of the circulatory system: Secondary | ICD-10-CM | POA: Insufficient documentation

## 2013-02-09 DIAGNOSIS — F411 Generalized anxiety disorder: Secondary | ICD-10-CM | POA: Insufficient documentation

## 2013-02-09 DIAGNOSIS — R42 Dizziness and giddiness: Secondary | ICD-10-CM | POA: Insufficient documentation

## 2013-02-09 LAB — CBC WITH DIFFERENTIAL/PLATELET
Basophils Absolute: 0 10*3/uL (ref 0.0–0.1)
Basophils Relative: 0 % (ref 0–1)
Eosinophils Absolute: 0.1 10*3/uL (ref 0.0–0.7)
Eosinophils Relative: 0 % (ref 0–5)
HCT: 40.5 % (ref 36.0–46.0)
MCHC: 33.8 g/dL (ref 30.0–36.0)
MCV: 90.6 fL (ref 78.0–100.0)
Monocytes Absolute: 0.8 10*3/uL (ref 0.1–1.0)
Platelets: 255 10*3/uL (ref 150–400)
RDW: 13.2 % (ref 11.5–15.5)
WBC: 11.6 10*3/uL — ABNORMAL HIGH (ref 4.0–10.5)

## 2013-02-09 LAB — BASIC METABOLIC PANEL
BUN: 10 mg/dL (ref 6–23)
CO2: 24 mEq/L (ref 19–32)
Calcium: 10.9 mg/dL — ABNORMAL HIGH (ref 8.4–10.5)
Chloride: 104 mEq/L (ref 96–112)
Creatinine, Ser: 0.7 mg/dL (ref 0.50–1.10)

## 2013-02-09 LAB — URINALYSIS, ROUTINE W REFLEX MICROSCOPIC
Bilirubin Urine: NEGATIVE
Ketones, ur: NEGATIVE mg/dL
Leukocytes, UA: NEGATIVE
Nitrite: NEGATIVE
Protein, ur: NEGATIVE mg/dL
pH: 6.5 (ref 5.0–8.0)

## 2013-02-09 LAB — PREGNANCY, URINE: Preg Test, Ur: NEGATIVE

## 2013-02-09 LAB — URINE MICROSCOPIC-ADD ON

## 2013-02-09 MED ORDER — HYDROCODONE-ACETAMINOPHEN 5-325 MG PO TABS: 1.0000 | ORAL_TABLET | ORAL | Status: DC | PRN

## 2013-02-09 MED ORDER — LORAZEPAM 2 MG/ML IJ SOLN
1.0000 mg | Freq: Once | INTRAMUSCULAR | Status: AC
  Administered 2013-02-09: 1 mg via INTRAVENOUS
  Filled 2013-02-09: qty 1

## 2013-02-09 MED ORDER — ONDANSETRON 4 MG PO TBDP
4.0000 mg | ORAL_TABLET | Freq: Once | ORAL | Status: AC
  Administered 2013-02-09: 4 mg via ORAL
  Filled 2013-02-09: qty 1

## 2013-02-09 MED ORDER — ONDANSETRON HCL 4 MG PO TABS: 4.0000 mg | ORAL_TABLET | Freq: Four times a day (QID) | ORAL | Status: DC

## 2013-02-09 MED ORDER — HYDROCODONE-ACETAMINOPHEN 5-325 MG PO TABS
1.0000 | ORAL_TABLET | Freq: Once | ORAL | Status: AC
  Administered 2013-02-09: 1 via ORAL
  Filled 2013-02-09: qty 1

## 2013-02-09 NOTE — ED Provider Notes (Addendum)
CSN: [id number]     Arrival date & time 02/09/13  1145 History   First MD Initiated Contact with Patient 02/09/13 1149     Chief Complaint  Patient presents with  . Loss of Consciousness   (Consider location/radiation/quality/duration/timing/severity/associated sxs/prior Treatment) Patient is a 37 y.o. female presenting with syncope. The history is provided by the patient and the EMS personnel.  Loss of Consciousness Episode history:  Single Most recent episode:  Today Timing:  Constant Progression:  Resolved Chronicity:  New Context comment:  Pt was at work and started to feel SOB, panicked, lightheaded and went to get a drink of water and felt like she was going to pass out and sat down in the chair and then had a syncopal event Witnessed: yes   Relieved by:  Lying down Worsened by:  Nothing tried Ineffective treatments:  None tried Associated symptoms: anxiety, headaches, nausea and shortness of breath   Associated symptoms: no chest pain, no confusion, no diaphoresis, no focal sensory loss, no focal weakness, no palpitations, no recent surgery, no vomiting and no weakness   Associated symptoms comment:  Headache started after she hit her head during the syncopal event. 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 Abnormal Pap smear 2004    colpo  . H/O candidiasis   . H/O varicella 2005  . Pre-eclampsia     H/O  . Pelvic pain in female 2007  . Candida vaginitis 05/2006  . IBS (irritable bowel syndrome)   . 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 Tonsillectomy  1995  . Cholecystectomy    . Ep study and ablation  04/29/12    slow pathway ablation by Dr Johney Frame   . Pacemaker insertion  04/30/12    Medtronic Adapta L implanted by Dr Ladona Ridgel for  complete heart block   Family History  Problem Relation Age of Onset  . Diabetes Mother   . Cancer Sister     Lump removed frfom shoulder  . Heart disease Maternal Grandfather     MI  . Cancer Paternal Grandmother     breast   History  Substance Use Topics  . Smoking status: Current Some Day Smoker -- 0.50 packs/day    Types: Cigarettes  . Smokeless tobacco: Never Used  . Alcohol Use: Yes     Comment: 2 glasses of beer 3-4 times per week   OB History   Grav Para Term Preterm Abortions TAB SAB Ect Mult Living   1 1 1       1      Review of Systems  Constitutional: Negative for diaphoresis.  Respiratory: Positive for shortness of breath.   Cardiovascular: Positive for syncope. Negative for chest pain and palpitations.  Gastrointestinal: Positive for nausea. Negative for vomiting.  Neurological: Positive for headaches. Negative for focal weakness and weakness.  Psychiatric/Behavioral: Negative for confusion. The patient is nervous/anxious.        Frequent panic attacks and taking xanax but has not taken any for several days due to feeling better  All other systems reviewed and are negative.    Allergies  Review of patient's allergies indicates no known allergies.  Home Medications   Current Outpatient Rx  Name  Route  Sig  Dispense  Refill  . ALPRAZolam (XANAX) 0.5 MG tablet   Oral   Take 1 tablet (0.5 mg total) by mouth 3 (three) times daily as needed for anxiety.   60 tablet   0   . Aspirin-Salicylamide-Caffeine (BC HEADACHE POWDER PO)   Oral   Take 1 packet by mouth daily as needed (for headaches).         Colleen Can FE 1/20 1-20 MG-MCG tablet   Oral   Take 1 tablet by mouth daily.         . Vortioxetine HBr (BRINTELLIX) 10 MG TABS   Oral   Take 1 tablet (10 mg total) by mouth daily.   30 tablet   5    There were no vitals taken for this visit. Physical Exam  Nursing note and vitals reviewed. Constitutional: She is oriented to person, place, and  time. She appears well-developed and well-nourished. No distress.  Speaks in a soft voice and occasionally slow to respond  HENT:  Head: Normocephalic and atraumatic.  Mouth/Throat: Oropharynx is clear and moist.  Eyes: Conjunctivae and EOM are normal. Pupils are equal, round, and reactive to light.  Neck: Normal range of motion. Neck supple. No spinous process tenderness and no muscular tenderness present.  Cardiovascular: Normal rate, regular rhythm and intact distal pulses.   No murmur heard. Pulmonary/Chest: Effort normal and breath sounds normal. No respiratory distress. She has no wheezes. She has no rales.  Abdominal: Soft. She exhibits no distension. There is no tenderness. There is no rebound and no guarding.  Musculoskeletal: Normal range of motion. She exhibits no edema and no tenderness.  Neurological: She is alert and oriented to person, place, and time. She has normal strength. No cranial nerve deficit or sensory deficit.  Skin: Skin is warm and dry. No rash noted. No erythema.  Psychiatric: She has a normal mood and affect. Her behavior is normal.    ED Course  Procedures (including critical care time) Labs Review Labs Reviewed  URINALYSIS, ROUTINE W REFLEX MICROSCOPIC - Abnormal; Notable for the following:    Hgb urine dipstick MODERATE (*)    All other components within normal limits  BASIC METABOLIC PANEL - Abnormal; Notable for the following:    Glucose, Bld 112 (*)    Calcium 10.9 (*)    All other components within normal limits  CBC WITH DIFFERENTIAL - Abnormal; Notable for the following:    WBC 11.6 (*)    Neutro Abs 8.0 (*)    All other components within normal limits  URINE MICROSCOPIC-ADD ON - Abnormal; Notable for the following:    Bacteria, UA MANY (*)    All other components within normal limits  URINE CULTURE  PREGNANCY, URINE   Imaging Review Dg Chest 2 View  02/09/2013   CLINICAL DATA:  Syncope  EXAM: CHEST  2 VIEW  COMPARISON:  November 05, 2012   FINDINGS: Lungs are clear. Heart size and pulmonary vascularity are normal. No adenopathy. Pacemaker leads appear attached to the right atrium and right ventricle, stable. No pneumothorax.  IMPRESSION: No edema or consolidation.   Electronically Signed   By: Bretta Bang M.D.   On: 02/09/2013 13:17   Ct Head Wo Contrast  02/09/2013   CLINICAL DATA:  Syncopal episode,, dizziness, history of cardiac dysrhythmia  EXAM: CT HEAD WITHOUT CONTRAST  TECHNIQUE: Contiguous axial images were obtained from the base of the skull through the vertex without intravenous contrast.  COMPARISON:  CT scan of the brain  dated October 20, 2007.  FINDINGS: The ventricles are normal in size and position. There is no intracranial hemorrhage nor intracranial mass effect. There is no evidence of an evolving ischemic infarction. The cerebellum and brainstem are normal in density. There are no abnormal intracranial calcifications.  At bone window settings there is no evidence of an acute skull fracture. The observed portions of the paranasal sinuses are clear.  IMPRESSION: 1. There is no evidence of acute intracranial hemorrhage nor of an evolving ischemic infarction. 2. There is no intracranial mass effect or hydrocephalus. 3. There is no evidence of an acute skull fracture. The observed portions of the paranasal sinuses are clear.   Electronically Signed   By: David  [place]   On: 02/09/2013 13:06    EKG Interpretation    Date/Time:  Monday February 09 2013 11:47:06 EST Ventricular Rate:  85 PR Interval:  124 QRS Duration: 140 QT Interval:  412 QTC Calculation: 490 R Axis:   -77 Text Interpretation:  Atrial-sensed ventricular-paced rhythm No significant change since last tracing Confirmed by Anitra Lauth  MD, Kenlie Seki ([id number]) on 02/09/2013 12:15:51 PM            MDM   1. Syncope   2. Anxiety     Patient with a history of anxiety, depression, AVNRT, complete heart block and pacemaker placement in February of this year  who presents today after a syncopal episode she had at work. Patient states she started to feel very hot and flushed and short or breath. She was having trouble speaking and felt very panicked and then when she got up to get some water she had a syncopal event. Patient denies any palpitations, chest pain or any trouble with her pacemaker. It was last interrogated in September and appeared to be working well. She states she's had a lot of stress at work and had not taken any Xanax today and feels that anxiety. When she syncopized she fell out of a chair and hit her head and has now had a headache and some nausea. She is complaining of shortness of breath but is able to speak in complete sentences and then satting 100% on room air.  Patient is hypertensive here and states she does not normally have hypertension and does not take medicine for hypertension.  She has no focal neurologic deficits but does have some mild photophobia.  Head CT, CBC, BMP pending. Patient has not had a normal menstrual period in months so we'll check a UPT prior to imaging. EKG shows a normally paced rhythm it is unchanged from prior.  Doubt today the fever is related to her dysrhythmia and her pacemaker appears to be working normally on EKG. Feel most likely patient's symptoms today are related to her anxiety.  1:44 PM Labs wnl and repeat BP improved.  Anxiety improved but still having headache and nausea most likely concussion from hitting head. Feel pt sx due to anxiety and d/ced home.  Gwyneth Sprout, MD 02/09/13 1216  Gwyneth Sprout, MD 02/09/13 1344  Gwyneth Sprout, MD 02/09/13 [phone]

## 2013-02-09 NOTE — ED Notes (Signed)
Pt brought via [facility] to room 2 for syncope and dizziness. Pt has pacemaker. EMS was told this facility is unable to interrogate pacemaker per Dr. Anitra Lauth. Pt was adamant about coming to [facility] and EMS brought pt here. Pt alert upon arrival.

## 2013-02-09 NOTE — ED Notes (Signed)
Patient transported to CT 

## 2013-02-09 NOTE — ED Notes (Signed)
Pt at work this am, stood up from desk and had syncopal episode. Pacemaker placed in February. No chest pain. Reports feels lightheaded/as if room is spinning.

## 2013-02-09 NOTE — ED Notes (Signed)
Patient ambulatory to restroom without difficulty. 

## 2013-02-10 LAB — URINE CULTURE: Colony Count: >=100000

## 2013-02-11 ENCOUNTER — Encounter: Payer: Self-pay | Admitting: Internal Medicine

## 2013-02-11 ENCOUNTER — Ambulatory Visit (INDEPENDENT_AMBULATORY_CARE_PROVIDER_SITE_OTHER): Payer: 59 | Admitting: Family Medicine

## 2013-02-11 ENCOUNTER — Encounter: Payer: Self-pay | Admitting: Family Medicine

## 2013-02-11 ENCOUNTER — Ambulatory Visit (INDEPENDENT_AMBULATORY_CARE_PROVIDER_SITE_OTHER): Payer: 59 | Admitting: Internal Medicine

## 2013-02-11 VITALS — BP 108/76 | HR 80 | Temp 97.8°F | Wt 162.0 lb

## 2013-02-11 VITALS — BP 126/83 | HR 87 | Ht 64.0 in | Wt 161.8 lb

## 2013-02-11 DIAGNOSIS — I442 Atrioventricular block, complete: Secondary | ICD-10-CM

## 2013-02-11 DIAGNOSIS — N390 Urinary tract infection, site not specified: Secondary | ICD-10-CM

## 2013-02-11 DIAGNOSIS — R42 Dizziness and giddiness: Secondary | ICD-10-CM

## 2013-02-11 DIAGNOSIS — R55 Syncope and collapse: Secondary | ICD-10-CM | POA: Insufficient documentation

## 2013-02-11 DIAGNOSIS — F411 Generalized anxiety disorder: Secondary | ICD-10-CM | POA: Insufficient documentation

## 2013-02-11 DIAGNOSIS — R197 Diarrhea, unspecified: Secondary | ICD-10-CM

## 2013-02-11 LAB — POCT URINALYSIS DIPSTICK
Glucose, UA: NEGATIVE
Nitrite, UA: NEGATIVE
Protein, UA: NEGATIVE
Spec Grav, UA: 1.015
Urobilinogen, UA: 1
pH, UA: 6.5

## 2013-02-11 LAB — CBC WITH DIFFERENTIAL/PLATELET
Basophils Relative: 0.7 % (ref 0.0–3.0)
Eosinophils Absolute: 0.2 10*3/uL (ref 0.0–0.7)
Eosinophils Relative: 2 % (ref 0.0–5.0)
Hemoglobin: 14.1 g/dL (ref 12.0–15.0)
MCHC: 33.8 g/dL (ref 30.0–36.0)
MCV: 90.1 fl (ref 78.0–100.0)
Monocytes Absolute: 0.5 10*3/uL (ref 0.1–1.0)
Neutro Abs: 4.5 10*3/uL (ref 1.4–7.7)
RBC: 4.64 Mil/uL (ref 3.87–5.11)
RDW: 13.6 % (ref 11.5–14.6)
WBC: 8 10*3/uL (ref 4.5–10.5)

## 2013-02-11 LAB — MDC_IDC_ENUM_SESS_TYPE_INCLINIC
Battery Impedance: 110 Ohm
Battery Remaining Longevity: 132 mo
Battery Voltage: 2.8 V
Brady Statistic AP VP Percent: 1 %
Brady Statistic AP VS Percent: 0 %
Brady Statistic AS VP Percent: 99 %
Date Time Interrogation Session: 20141203125855
Lead Channel Pacing Threshold Amplitude: 0.75 V
Lead Channel Setting Pacing Amplitude: 2 V
Lead Channel Setting Pacing Amplitude: 2.5 V
Lead Channel Setting Pacing Pulse Width: 0.4 ms

## 2013-02-11 LAB — BASIC METABOLIC PANEL
CO2: 28 mEq/L (ref 19–32)
Chloride: 103 mEq/L (ref 96–112)
Creatinine, Ser: 0.7 mg/dL (ref 0.4–1.2)
Glucose, Bld: 90 mg/dL (ref 70–99)
Potassium: 4 mEq/L (ref 3.5–5.1)
Sodium: 137 mEq/L (ref 135–145)

## 2013-02-11 LAB — HEPATIC FUNCTION PANEL
ALT: 15 U/L (ref 0–35)
Albumin: 4.5 g/dL (ref 3.5–5.2)
Alkaline Phosphatase: 33 U/L — ABNORMAL LOW (ref 39–117)
Total Bilirubin: 0.5 mg/dL (ref 0.3–1.2)
Total Protein: 7.6 g/dL (ref 6.0–8.3)

## 2013-02-11 LAB — TSH: TSH: 0.87 u[IU]/mL (ref 0.35–5.50)

## 2013-02-11 MED ORDER — VORTIOXETINE HBR 5 MG PO TABS: ORAL_TABLET | ORAL | Status: DC

## 2013-02-11 NOTE — Patient Instructions (Signed)

## 2013-02-11 NOTE — Patient Instructions (Addendum)
Your physician wants you to follow-up in: 6 months with Dr Jacquiline Doe will receive a reminder letter in the mail two months in advance. If you don't receive a letter, please call our office to schedule the follow-up appointment.   Remote monitoring is used to monitor your Pacemaker or ICD from home. This monitoring reduces the number of office visits required to check your device to one time per year. It allows [place] to keep an eye on the functioning of your device to ensure it is working properly. You are scheduled for a device check from home on 05/14/13. You may send your transmission at any time that day. If you have a wireless device, the transmission will be sent automatically. After your physician reviews your transmission, you will receive a postcard with your next transmission date.

## 2013-02-11 NOTE — Assessment & Plan Note (Signed)
Labs from er reviewed Repeat today

## 2013-02-11 NOTE — Addendum Note (Signed)
Addended by: Silvio Pate D on: 02/11/2013 03:31 PM   Modules accepted: Orders

## 2013-02-11 NOTE — Progress Notes (Signed)
[name] [name] is a 37 y.o. female who presents today for electrophysiology followup.  She was recently seen in the ER with postural syncope.  She reports that she stood quickly and became lightheaded.  She then passed out.  She has occasional postural dizziness.  She reports ongoing difficulty with work related anxiety.  She continues to drink modest ETOH.  She has seen Dr Laury Axon this week who has recommended daily medicine for anxiety and depression.  She has not been interested in starting this medicine thus far.  She takes xanax for anxiety presently.  Today, she denies symptoms of palpitations, chest pain, shortness of breath, or further syncope.  She is tearful as she talks about the stresses of her job today.   The patient is otherwise without complaint today.   Past Medical History  Diagnosis Date  . Abnormal Pap smear 2004    colpo  . H/O candidiasis   . H/O varicella 2005  . Pre-eclampsia     H/O  . Pelvic pain in female 2007  . Candida vaginitis 05/2006  . IBS (irritable bowel syndrome)   . 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 Tonsillectomy  1995  . Cholecystectomy    . Ep study and ablation  04/29/12    slow pathway ablation by Dr Johney Frame   . Pacemaker insertion  04/30/12    Medtronic Adapta L implanted by Dr Ladona Ridgel for complete heart block    Current Outpatient Prescriptions  Medication Sig Dispense Refill  . ALPRAZolam (XANAX) 0.5 MG tablet Take 1 tablet (0.5 mg total) by mouth 3 (three) times daily as needed for anxiety.  60 tablet  0  . Aspirin-Salicylamide-Caffeine (BC HEADACHE POWDER PO) Take 1 packet by mouth daily as needed (for headaches).      Marland Kitchen HYDROcodone-acetaminophen (NORCO/VICODIN) 5-325 MG per tablet Take 1 tablet by mouth every 4 (four) hours as needed.  10 tablet  0  . JUNEL FE 1/20 1-20 MG-MCG  tablet Take 1 tablet by mouth daily.      . ondansetron (ZOFRAN) 4 MG tablet Take 1 tablet (4 mg total) by mouth every 6 (six) hours.  12 tablet  0  . Vortioxetine HBr 5 MG TABS Take 1 tablet by mouth daily.        No current facility-administered medications for this visit.    Physical Exam: Filed Vitals:   02/11/13 0954  BP: 126/83  Pulse: 87  Height: 5\' 4"  (1.626 m)  Weight: 161 lb 12.8 oz (73.392 kg)    GEN- The patient is tearful appearing when discussing her stress, alert and oriented x 3 today.   Head- normocephalic, atraumatic Eyes-  Sclera clear, conjunctiva pink Ears- hearing intact Oropharynx- clear Lungs- Clear to ausculation bilaterally, normal work of breathing Chest- pacemaker pocket is well healed Heart- Regular rate and rhythm, no murmurs, rubs or gallops, PMI not laterally displaced GI- soft, NT, ND, + BS Extremities- no clubbing, cyanosis, or edema, arm swelling is resolved  Pacemaker interrogation- reviewed in detail today,  See PACEART report  Assessment and Plan:  1. Complete heart block Normal pacemaker function See Arita Miss Art report Doing very well  2. Palpitations- resolved   3. Situational anxiety/ depression (job related)-  Lifestyle modification including stress relieve, adequate rest, and SSRI therapy compliance was encouraged today. I appreciate  Dr Ernst Spell input.  I have discouraged xanax and ativan which could be addictive long term  5. Tobacco/ ETOH- cessation encouraged today  6. Dizziness/ recent syncope She is orthostatic by HR today.  While standing, she developed dizziness but did not pass out.  She had tremulousness, tears, and difficulty talking when standing, though her BP at that time was 120/86 and HR 98.  I have encouraged adequate hydration and ETOH avoidance.  Consider support hose if not improved.  [facility]link in 3 months I will see again in 6 months

## 2013-02-11 NOTE — Progress Notes (Signed)
Pre visit review using our clinic review tool, if applicable. No additional management support is needed unless otherwise documented below in the visit note. 

## 2013-02-11 NOTE — Progress Notes (Signed)
   Subjective:    Patient ID: [name] [name], female    DOB: 1975-08-24, 37 y.o.   MRN: [id number]  HPI Pt here f/u ER for syncope and dizziness.  She has an appointment with cardiology after here.   Her anxiety is worse.  She is not taking brintellix regularly because of nausea   Review of Systems    as above Objective:   Physical Exam BP 108/76  Pulse 80  Temp(Src) 97.8 F (36.6 C) (Oral)  Wt 162 lb (73.483 kg)  SpO2 99% General appearance: alert, cooperative, appears stated age and no distress Lungs: clear to auscultation bilaterally Heart: S1, S2 normal Neurologic: Alert and oriented X 3, normal strength and tone. Normal symmetric reflexes. Normal coordination and gait Psych-- + crying,  Very anxious      Assessment & Plan:

## 2013-02-11 NOTE — Assessment & Plan Note (Signed)
Lower dose brintellix 5 mg daily F/u 1 month or sooner prn con't xanax

## 2013-02-25 ENCOUNTER — Encounter: Payer: 59 | Admitting: Internal Medicine

## 2013-05-12 ENCOUNTER — Telehealth: Payer: Self-pay | Admitting: *Deleted

## 2013-05-12 ENCOUNTER — Telehealth: Payer: Self-pay | Admitting: Family Medicine

## 2013-05-12 DIAGNOSIS — F329 Major depressive disorder, single episode, unspecified: Secondary | ICD-10-CM

## 2013-05-12 DIAGNOSIS — F32A Depression, unspecified: Secondary | ICD-10-CM

## 2013-05-12 MED ORDER — VORTIOXETINE HBR 5 MG PO TABS: 1.0000 | ORAL_TABLET | Freq: Every day | ORAL | Status: DC

## 2013-05-12 NOTE — Telephone Encounter (Signed)
[facility]  [facility] [place] Patient made aware.   Samples (x2) provided Brintellix 5mg  Lot [id number] Ex April 2016

## 2013-05-12 NOTE — Telephone Encounter (Signed)
05/12/2013 Pt came by and picked up samples of Vortioxetine HBr 5 MG TABS.  [facility] .

## 2013-05-12 NOTE — Telephone Encounter (Signed)
Patient called and wanted to see if dr Laury Axonlowne had samples of Brintellix and also write her a rx for it as well.    [facility] [address]

## 2013-05-13 MED ORDER — VORTIOXETINE HBR 10 MG PO TABS: 10.0000 mg | ORAL_TABLET | Freq: Every day | ORAL | Status: DC

## 2013-05-13 NOTE — Telephone Encounter (Signed)
[facility]ix [facility]mg  [facility]

## 2013-05-14 ENCOUNTER — Encounter: Payer: 59 | Admitting: *Deleted

## 2013-05-21 ENCOUNTER — Telehealth: Payer: Self-pay

## 2013-05-21 NOTE — Telephone Encounter (Signed)
Called and stated that she [facility] [address] that a prior authorization was needed for her prescription for Brintellix 10 mg tabs.  This was confirmed by the pharmacy.   Patient also requested samples until this is resolved.  Samples (1 box) packaged and ready for pick.

## 2013-05-25 ENCOUNTER — Telehealth: Payer: Self-pay

## 2013-05-25 ENCOUNTER — Telehealth: Payer: Self-pay | Admitting: *Deleted

## 2013-05-25 NOTE — Telephone Encounter (Signed)
Prior authorization for Brintellix was approved effective 04/25/2013 through 05/25/2014. Approval letter sent to be scanned. JG//CMA

## 2013-05-25 NOTE — Telephone Encounter (Signed)
Prior authorization paperwork faxed for Brintellix 10 mg tablet. Awaiting response. JG//CMA

## 2013-05-25 NOTE — Telephone Encounter (Signed)
Brintellix has been approved by [employer] effective 04/25/13 through 05/25/2014.  Confirmation was sent via fax.  Patient was made aware.  [facility] [address] was also made aware of the approval.

## 2013-05-28 ENCOUNTER — Ambulatory Visit (INDEPENDENT_AMBULATORY_CARE_PROVIDER_SITE_OTHER): Payer: 59 | Admitting: Family Medicine

## 2013-05-28 ENCOUNTER — Encounter: Payer: Self-pay | Admitting: Family Medicine

## 2013-05-28 VITALS — BP 100/70 | HR 80 | Temp 98.0°F | Wt 160.0 lb

## 2013-05-28 DIAGNOSIS — F418 Other specified anxiety disorders: Secondary | ICD-10-CM

## 2013-05-28 DIAGNOSIS — F341 Dysthymic disorder: Secondary | ICD-10-CM

## 2013-05-28 NOTE — Progress Notes (Signed)
Pre visit review using our clinic review tool, if applicable. No additional management support is needed unless otherwise documented below in the visit note. 

## 2013-05-28 NOTE — Progress Notes (Signed)
Patient ID: [name] [name], female   DOB: Mar 31, 1975, 38 y.o.   MRN: [id number]   Subjective:    Patient ID: [name] NiemannKatherine H [name], female    DOB: Mar 31, 1975, 38 y.o.   MRN: [id number] HPI Pt here f/u anxiety/ depression.  Pt tried to cut her wrists on Monday.  Her husband was home and stopped her.  She did not go to the hospital.  [name] was in the office and sat and talked to her for a while.           Objective:    BP 100/70  Pulse 80  Temp(Src) 98 F (36.7 C) (Oral)  Wt 160 lb (72.576 kg)  SpO2 98% General appearance: alert, cooperative, appears stated age and no distress Lungs: clear to auscultation bilaterally Heart: S1, S2 normal Neurologic: Mental status: alertness: alert, orientation: time, date, person, place, affect: increased in intensity, when questioned about suicide, the patient expresses suicidal ideation with plan, no homicidal ideation         Assessment & Plan:  1. Depression with anxiety With suicidal ideation [name] spoke with pt and call out to [facility] for possible admission Change med to lexapro  con't xanax Out of work 1 month

## 2013-05-28 NOTE — Patient Instructions (Signed)

## 2013-05-29 ENCOUNTER — Telehealth: Payer: Self-pay | Admitting: Family Medicine

## 2013-05-29 ENCOUNTER — Encounter: Payer: Self-pay | Admitting: *Deleted

## 2013-05-29 NOTE — Telephone Encounter (Signed)
Relevant patient education assigned to patient using Emmi. ° °

## 2013-06-05 ENCOUNTER — Telehealth: Payer: Self-pay | Admitting: *Deleted

## 2013-06-05 DIAGNOSIS — Z0279 Encounter for issue of other medical certificate: Secondary | ICD-10-CM

## 2013-06-05 MED ORDER — ESCITALOPRAM OXALATE 10 MG PO TABS: 10.0000 mg | ORAL_TABLET | Freq: Every day | ORAL | Status: DC

## 2013-06-05 NOTE — Telephone Encounter (Signed)
Patient dropped off FMLA papework. Billing sheet attached, forms filled out as much as possible and placed in blue folder for Dr. Laury AxonLowne. JG//CMA

## 2013-06-05 NOTE — Telephone Encounter (Signed)
Pt called about medication that was changed at her appt 05/28/2013.  Chart states switching her to Lexapro.  She has not heard anything.  Lexapro not listed in her chart under current meds.  Please advise patient if you are writing her a prescription and if [facility] needs [facility] or if it will be called in to pharmacy. [facility] [address]

## 2013-06-05 NOTE — Telephone Encounter (Signed)
1. Depression with anxiety  With suicidal ideation  [name] [name] spoke with pt and call out to [facility] for possible admission  Change med to lexapro  con't xanax  Out of work 1 month   Discussed with Dr.Lowne and she stated to put the patient on Lexapro 10 mg 1 by mouth daily. The patient says she is also doing better. Follow up appointment scheduled for next week      KP

## 2013-06-08 NOTE — Telephone Encounter (Signed)
Received completed paperwork. Patient called and message left to inform her that paperwork is ready for pick up at our front desk. JG//CMA

## 2013-06-15 ENCOUNTER — Ambulatory Visit (INDEPENDENT_AMBULATORY_CARE_PROVIDER_SITE_OTHER): Payer: 59 | Admitting: Family Medicine

## 2013-06-15 ENCOUNTER — Encounter: Payer: Self-pay | Admitting: Family Medicine

## 2013-06-15 VITALS — BP 108/70 | HR 74 | Temp 97.7°F | Wt 162.0 lb

## 2013-06-15 DIAGNOSIS — F341 Dysthymic disorder: Secondary | ICD-10-CM

## 2013-06-15 DIAGNOSIS — F418 Other specified anxiety disorders: Secondary | ICD-10-CM

## 2013-06-15 NOTE — Progress Notes (Signed)
   Subjective:    Patient ID: [name] [name], female    DOB: 23-Apr-1975, 38 y.o.   MRN: [id number]  HPI Pt here f/u anxiety and depression.  Pt is doing much better and con't with counseling with [name].   No suicidal or homicidal ideations.  She has not needed xanax in several days .      Review of Systems As above    Objective:   Physical Exam  BP 108/70  Pulse 74  Temp(Src) 97.7 F (36.5 C) (Oral)  Wt 162 lb (73.483 kg)  SpO2 98% General appearance: alert, cooperative, appears stated age and no distress Neurologic: Alert and oriented X 3, normal strength and tone. Normal symmetric reflexes. Normal coordination and gait      Assessment & Plan:  1. Depression with anxiety con't lexapro and prn xanax rto 3 months or sooner prn

## 2013-06-15 NOTE — Progress Notes (Signed)
Pre visit review using our clinic review tool, if applicable. No additional management support is needed unless otherwise documented below in the visit note. 

## 2013-06-15 NOTE — Patient Instructions (Signed)

## 2013-06-16 ENCOUNTER — Telehealth: Payer: Self-pay | Admitting: Family Medicine

## 2013-06-16 NOTE — Telephone Encounter (Signed)
Relevant patient education assigned to patient using Emmi. ° °

## 2013-06-25 ENCOUNTER — Ambulatory Visit (INDEPENDENT_AMBULATORY_CARE_PROVIDER_SITE_OTHER): Payer: 59 | Admitting: Family Medicine

## 2013-06-25 ENCOUNTER — Encounter: Payer: Self-pay | Admitting: Family Medicine

## 2013-06-25 VITALS — BP 106/72 | HR 88 | Temp 98.2°F | Wt 164.0 lb

## 2013-06-25 DIAGNOSIS — F419 Anxiety disorder, unspecified: Secondary | ICD-10-CM

## 2013-06-25 DIAGNOSIS — F3289 Other specified depressive episodes: Secondary | ICD-10-CM

## 2013-06-25 DIAGNOSIS — F32A Depression, unspecified: Secondary | ICD-10-CM

## 2013-06-25 DIAGNOSIS — F329 Major depressive disorder, single episode, unspecified: Secondary | ICD-10-CM

## 2013-06-25 DIAGNOSIS — F411 Generalized anxiety disorder: Secondary | ICD-10-CM

## 2013-06-25 MED ORDER — ALPRAZOLAM 0.5 MG PO TABS: 0.5000 mg | ORAL_TABLET | Freq: Three times a day (TID) | ORAL | Status: DC | PRN

## 2013-06-25 NOTE — Progress Notes (Signed)
   Subjective:    Patient ID: [name] [name], female    DOB: 1975-07-19, 38 y.o.   MRN: [id number]  HPI Pt here to f/u depression/ anxiety.   Pt is doing well with counseling but needs her fmla extended.     Review of Systems As above    Objective:   Physical Exam BP 106/72  Pulse 88  Temp(Src) 98.2 F (36.8 C) (Oral)  Wt 164 lb (74.39 kg)  SpO2 98% General appearance: alert, cooperative, appears stated age and no distress Neck: no adenopathy, supple, symmetrical, trachea midline and thyroid not enlarged, symmetric, no tenderness/mass/nodules Lungs: clear to auscultation bilaterally Heart: regular rate and rhythm, S1, S2 normal, no murmur, click, rub or gallop Extremities: extremities normal, atraumatic, no cyanosis or edema        Assessment & Plan:  1. Depression con't meds'rto 1 month or sooner prn Cont counseling  2. Anxiety con't meds and counseling - ALPRAZolam (XANAX) 0.5 MG tablet; Take 1 tablet (0.5 mg total) by mouth 3 (three) times daily as needed for anxiety.  Dispense: 60 tablet; Refill: 0

## 2013-06-25 NOTE — Patient Instructions (Signed)

## 2013-06-25 NOTE — Progress Notes (Signed)
Pre visit review using our clinic review tool, if applicable. No additional management support is needed unless otherwise documented below in the visit note. 

## 2013-06-30 ENCOUNTER — Telehealth: Payer: Self-pay | Admitting: Internal Medicine

## 2013-06-30 NOTE — Telephone Encounter (Signed)
New problem      Pt called in reported feeling Dizzy, Light headed and Weak for the pass 3 to 4 day and would like to be see soon by Dr Johney FrameAllred or Nehemiah SettleBrooke PA.   Please give her a call.  Offered 4/21 2 pm with Nehemiah SettleBrooke but could not make it.

## 2013-06-30 NOTE — Telephone Encounter (Signed)
Will have Kristen call her tomorrow and set up to do transmission from home

## 2013-07-01 NOTE — Telephone Encounter (Signed)
LMOM for return call to help set up monitor/kwm

## 2013-07-06 NOTE — Telephone Encounter (Signed)
[facility] visit notes from March 2015 until present. All notes faxed to [employer] at [phone]. Confirmation received. JG//CMA

## 2013-07-07 NOTE — Telephone Encounter (Signed)
Spoke w/pt and pt not home. Pt to return call once home.

## 2013-07-07 NOTE — Telephone Encounter (Signed)
Spoke w/pt and instructed on how to send carelink transmission. Transmission received. No episodes recorded. Pt to follow up in June.

## 2013-07-09 ENCOUNTER — Ambulatory Visit: Payer: 59 | Admitting: Licensed Clinical Social Worker

## 2013-07-20 ENCOUNTER — Telehealth: Payer: Self-pay | Admitting: *Deleted

## 2013-07-20 DIAGNOSIS — Z0279 Encounter for issue of other medical certificate: Secondary | ICD-10-CM

## 2013-07-20 NOTE — Telephone Encounter (Signed)
Patient dropped off disability paperwork on Friday, Jul 17, 2013 at our office. Billing sheet attached, form filled out at much as possible and placed in folder for Dr. Laury AxonLowne. JG//CMA

## 2013-08-04 ENCOUNTER — Ambulatory Visit (INDEPENDENT_AMBULATORY_CARE_PROVIDER_SITE_OTHER): Payer: 59 | Admitting: Licensed Clinical Social Worker

## 2013-08-04 DIAGNOSIS — F331 Major depressive disorder, recurrent, moderate: Secondary | ICD-10-CM

## 2013-08-04 NOTE — Telephone Encounter (Signed)
Disability forms picked up by patient today. JG//CMA

## 2013-08-11 ENCOUNTER — Ambulatory Visit (INDEPENDENT_AMBULATORY_CARE_PROVIDER_SITE_OTHER): Payer: 59 | Admitting: Licensed Clinical Social Worker

## 2013-08-11 ENCOUNTER — Encounter: Payer: Self-pay | Admitting: Family Medicine

## 2013-08-11 DIAGNOSIS — F331 Major depressive disorder, recurrent, moderate: Secondary | ICD-10-CM

## 2013-08-14 ENCOUNTER — Telehealth: Payer: Self-pay | Admitting: *Deleted

## 2013-08-14 NOTE — Telephone Encounter (Signed)
Received behavioral health assessment via interdepartment mail from [employer]. Dr. Laury Axon stated that this paperwork needs to be filled out by psych/counseling. Paperwork send via interdepartment mail back to [employer]. JG//CMA

## 2013-08-21 ENCOUNTER — Other Ambulatory Visit: Payer: Self-pay | Admitting: Family Medicine

## 2013-08-21 DIAGNOSIS — F419 Anxiety disorder, unspecified: Secondary | ICD-10-CM

## 2013-08-24 ENCOUNTER — Encounter: Payer: Self-pay | Admitting: *Deleted

## 2013-08-24 MED ORDER — ALPRAZOLAM 0.5 MG PO TABS: 0.5000 mg | ORAL_TABLET | Freq: Three times a day (TID) | ORAL | Status: DC | PRN

## 2013-08-24 NOTE — Telephone Encounter (Signed)
Last seen and filled 06/25/13 #60. Please advise     KP 

## 2013-08-27 ENCOUNTER — Ambulatory Visit (INDEPENDENT_AMBULATORY_CARE_PROVIDER_SITE_OTHER): Payer: 59 | Admitting: Licensed Clinical Social Worker

## 2013-08-27 DIAGNOSIS — F331 Major depressive disorder, recurrent, moderate: Secondary | ICD-10-CM

## 2013-08-30 ENCOUNTER — Emergency Department (HOSPITAL_COMMUNITY): Payer: 59

## 2013-08-30 ENCOUNTER — Emergency Department (HOSPITAL_COMMUNITY)
Admission: EM | Admit: 2013-08-30 | Discharge: 2013-08-30 | Disposition: A | Discharge status: Home / Self Care | Payer: 59 | Attending: Emergency Medicine | Admitting: Emergency Medicine

## 2013-08-30 ENCOUNTER — Encounter (HOSPITAL_COMMUNITY): Payer: Self-pay | Admitting: Emergency Medicine

## 2013-08-30 DIAGNOSIS — F3289 Other specified depressive episodes: Secondary | ICD-10-CM | POA: Insufficient documentation

## 2013-08-30 DIAGNOSIS — Z8679 Personal history of other diseases of the circulatory system: Secondary | ICD-10-CM | POA: Insufficient documentation

## 2013-08-30 DIAGNOSIS — Z8719 Personal history of other diseases of the digestive system: Secondary | ICD-10-CM | POA: Insufficient documentation

## 2013-08-30 DIAGNOSIS — Z79899 Other long term (current) drug therapy: Secondary | ICD-10-CM | POA: Insufficient documentation

## 2013-08-30 DIAGNOSIS — IMO0002 Reserved for concepts with insufficient information to code with codable children: Secondary | ICD-10-CM | POA: Insufficient documentation

## 2013-08-30 DIAGNOSIS — F410 Panic disorder [episodic paroxysmal anxiety] without agoraphobia: Secondary | ICD-10-CM | POA: Insufficient documentation

## 2013-08-30 DIAGNOSIS — Z3202 Encounter for pregnancy test, result negative: Secondary | ICD-10-CM | POA: Insufficient documentation

## 2013-08-30 DIAGNOSIS — Z8619 Personal history of other infectious and parasitic diseases: Secondary | ICD-10-CM | POA: Insufficient documentation

## 2013-08-30 DIAGNOSIS — F10920 Alcohol use, unspecified with intoxication, uncomplicated: Secondary | ICD-10-CM

## 2013-08-30 DIAGNOSIS — Z8742 Personal history of other diseases of the female genital tract: Secondary | ICD-10-CM | POA: Insufficient documentation

## 2013-08-30 DIAGNOSIS — F329 Major depressive disorder, single episode, unspecified: Secondary | ICD-10-CM | POA: Insufficient documentation

## 2013-08-30 DIAGNOSIS — F1010 Alcohol abuse, uncomplicated: Secondary | ICD-10-CM

## 2013-08-30 DIAGNOSIS — F911 Conduct disorder, childhood-onset type: Secondary | ICD-10-CM | POA: Insufficient documentation

## 2013-08-30 DIAGNOSIS — Z950 Presence of cardiac pacemaker: Secondary | ICD-10-CM | POA: Insufficient documentation

## 2013-08-30 DIAGNOSIS — Z87891 Personal history of nicotine dependence: Secondary | ICD-10-CM | POA: Insufficient documentation

## 2013-08-30 HISTORY — DX: Panic disorder (episodic paroxysmal anxiety): F41.0

## 2013-08-30 HISTORY — DX: Anxiety disorder, unspecified: F41.9

## 2013-08-30 HISTORY — DX: Depression, unspecified: F32.A

## 2013-08-30 HISTORY — DX: Alcohol abuse, uncomplicated: F10.10

## 2013-08-30 HISTORY — DX: Major depressive disorder, single episode, unspecified: F32.9

## 2013-08-30 LAB — CBC WITH DIFFERENTIAL/PLATELET
BASOS ABS: 0 10*3/uL (ref 0.0–0.1)
Basophils Relative: 1 % (ref 0–1)
EOS ABS: 0.1 10*3/uL (ref 0.0–0.7)
Eosinophils Relative: 1 % (ref 0–5)
HCT: 38.5 % (ref 36.0–46.0)
Hemoglobin: 13 g/dL (ref 12.0–15.0)
LYMPHS ABS: 4.1 10*3/uL — AB (ref 0.7–4.0)
Lymphocytes Relative: 48 % — ABNORMAL HIGH (ref 12–46)
MCH: 30.2 pg (ref 26.0–34.0)
MCHC: 33.8 g/dL (ref 30.0–36.0)
MCV: 89.3 fL (ref 78.0–100.0)
Monocytes Absolute: 0.6 10*3/uL (ref 0.1–1.0)
Monocytes Relative: 7 % (ref 3–12)
NEUTROS PCT: 43 % (ref 43–77)
Neutro Abs: 3.5 10*3/uL (ref 1.7–7.7)
Platelets: 262 10*3/uL (ref 150–400)
RBC: 4.31 MIL/uL (ref 3.87–5.11)
RDW: 13.7 % (ref 11.5–15.5)
WBC: 8.3 10*3/uL (ref 4.0–10.5)

## 2013-08-30 LAB — RAPID URINE DRUG SCREEN, HOSP PERFORMED
Amphetamines: NOT DETECTED
Barbiturates: NOT DETECTED
Benzodiazepines: NOT DETECTED
Cocaine: NOT DETECTED
Opiates: NOT DETECTED
Tetrahydrocannabinol: NOT DETECTED

## 2013-08-30 LAB — COMPREHENSIVE METABOLIC PANEL
ALK PHOS: 47 U/L (ref 39–117)
ALT: 18 U/L (ref 0–35)
AST: 25 U/L (ref 0–37)
Albumin: 4.4 g/dL (ref 3.5–5.2)
BUN: 12 mg/dL (ref 6–23)
CO2: 18 mEq/L — ABNORMAL LOW (ref 19–32)
Calcium: 8.7 mg/dL (ref 8.4–10.5)
Chloride: 111 mEq/L (ref 96–112)
Creatinine, Ser: 0.7 mg/dL (ref 0.50–1.10)
GFR calc Af Amer: >90 mL/min (ref >90)
GFR calc non Af Amer: >90 mL/min (ref >90)
GLUCOSE: 111 mg/dL — AB (ref 70–99)
POTASSIUM: 4.2 meq/L (ref 3.7–5.3)
SODIUM: 147 meq/L (ref 137–147)
TOTAL PROTEIN: 7.7 g/dL (ref 6.0–8.3)
Total Bilirubin: <0.2 mg/dL — ABNORMAL LOW (ref 0.3–1.2)

## 2013-08-30 LAB — SALICYLATE LEVEL

## 2013-08-30 LAB — ETHANOL: ALCOHOL ETHYL (B): 383 mg/dL — AB (ref 0–11)

## 2013-08-30 LAB — PREGNANCY, URINE: Preg Test, Ur: NEGATIVE

## 2013-08-30 LAB — ACETAMINOPHEN LEVEL: Acetaminophen (Tylenol), Serum: <15 ug/mL (ref 10–30)

## 2013-08-30 MED ORDER — SODIUM CHLORIDE 0.9 % IV SOLN
INTRAVENOUS | Status: DC
  Administered 2013-08-30: 03:00:00 via INTRAVENOUS

## 2013-08-30 NOTE — Discharge Instructions (Signed)
°Emergency Department Resource Guide °1) Find a Doctor and Pay Out of Pocket °Although you won't have to find out who is covered by your insurance plan, it is a good idea to ask around and get recommendations. You will then need to call the office and see if the doctor you have chosen will accept you as a new patient and what types of options they offer for patients who are self-pay. Some doctors offer discounts or will set up payment plans for their patients who do not have insurance, but you will need to ask so you aren't surprised when you get to your appointment. ° °2) Contact Your Local Health Department °Not all health departments have doctors that can see patients for sick visits, but many do, so it is worth a call to see if yours does. If you don't know where your local health department is, you can check in your phone book. The CDC also has a tool to help you locate your state's health department, and many state websites also have listings of all of their local health departments. ° °3) Find a Walk-in Clinic °If your illness is not likely to be very severe or complicated, you may want to try a walk in clinic. These are popping up all over the country in pharmacies, drugstores, and shopping centers. They're usually staffed by nurse practitioners or physician assistants that have been trained to treat common illnesses and complaints. They're usually fairly quick and inexpensive. However, if you have serious medical issues or chronic medical problems, these are probably not your best option. ° °No Primary Care Doctor: °- Call [facility] Connect at  832-8000 - they can help you locate a primary care doctor that  accepts your insurance, provides certain services, etc. °- Physician Referral Service- 1-800-533-3463 ° °Chronic Pain Problems: °Organization         Address  Phone   Notes  °[facility]  (336) 297-2271 Patients need to be referred by their primary care doctor.  ° °Medication  Assistance: °Organization         Address  Phone   Notes  °[place] [facility] [address] °[place], [place] [address] (336) 641-8030 --Must be a resident of [place] °-- Must have NO insurance coverage whatsoever (no Medicaid/ Medicare, etc.) °-- The pt. MUST have a primary care doctor that directs their care regularly and follows them in the community °  °[facility]  (866) 331-1348   °[employer]  (888) 892-1162   ° °Agencies that provide inexpensive medical care: °Organization         Address  Phone   Notes  °[facility]  (336) 832-8035   °[facility]    (336) 832-7272   °[facility] [address] °[place], [place] [address] (336) 832-4777   °[facility] [address], °[place] (336) 271-4999   °[facility]    (336) 373-0678   °[facility]    (336) 272-1050   °[facility] ° [address], [place] Phone:  (336) 832-4444, Fax:  (336) 832-4440 Hours of Operation:  9 am - 6 pm, M-F.  Also accepts Medicaid/Medicare and self-pay.  °[facility] ° [address], [place] Phone: (336) 832-3150, Fax: (336) [phone]. Hours of Operation:  8:30 am - 5:30 pm, M-F.  Also accepts Medicaid and self-pay.  °[facility] [place] [address], [place] Phone: (336) 878-6027   °[facility] [address], [place], [place] (336)723-1848, Ext. 123 Mondays & Thursdays: 7-9 AM.  First 15 patients are seen on a first come, first serve basis. °  ° °Medicaid-accepting [place] Providers: ° °Organization         Address  Phone   Notes  °[facility] [address], [place] (336) 641-2100 Also accepts self-pay patients.  °[facility] [address], [place] ° (336) 856-9996   °[facility] [address], [place]  (336) 288-8857   °[facility] [address] [place] [address], [place] (336) 299-7000   °Veita Bland [address], [place]  ° (336) 373-1557 Only accepts [place] Access Medicaid patients after they have their name applied to their card.  ° °Self-Pay (no insurance) in [place]: ° °Organization         Address  Phone   Notes  °Sickle Cell Patients, [facility] [address], [place] (336) 832-1970   °[facility] [address], [place] (336) 832-4400   °[facility] ° [address], [place] (336) 992-4800   °[facility]/Dr. Osei-Bonsu ° [address] [place] [address], [place] or [address], [place] (336) 841-8500 Phone number for both [place] and [place] locations is the same.  °[facility] [address], [place] (336) 299-0000   °[facility] [address] [place] [address], [place] or [address] (336) 852-7530 °(336) 878-2260   °[facility] [address], [place] (336) 350-1642, phone; (336) 294-5005, fax Sees patients 1st and 3rd Saturday of every month.  Must not qualify for public or private insurance (i.e. Medicaid, Medicare, [place] Health Choice, Veterans' Benefits) • Household income should be no more than 200% of the poverty level •The clinic cannot treat you if you are pregnant or think you are pregnant • Sexually transmitted diseases are not treated at the clinic.  ° ° °Dental Care: °Organization         Address  Phone  Notes  °[place] [facility] [address], [place] (336) 641-6152 Accepts children up to age 21 who are enrolled in Medicaid or [place] Health Choice; pregnant women with a Medicaid card; and children who have applied for Medicaid or [place] Health Choice, but were declined, whose parents can pay a reduced fee at time of service.  °[place]  [facility] [place]  [address], [place] (336) 641-7733 Accepts children up to age 21 who are enrolled in Medicaid or [place] Health Choice; pregnant women with a Medicaid card; and children who have applied for Medicaid or [place] Health Choice, but were declined, whose parents can pay a reduced fee at time of service.  °[facility] ° [address], [place] (336) 641-4533 Patients are seen by appointment only. Walk-ins are not accepted. [facility] will see patients 18 years of age and older. °Monday - Tuesday (8am-5pm) °Most Wednesdays (8:30-5pm) °$30 per visit, cash only  °[facility] ° [address], [place] (336) 641-4533 Patients are seen by appointment only. Walk-ins are not accepted. [facility] will see patients 18 years of age and older. °One   Wednesday Evening (Monthly: Volunteer Based).  $30 per visit, cash only  °[facility]  (919) 537-3737 for adults; Children under age 4, call Graduate Pediatric Dentistry at (919) [phone]. Children aged 4-14, please call (919) [phone] to request a pediatric application. ° Dental services are provided in all areas of dental care including fillings, crowns and bridges, complete and partial dentures, implants, gum treatment, root canals, and extractions. Preventive care is also provided. Treatment is provided to both adults and children. °Patients are selected via a lottery and there is often a waiting list. °  °[facility] [address], °[place] ° (336) 763-8833 [email or web address] °  °[facility] [address], [place], [place] (336)723-1848, Ext. 123 Second and Fourth Thursday of each month, opens at 6:30 AM; Clinic ends at 9 AM.  Patients are seen on a first-come first-served basis, and a limited number are seen during each clinic.  ° °[facility] ° [address], [place], [place] (336) 723-7904    Eligibility Requirements °You must have lived in [place], [place], or [place] counties for at least the last three months. °  You cannot be eligible for state or federal sponsored healthcare insurance, including Veterans Administration, Medicaid, or Medicare. °  You generally cannot be eligible for healthcare insurance through your employer.  °  How to apply: °Eligibility screenings are held every Tuesday and Wednesday afternoon from 1:00 pm until 4:00 pm. You do not need an appointment for the interview!  °[facility] [address], [place], [place] 336-631-2330   °[facility]  336-342-8273   °[facility]  336-703-3100   °[facility]  336-570-6415   ° °Behavioral Health Resources in the Community: °Intensive Outpatient Programs °Organization         Address  Phone  Notes  °[facility] [address], [place], [place] 336-878-6098   °[facility] [address], [place], [place] 336-832-9800   °[facility] [address], [place], [place] ° 336-882-2125   °[facility] [address],  °[place], [place] 1-800-853-5163 or 336-641-4981   °Substance Abuse Resources °Organization         Address  Phone  Notes  °[facility]  336-882-2125   °[facility]  336-784-9470   °[facility]  336-285-9073   °[facility]  336-845-3988   °Residential & Outpatient Substance Abuse Program  1-800-659-3381   °Psychological Services °Organization         Address  Phone  Notes  °[facility]  336- 832-9600   °[facility]  336- 378-7881   °[facility] [address], [place] 1-800-853-5163 or 336-641-4981   ° °Mobile Crisis Teams °Organization         Address  Phone  Notes  °[facility], Mobile Crisis Care Unit  1-877-626-1772   °Assertive °[facility] ° [address]  [place], [place] 336-834-9664   °Sharon DeEsch [address] °[place] 336-554-5454   ° °Self-Help/Support Groups °Organization         Address  Phone             Notes  °[facility]  - variety of support groups  336- 373-1402 Call for more information  °Narcotics Anonymous (NA), [facility] [address], °[place]  2 meetings at this location  ° °  Residential Treatment Programs Organization         Address  Phone  Notes  [facility] [address],    [place]  [phone]   [facility]  [address], [place] [phone], [place], [place] [phone]   [facility] [address], [place] [phone] Admissions: 8am-3pm M-F  [facility] [address]    [place], [place] [phone]   [facility] [address], [place], [place] [phone]   [facility] [address]  [place], [place] [phone]   [facility] - Intensive Outpatient [address], [place], [place] [phone]   [facility] ([facility] [address]  [place], [place] [phone])[phone] or (740) 088-2277([phone]   [facility] ([facility] [address] [place], [place] [phone] Accepts Medicaid  [facility] [address]  [place] [phone] Substance Abuse/Addiction Treatment   [facility] Organization         Address  Phone  Notes  [facility]  (773)142-4056(888) 469-464-6227   Angie FavaJulie Brannon, PhD [address], [address] [place], [place]   [phone](336) (703)[phone] or (509)[phone](336) ([phone]) [phone]   [facility]   [address] [place], [place] [phone](336) (323) [phone]   [facility] [address], [place], [place] [phone](336) (562)[phone] Insurance/Medicaid/sponsorship through [facility] [address]                                    [place], [place] [phone](336) (562)[phone] Therapy/tele-psych/case    [facility] [address].   [place], [place] ([phone](336) ([phone])[phone]    Dr. Lolly MustacheArfeen  ([phone](336) [phone]   [facility]  [employer] [facility] 1) [address], [place] 2) [address], [place] 3)  [address], [place] [phone](336) [phone](336) [phone]  [phone](336) [phone]   [facility] Child Abuse Hotline [phone](336) 618-724-4294 or [phone](336) [phone] (After Hours)       Take your usual prescriptions as previously directed.  Call your regular medical doctor tomorrow to schedule a follow up appointment within the next 2 days. Call the resources given to you today if you would like help to stop drinking alcohol.  Return to the Emergency Department immediately sooner if worsening.

## 2013-08-30 NOTE — ED Notes (Addendum)
Pt is slowly regaining her capacity for reason.  Left wrist restraint taken off.  Pt is tearful however is being compliant at this time by not unfastening her other wrist.  Pt is denying SI/HI.
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
## 2013-08-30 NOTE — Consult Note (Signed)
[facility] Assessment no SI/HI. + Alcoholism with tolerance.[facility] and continued FU with outside counselor Berniece Andreas([name]).Though she claims to be taking xanax there is none in her UDS.She will not be allowed this cross tolerant/addictive medication if she attends the CD IOP.program.Family is pessimistic she will FU and attend CD IOP.

## 2013-08-30 NOTE — ED Notes (Signed)
Bed: WA15 Expected date:  Expected time:  Means of arrival:  Comments: EMS ETOH 
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
## 2013-08-30 NOTE — ED Notes (Signed)
Pt is out of restraints and using bedside commode.
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
## 2013-08-30 NOTE — ED Notes (Signed)
Pt is combative, hitting and kicking at staff.  Security and [employer] are at bedside.  Soft restraints applied as pt is a risk to self and others.  Pt is physically and verbally aggressive.

## 2013-09-06 ENCOUNTER — Other Ambulatory Visit: Payer: Self-pay | Admitting: Family Medicine

## 2013-09-07 NOTE — Telephone Encounter (Signed)
Patient is wanting the RX increased from 10mg  to 20mg .  Pt increased the 10mg  to 15mg  on their own, and is wanting the rx written for 20mg 

## 2013-09-07 NOTE — Telephone Encounter (Signed)
Please advise on medication increase request.      KP

## 2013-09-10 ENCOUNTER — Ambulatory Visit: Payer: 59 | Admitting: Licensed Clinical Social Worker

## 2013-09-29 ENCOUNTER — Other Ambulatory Visit: Payer: Self-pay | Admitting: Family Medicine

## 2013-09-29 ENCOUNTER — Telehealth: Payer: Self-pay | Admitting: Family Medicine

## 2013-09-29 ENCOUNTER — Ambulatory Visit: Payer: Self-pay | Admitting: Family Medicine

## 2013-09-29 DIAGNOSIS — F419 Anxiety disorder, unspecified: Secondary | ICD-10-CM

## 2013-09-29 MED ORDER — ALPRAZOLAM 0.5 MG PO TABS: 0.5000 mg | ORAL_TABLET | Freq: Three times a day (TID) | ORAL | Status: DC | PRN

## 2013-09-29 MED ORDER — ESCITALOPRAM OXALATE 20 MG PO TABS: 20.0000 mg | ORAL_TABLET | Freq: Every day | ORAL | Status: DC

## 2013-09-29 NOTE — Telephone Encounter (Signed)
Informed the pt of note below.  Pt agreed and stated that she has an rescheduled appt for Tue (10-06-13).  New rx was sent to the pharmacy by e-script.//AB/CMA

## 2013-09-29 NOTE — Telephone Encounter (Signed)
Needs CSC 

## 2013-09-29 NOTE — Telephone Encounter (Signed)
Med filled and faxed.  

## 2013-09-29 NOTE — Telephone Encounter (Signed)
Caller name: Natalia LeatherwoodKatherine  Relation to pt: Call back number:[phone] Pharmacy: [facility] [address]  Reason for call:  Pt had a med follow up for 7/21, but it was canceled due to provider being sick.  Pt is out of the rx escitalopram (LEXAPRO) 10 MG tablet and the pharmacy won't refill.  Pt had spoke with Dr. Laury AxonLowne about increasing it to 20mg  instead of 10.  Please advise since the pt is out of this medication.

## 2013-09-29 NOTE — Telephone Encounter (Signed)
If she feels needs a increased dose on Lexapro, ok to change to Lexapro 20 mg one by mouth  , #30, no refills. Please re-schedule a visit with PCP

## 2013-09-29 NOTE — Telephone Encounter (Signed)
Refill Requested:  ALPRAZolam (XANAX) 0.5 MG tablet-Take 1 tablet (0.5 mg total) by mouth 3 (three) times daily as needed for anxiety  Last Filled:  08/24/13 Amt Filled:  60 tablets, 0 refills Last OV:  06/25/13 No contract on file UDS- not noted for our office; Urine rapid drug screen (hosp performed)- 08/30/13  Please advise.

## 2013-09-29 NOTE — Telephone Encounter (Signed)
A user error has taken place.

## 2013-09-30 ENCOUNTER — Encounter: Payer: Self-pay | Admitting: *Deleted

## 2013-10-06 ENCOUNTER — Ambulatory Visit: Payer: Self-pay | Admitting: Family Medicine

## 2013-10-15 ENCOUNTER — Ambulatory Visit (INDEPENDENT_AMBULATORY_CARE_PROVIDER_SITE_OTHER): Payer: 59 | Admitting: Family Medicine

## 2013-10-15 ENCOUNTER — Encounter: Payer: Self-pay | Admitting: Family Medicine

## 2013-10-15 DIAGNOSIS — F411 Generalized anxiety disorder: Secondary | ICD-10-CM

## 2013-10-15 DIAGNOSIS — G4700 Insomnia, unspecified: Secondary | ICD-10-CM

## 2013-10-15 MED ORDER — SUVOREXANT 10 MG PO TABS: 1.0000 | ORAL_TABLET | Freq: Every evening | ORAL | Status: DC | PRN

## 2013-10-15 MED ORDER — LORCASERIN HCL 10 MG PO TABS: ORAL_TABLET | ORAL | Status: DC

## 2013-10-15 NOTE — Progress Notes (Signed)
Subjective:    Patient ID: [name] NiemannKatherine H Noone, female    DOB: December 23, 1975, 38 y.o.   MRN: [id number]  HPI Pt here to f/u anxiety.  She is doing well with the higher dose of lexapro. She is struggling with weight gain since quitting smoking and drinking-- she is also struggling to sleep.  She states she can not turn her brain off.  The Remus Lofflerambien had to be taken every night so that is not the most appropriate drug.     Review of Systems As above    Past Medical History  Diagnosis Date  . Abnormal Pap smear 2004    colpo  . H/O candidiasis   . H/O varicella 2005  . Pre-eclampsia     H/O  . Pelvic pain in female 2007  . Candida vaginitis 05/2006  . IBS (irritable bowel syndrome)   . 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 Depression   . Anxiety   . Panic attack   . Alcohol abuse    History   Social History  . Marital Status: Married    Spouse Name: N/A    Number of Children: N/A  . Years of Education: N/A   Occupational History  . Not on file.   Social History Main Topics  . Smoking status: Former Smoker -- 0.50 packs/day    Types: Cigarettes  . Smokeless tobacco: Never Used  . Alcohol Use: Yes  . Drug Use: No  . Sexual Activity: Yes    Partners: Male    Birth Control/ Protection: Pill     Comment: loestrin fe   Other Topics Concern  . Not on file   Social History Narrative   [place] and [place] age 188.   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 ALPRAZolam (XANAX) 0.5 MG tablet Take 1 tablet (0.5 mg total) by mouth 3 (three) times daily as needed for anxiety.  60 tablet  0  . Aspirin-Salicylamide-Caffeine (BC HEADACHE POWDER PO) Take 1 packet by mouth daily as needed (for headaches).      . escitalopram (LEXAPRO) 20 MG tablet Take 1 tablet (20 mg total) by mouth  daily.  30 tablet  0  . JUNEL FE 1/20 1-20 MG-MCG tablet Take 1 tablet by mouth daily.      . Lorcaserin HCl (BELVIQ) 10 MG TABS 1 po bid  30 tablet  0  . Lorcaserin HCl (BELVIQ) 10 MG TABS 1 po bid  60 tablet  2  . Suvorexant (BELSOMRA) 10 MG TABS Take 1 tablet by mouth at bedtime as needed.  10 tablet  0   No current facility-administered medications for this visit.    Objective:   Physical Exam  BP 108/72  Pulse 85  Temp(Src) 97.8 F (36.6 C) (Oral)  Wt 171 lb (77.565 kg)  SpO2 98% General appearance: alert, cooperative, appears stated age and no distress Neck: no adenopathy, supple, symmetrical, trachea midline and thyroid not enlarged, symmetric, no tenderness/mass/nodules Lungs: clear to auscultation bilaterally Heart: regular rate and rhythm, S1, S2 normal, no murmur, click, rub or gallop Extremities: extremities normal, atraumatic, no cyanosis or edema Lymph nodes: Cervical, supraclavicular, and axillary nodes normal.       Assessment & Plan:  1. Severe obesity (BMI >= 40) Discussed diet and exercise -  Lorcaserin HCl (BELVIQ) 10 MG TABS; 1 po bid  Dispense: 30 tablet; Refill:   2 Generalized anxiety disorder con't meds rto 3 months  3. Insomnia ambien tried in the past --  Pt needs something for everyday - Suvorexant (BELSOMRA) 10 MG TABS; Take 1 tablet by mouth at bedtime as needed.  Dispense: 10 tablet; Refill: 0----- can take 2 if 1 does not work

## 2013-10-15 NOTE — Progress Notes (Signed)
Pre visit review using our clinic review tool, if applicable. No additional management support is needed unless otherwise documented below in the visit note. 

## 2013-10-15 NOTE — Patient Instructions (Signed)

## 2013-10-28 ENCOUNTER — Emergency Department (HOSPITAL_BASED_OUTPATIENT_CLINIC_OR_DEPARTMENT_OTHER)
Admission: EM | Admit: 2013-10-28 | Discharge: 2013-10-28 | Disposition: A | Discharge status: Home / Self Care | Payer: 59 | Attending: Emergency Medicine | Admitting: Emergency Medicine

## 2013-10-28 ENCOUNTER — Encounter (HOSPITAL_BASED_OUTPATIENT_CLINIC_OR_DEPARTMENT_OTHER): Payer: Self-pay | Admitting: Emergency Medicine

## 2013-10-28 DIAGNOSIS — R42 Dizziness and giddiness: Secondary | ICD-10-CM

## 2013-10-28 DIAGNOSIS — Z79899 Other long term (current) drug therapy: Secondary | ICD-10-CM | POA: Diagnosis not present

## 2013-10-28 DIAGNOSIS — Z8742 Personal history of other diseases of the female genital tract: Secondary | ICD-10-CM | POA: Insufficient documentation

## 2013-10-28 DIAGNOSIS — R197 Diarrhea, unspecified: Secondary | ICD-10-CM | POA: Diagnosis not present

## 2013-10-28 DIAGNOSIS — F410 Panic disorder [episodic paroxysmal anxiety] without agoraphobia: Secondary | ICD-10-CM | POA: Insufficient documentation

## 2013-10-28 DIAGNOSIS — Z87891 Personal history of nicotine dependence: Secondary | ICD-10-CM | POA: Diagnosis not present

## 2013-10-28 DIAGNOSIS — R630 Anorexia: Secondary | ICD-10-CM | POA: Insufficient documentation

## 2013-10-28 DIAGNOSIS — F3289 Other specified depressive episodes: Secondary | ICD-10-CM | POA: Diagnosis not present

## 2013-10-28 DIAGNOSIS — R1110 Vomiting, unspecified: Secondary | ICD-10-CM | POA: Insufficient documentation

## 2013-10-28 DIAGNOSIS — Z8719 Personal history of other diseases of the digestive system: Secondary | ICD-10-CM | POA: Diagnosis not present

## 2013-10-28 DIAGNOSIS — Z8619 Personal history of other infectious and parasitic diseases: Secondary | ICD-10-CM | POA: Insufficient documentation

## 2013-10-28 DIAGNOSIS — F329 Major depressive disorder, single episode, unspecified: Secondary | ICD-10-CM | POA: Insufficient documentation

## 2013-10-28 DIAGNOSIS — Z8679 Personal history of other diseases of the circulatory system: Secondary | ICD-10-CM | POA: Insufficient documentation

## 2013-10-28 LAB — ETHANOL: Alcohol, Ethyl (B): <11 mg/dL (ref 0–11)

## 2013-10-28 LAB — COMPREHENSIVE METABOLIC PANEL
ALT: 18 U/L (ref 0–35)
AST: 25 U/L (ref 0–37)
Albumin: 4.2 g/dL (ref 3.5–5.2)
Alkaline Phosphatase: 44 U/L (ref 39–117)
Anion gap: 14 (ref 5–15)
BILIRUBIN TOTAL: 0.5 mg/dL (ref 0.3–1.2)
BUN: 12 mg/dL (ref 6–23)
CALCIUM: 9.6 mg/dL (ref 8.4–10.5)
CHLORIDE: 105 meq/L (ref 96–112)
CO2: 24 meq/L (ref 19–32)
Creatinine, Ser: 0.8 mg/dL (ref 0.50–1.10)
GLUCOSE: 100 mg/dL — AB (ref 70–99)
Potassium: 4 mEq/L (ref 3.7–5.3)
Sodium: 143 mEq/L (ref 137–147)
Total Protein: 7.5 g/dL (ref 6.0–8.3)

## 2013-10-28 LAB — CBC
HCT: 41 % (ref 36.0–46.0)
Hemoglobin: 13.7 g/dL (ref 12.0–15.0)
MCH: 30.2 pg (ref 26.0–34.0)
MCHC: 33.4 g/dL (ref 30.0–36.0)
MCV: 90.5 fL (ref 78.0–100.0)
PLATELETS: 265 10*3/uL (ref 150–400)
RBC: 4.53 MIL/uL (ref 3.87–5.11)
RDW: 13.3 % (ref 11.5–15.5)
WBC: 8.5 10*3/uL (ref 4.0–10.5)

## 2013-10-28 LAB — LIPASE, BLOOD: Lipase: 29 U/L (ref 11–59)

## 2013-10-28 MED ORDER — PROMETHAZINE HCL 12.5 MG PO TABS: 12.5000 mg | ORAL_TABLET | Freq: Four times a day (QID) | ORAL | Status: DC | PRN

## 2013-10-28 MED ORDER — SODIUM CHLORIDE 0.9 % IV BOLUS (SEPSIS)
1000.0000 mL | Freq: Once | INTRAVENOUS | Status: AC
  Administered 2013-10-28: 1000 mL via INTRAVENOUS

## 2013-10-28 NOTE — Discharge Instructions (Signed)
Your labs were all within the limits of normal today. Please keep up your fluid intake at home and get plenty of rest.   Take the phenergan for nausea as you need it.  Dizziness Dizziness is a common problem. It is a feeling of unsteadiness or light-headedness. You may feel like you are about to faint. Dizziness can lead to injury if you stumble or fall. A person of any age group can suffer from dizziness, but dizziness is more common in older adults. CAUSES  Dizziness can be caused by many different things, including:  Middle ear problems.  Standing for too long.  Infections.  An allergic reaction.  Aging.  An emotional response to something, such as the sight of blood.  Side effects of medicines.  Tiredness.  Problems with circulation or blood pressure.  Excessive use of alcohol or medicines, or illegal drug use.  Breathing too fast (hyperventilation).  An irregular heart rhythm (arrhythmia).  A low red blood cell count (anemia).  Pregnancy.  Vomiting, diarrhea, fever, or other illnesses that cause body fluid loss (dehydration).  Diseases or conditions such as Parkinson's disease, high blood pressure (hypertension), diabetes, and thyroid problems.  Exposure to extreme heat. DIAGNOSIS  Your health care provider will ask about your symptoms, perform a physical exam, and perform an electrocardiogram (ECG) to record the electrical activity of your heart. Your health care provider may also perform other heart or blood tests to determine the cause of your dizziness. These may include:  Transthoracic echocardiogram (TTE). During echocardiography, sound waves are used to evaluate how blood flows through your heart.  Transesophageal echocardiogram (TEE).  Cardiac monitoring. This allows your health care provider to monitor your heart rate and rhythm in real time.  Holter monitor. This is a portable device that records your heartbeat and can help diagnose heart arrhythmias.  It allows your health care provider to track your heart activity for several days if needed.  Stress tests by exercise or by giving medicine that makes the heart beat faster. TREATMENT  Treatment of dizziness depends on the cause of your symptoms and can vary greatly. HOME CARE INSTRUCTIONS   Drink enough fluids to keep your urine clear or pale yellow. This is especially important in very hot weather. In older adults, it is also important in cold weather.  Take your medicine exactly as directed if your dizziness is caused by medicines. When taking blood pressure medicines, it is especially important to get up slowly.  Rise slowly from chairs and steady yourself until you feel okay.  In the morning, first sit up on the side of the bed. When you feel okay, stand slowly while holding onto something until you know your balance is fine.  Move your legs often if you need to stand in one place for a long time. Tighten and relax your muscles in your legs while standing.  Have someone stay with you for 1-2 days if dizziness continues to be a problem. Do this until you feel you are well enough to stay alone. Have the person call your health care provider if he or she notices changes in you that are concerning.  Do not drive or use heavy machinery if you feel dizzy.  Do not drink alcohol. SEEK IMMEDIATE MEDICAL CARE IF:   Your dizziness or light-headedness gets worse.  You feel nauseous or vomit.  You have problems talking, walking, or using your arms, hands, or legs.  You feel weak.  You are not thinking clearly or  you have trouble forming sentences. It may take a friend or family member to notice this.  You have chest pain, abdominal pain, shortness of breath, or sweating.  Your vision changes.  You notice any bleeding.  You have side effects from medicine that seems to be getting worse rather than better. MAKE SURE YOU:   Understand these instructions.  Will watch your  condition.  Will get help right away if you are not doing well or get worse. Document Released: 08/22/2000 Document Revised: 03/03/2013 Document Reviewed: 09/15/2010 [facility] Patient Information 2015 [place] [place]. This information is not intended to replace advice given to you by your health care provider. Make sure you discuss any questions you have with your health care provider.

## 2013-10-28 NOTE — ED Provider Notes (Signed)
CSN: [id number]     Arrival date & time 10/28/13  1251 History   First MD Initiated Contact with Patient 10/28/13 1253     Chief Complaint  Patient presents with  . Dizziness   HPI [name] [name] is a 38 yo woman with a PMH of anxiety/depression, IBS, alcohol abuse and complete heart block (now s/p pacemaker placement) who is here with dizziness since yesterday morning. She describes the dizziness as "the room spinning" and "unsteadiness while walking". She has also noted decreased appetite, vomiting x2, diarrhea, light-headedness upon standing, shortness of breath and headache. She has had episodes like this one many times in the past.  Past Medical History  Diagnosis Date  . Abnormal Pap smear 2004    colpo  . H/O candidiasis   . H/O varicella 2005  . Pelvic pain in female 2007  . Candida vaginitis 05/2006  . IBS (irritable bowel syndrome)   . 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 Depression   . Anxiety   . Panic attack   . Alcohol abuse    Past Surgical History  Procedure Laterality Date  . Tonsillectomy  1995  . Cholecystectomy    . Ep study and ablation  04/29/12    slow pathway ablation by Dr Johney Frame   . Pacemaker insertion  04/30/12    Medtronic Adapta L implanted by Dr Ladona Ridgel for complete heart block   Family History  Problem Relation Age of Onset  . Diabetes Mother   . Cancer Sister     Lump removed frfom shoulder  . Heart disease Maternal Grandfather     MI  . Cancer Paternal Grandmother     breast   History  Substance Use Topics  . Smoking status: Former Smoker -- 0.50 packs/day    Types: Cigarettes  . Smokeless tobacco: Never Used  . Alcohol Use: Yes   OB History   Grav Para Term Preterm Abortions TAB SAB Ect Mult Living   1 1 1       1      Review of Systems General: no recent illness Skin: no rashes or lesions HEENT: headache Cardiac: no  chest pain or palpitations Respiratory: SOB GI: diarrhea, decreased appetite, nausea Urinary: no changes in urination Endocrine: no temperature intolerance or weight change Psychiatric: baseline anxiety/depression   Allergies  Review of patient's allergies indicates no known allergies.  Home Medications   Prior to Admission medications   Medication Sig Start Date End Date Taking? Authorizing Provider  ALPRAZolam Prudy Feeler) 0.5 MG tablet Take 1 tablet (0.5 mg total) by mouth 3 (three) times daily as needed for anxiety. 09/29/13   Sheliah Hatch, MD  Aspirin-Salicylamide-Caffeine (BC HEADACHE POWDER PO) Take 1 packet by mouth daily as needed (for headaches).    Historical Provider, MD  escitalopram (LEXAPRO) 20 MG tablet Take 1 tablet (20 mg total) by mouth daily. 09/29/13   Wanda Plump, MD  JUNEL FE 1/20 1-20 MG-MCG tablet Take 1 tablet by mouth daily. 09/15/12   Historical Provider, MD  Lorcaserin HCl (BELVIQ) 10 MG TABS 1 po bid 10/15/13   Lelon Perla, DO  Lorcaserin HCl (BELVIQ) 10 MG TABS 1 po bid 10/15/13   Grayling Congress Lowne, DO  Suvorexant (BELSOMRA) 10 MG TABS Take 1 tablet by mouth at bedtime as needed. 10/15/13   Grayling Congress Lowne, DO   BP 107/77  Pulse 80  Temp(Src) 98 F (36.7 C) (Oral)  Resp 16  Ht 5\' 4"  (1.626 m)  Wt 151 lb (68.493 kg)  BMI 25.91 kg/m2  SpO2 99%  LMP 10/27/2013 Physical Exam Appearance: sitting up in bed with son and mother-in-law by her side; wearing mask HEENT: [place]/AT, PERRL, EOMi, dry mucous membranes Heart: RRR, normal S1S2 Lungs: CTAB Abdomen: BS+, no tenderness to palpation, no hepatosplenomegaly Musculoskeletal: nontender Extremities: no edema b/l Neurologic: A&Ox3, strength and sensation intact throughout Skin: no lesions or rashes   ED Course  Procedures (including critical care time) Labs Review Labs Reviewed  COMPREHENSIVE METABOLIC PANEL - Abnormal; Notable for the following:    Glucose, Bld 100 (*)    All other components within normal  limits  LIPASE, BLOOD  CBC  ETHANOL   Ethanol <11  Imaging Review No results found.   EKG Interpretation   Date/Time:  Wednesday October 28 2013 13:26:57 EDT Ventricular Rate:  74 PR Interval:  104 QRS Duration: 138 QT Interval:  462 QTC Calculation: 512 R Axis:   -79 Text Interpretation:  Atrial-sensed ventricular-paced rhythm Abnormal ECG  No significant change since last tracing Confirmed by BEATON  MD, ROBERT  ([id number]) on 10/28/2013 1:51:08 PM      MDM   Final diagnoses:  None    [name] [name] is a 38 yo woman with a history of complete heart block (now with a pacemaker) who is here for one day of dizziness. She appeared dry, and was treated with IVF. Her EKG appeared normal (other than atrial-sensed ventricular-paced rhythm 2/2 her pacemaker). Her labs were WNL. She was hydrated and sent home with encouragement of supportive therapy.     Dionne AnoJulia Horst Ostermiller, MD 10/28/13 1409

## 2013-10-28 NOTE — ED Provider Notes (Signed)
I saw and evaluated the patient, reviewed the resident's note and I agree with the findings and plan.   .Face to face Exam:  General:  Awake HEENT:  Atraumatic Resp:  Normal effort Abd:  Nondistended Neuro:No focal weakness   EKG discussed and reviewed with resident  Nelia Shiobert L Zevin Nevares, MD 10/28/13 1453

## 2013-10-28 NOTE — ED Notes (Signed)
Reports dizziness since yesterday, emesis, light headedness. No diarrhea or fever

## 2013-11-17 ENCOUNTER — Other Ambulatory Visit: Payer: Self-pay | Admitting: Internal Medicine

## 2013-12-01 ENCOUNTER — Other Ambulatory Visit: Payer: Self-pay | Admitting: Family Medicine

## 2013-12-01 DIAGNOSIS — F419 Anxiety disorder, unspecified: Secondary | ICD-10-CM

## 2013-12-01 MED ORDER — ALPRAZOLAM 0.5 MG PO TABS: 0.5000 mg | ORAL_TABLET | Freq: Three times a day (TID) | ORAL | Status: DC | PRN

## 2013-12-01 NOTE — Telephone Encounter (Signed)
Listed as A tabori pt, PCP is lowne

## 2013-12-01 NOTE — Telephone Encounter (Signed)
Last seen 10/15/13 and filled 09/29/13 #60. Please advise     KP

## 2013-12-09 ENCOUNTER — Encounter: Payer: Self-pay | Admitting: *Deleted

## 2013-12-14 ENCOUNTER — Ambulatory Visit (INDEPENDENT_AMBULATORY_CARE_PROVIDER_SITE_OTHER): Payer: 59 | Admitting: *Deleted

## 2013-12-14 DIAGNOSIS — I442 Atrioventricular block, complete: Secondary | ICD-10-CM

## 2013-12-14 NOTE — Progress Notes (Signed)
Remote pacemaker transmission.   

## 2013-12-15 LAB — MDC_IDC_ENUM_SESS_TYPE_REMOTE
Brady Statistic AP VP Percent: 5 %
Brady Statistic AS VP Percent: 95 %
Brady Statistic AS VS Percent: 0 %
Date Time Interrogation Session: 20151005174635
Lead Channel Impedance Value: 524 Ohm
Lead Channel Impedance Value: 580 Ohm
Lead Channel Pacing Threshold Amplitude: 0.875 V
Lead Channel Pacing Threshold Pulse Width: 0.4 ms
Lead Channel Sensing Intrinsic Amplitude: 2.8 mV
Lead Channel Setting Pacing Amplitude: 2 V
MDC IDC MSMT BATTERY IMPEDANCE: 157 Ohm
MDC IDC MSMT BATTERY REMAINING LONGEVITY: 121 mo
MDC IDC MSMT BATTERY VOLTAGE: 2.79 V
MDC IDC MSMT LEADCHNL RV PACING THRESHOLD AMPLITUDE: 0.75 V
MDC IDC MSMT LEADCHNL RV PACING THRESHOLD PULSEWIDTH: 0.4 ms
MDC IDC SET LEADCHNL RV PACING AMPLITUDE: 2.5 V
MDC IDC SET LEADCHNL RV PACING PULSEWIDTH: 0.4 ms
MDC IDC SET LEADCHNL RV SENSING SENSITIVITY: 2.8 mV
MDC IDC STAT BRADY AP VS PERCENT: 0 %

## 2014-01-05 ENCOUNTER — Ambulatory Visit (INDEPENDENT_AMBULATORY_CARE_PROVIDER_SITE_OTHER): Payer: 59 | Admitting: Licensed Clinical Social Worker

## 2014-01-05 DIAGNOSIS — F332 Major depressive disorder, recurrent severe without psychotic features: Secondary | ICD-10-CM

## 2014-01-11 ENCOUNTER — Other Ambulatory Visit: Payer: Self-pay | Admitting: Family Medicine

## 2014-01-11 ENCOUNTER — Encounter (HOSPITAL_BASED_OUTPATIENT_CLINIC_OR_DEPARTMENT_OTHER): Payer: Self-pay | Admitting: Emergency Medicine

## 2014-01-11 DIAGNOSIS — F419 Anxiety disorder, unspecified: Secondary | ICD-10-CM

## 2014-01-11 MED ORDER — ALPRAZOLAM 0.5 MG PO TABS
0.5000 mg | ORAL_TABLET | Freq: Three times a day (TID) | ORAL | Status: DC | PRN
Start: 2014-01-11 — End: 2014-01-26

## 2014-01-11 NOTE — Telephone Encounter (Signed)
Last seen 10/15/13 and filled 12/01/13 #60  Please advise    KP

## 2014-01-12 ENCOUNTER — Ambulatory Visit (INDEPENDENT_AMBULATORY_CARE_PROVIDER_SITE_OTHER): Payer: 59 | Admitting: Licensed Clinical Social Worker

## 2014-01-12 DIAGNOSIS — F332 Major depressive disorder, recurrent severe without psychotic features: Secondary | ICD-10-CM

## 2014-01-17 ENCOUNTER — Other Ambulatory Visit: Payer: Self-pay | Admitting: Internal Medicine

## 2014-01-26 ENCOUNTER — Ambulatory Visit (INDEPENDENT_AMBULATORY_CARE_PROVIDER_SITE_OTHER): Payer: 59 | Admitting: Family Medicine

## 2014-01-26 ENCOUNTER — Encounter: Payer: Self-pay | Admitting: Family Medicine

## 2014-01-26 ENCOUNTER — Ambulatory Visit (INDEPENDENT_AMBULATORY_CARE_PROVIDER_SITE_OTHER): Payer: 59 | Admitting: Licensed Clinical Social Worker

## 2014-01-26 DIAGNOSIS — F411 Generalized anxiety disorder: Secondary | ICD-10-CM

## 2014-01-26 DIAGNOSIS — F332 Major depressive disorder, recurrent severe without psychotic features: Secondary | ICD-10-CM

## 2014-01-26 MED ORDER — LORCASERIN HCL 10 MG PO TABS: ORAL_TABLET | ORAL | Status: DC

## 2014-01-26 MED ORDER — ESCITALOPRAM OXALATE 20 MG PO TABS: ORAL_TABLET | ORAL | Status: DC

## 2014-01-26 MED ORDER — CLONAZEPAM 0.5 MG PO TABS: 0.5000 mg | ORAL_TABLET | Freq: Two times a day (BID) | ORAL | Status: DC | PRN

## 2014-01-26 NOTE — Progress Notes (Signed)
Pre visit review using our clinic review tool, if applicable. No additional management support is needed unless otherwise documented below in the visit note. 

## 2014-01-26 NOTE — Progress Notes (Signed)
   Subjective:    Patient ID: [name] [name], female    DOB: 04-23-1975, 38 y.o.   MRN: [id number]  HPI Pt here to discuss weight loss-- she has struggled since she stopped smoking and would like to know try belviq. She is also f/u anxiety and is doing well with med and counseling.  She is still having a lot of trouble sleeping---she did not like the belsmra at all. No other complaints   Review of Systems As above    Objective:   Physical Exam  BP 120/85 mmHg  Pulse 89  Temp(Src) 98.5 F (36.9 C) (Oral)  Wt 179 lb 12.8 oz (81.557 kg)  SpO2 95% General appearance: alert, cooperative, appears stated age and no distress Neck: no adenopathy, supple, symmetrical, trachea midline and thyroid not enlarged, symmetric, no tenderness/mass/nodules Lungs: clear to auscultation bilaterally Heart: regular rate and rhythm, S1, S2 normal, no murmur, click, rub or gallop Extremities: extremities normal, atraumatic, no cyanosis or edema Neurologic: Alert and oriented X 3, normal strength and tone. Normal symmetric reflexes. Normal coordination and gait       Assessment & Plan:  1. Severe obesity (BMI >= 40) rto 3 months or sooner prn-- diet and exercise discussed - Lorcaserin HCl (BELVIQ) 10 MG TABS; 1 po bid  Dispense: 30 tablet; Refill: 2  2. Generalized anxiety disorder con't lexapro D/c xanax - clonazePAM (KLONOPIN) 0.5 MG tablet; Take 1 tablet (0.5 mg total) by mouth 2 (two) times daily as needed for anxiety.  Dispense: 60 tablet; Refill: 1

## 2014-01-26 NOTE — Patient Instructions (Signed)
Generalized Anxiety Disorder Generalized anxiety disorder (GAD) is a mental disorder. It interferes with life functions, including relationships, work, and school. GAD is different from normal anxiety, which everyone experiences at some point in their lives in response to specific life events and activities. Normal anxiety actually helps us prepare for and get through these life events and activities. Normal anxiety goes away after the event or activity is over.  GAD causes anxiety that is not necessarily related to specific events or activities. It also causes excess anxiety in proportion to specific events or activities. The anxiety associated with GAD is also difficult to control. GAD can vary from mild to severe. People with severe GAD can have intense waves of anxiety with physical symptoms (panic attacks).  SYMPTOMS The anxiety and worry associated with GAD are difficult to control. This anxiety and worry are related to many life events and activities and also occur more days than not for 6 months or longer. People with GAD also have three or more of the following symptoms (one or more in children):  Restlessness.   Fatigue.  Difficulty concentrating.   Irritability.  Muscle tension.  Difficulty sleeping or unsatisfying sleep. DIAGNOSIS GAD is diagnosed through an assessment by your health care provider. Your health care provider will ask you questions aboutyour mood,physical symptoms, and events in your life. Your health care provider may ask you about your medical history and use of alcohol or drugs, including prescription medicines. Your health care provider may also do a physical exam and blood tests. Certain medical conditions and the use of certain substances can cause symptoms similar to those associated with GAD. Your health care provider may refer you to a mental health specialist for further evaluation. TREATMENT The following therapies are usually used to treat GAD:    Medication. Antidepressant medication usually is prescribed for long-term daily control. Antianxiety medicines may be added in severe cases, especially when panic attacks occur.   Talk therapy (psychotherapy). Certain types of talk therapy can be helpful in treating GAD by providing support, education, and guidance. A form of talk therapy called cognitive behavioral therapy can teach you healthy ways to think about and react to daily life events and activities.  Stress managementtechniques. These include yoga, meditation, and exercise and can be very helpful when they are practiced regularly. A mental health specialist can help determine which treatment is best for you. Some people see improvement with one therapy. However, other people require a combination of therapies. Document Released: 06/23/2012 Document Revised: 07/13/2013 Document Reviewed: 06/23/2012 [employer] Patient Information 2015 [employer]. This information is not intended to replace advice given to you by your health care provider. Make sure you discuss any questions you have with your health care provider.  

## 2014-02-02 ENCOUNTER — Ambulatory Visit: Payer: 59 | Admitting: Licensed Clinical Social Worker

## 2014-02-16 ENCOUNTER — Ambulatory Visit: Payer: 59 | Admitting: Licensed Clinical Social Worker

## 2014-02-18 ENCOUNTER — Encounter (HOSPITAL_COMMUNITY): Payer: Self-pay | Admitting: Internal Medicine

## 2014-03-01 ENCOUNTER — Ambulatory Visit (INDEPENDENT_AMBULATORY_CARE_PROVIDER_SITE_OTHER): Payer: 59 | Admitting: Internal Medicine

## 2014-03-01 ENCOUNTER — Encounter: Payer: Self-pay | Admitting: Internal Medicine

## 2014-03-01 VITALS — BP 96/68 | HR 63 | Ht 67.0 in | Wt 183.1 lb

## 2014-03-01 DIAGNOSIS — I442 Atrioventricular block, complete: Secondary | ICD-10-CM

## 2014-03-01 LAB — MDC_IDC_ENUM_SESS_TYPE_INCLINIC
Battery Impedance: 157 Ohm
Battery Remaining Longevity: 98 mo
Brady Statistic AP VP Percent: 6 %
Brady Statistic AP VS Percent: 0 %
Lead Channel Impedance Value: 528 Ohm
Lead Channel Pacing Threshold Pulse Width: 0.4 ms
Lead Channel Pacing Threshold Pulse Width: 0.46 ms
Lead Channel Sensing Intrinsic Amplitude: 11.2 mV
Lead Channel Sensing Intrinsic Amplitude: 4 mV
Lead Channel Setting Pacing Amplitude: 2 V
Lead Channel Setting Pacing Pulse Width: 0.46 ms
MDC IDC MSMT BATTERY VOLTAGE: 2.79 V
MDC IDC MSMT LEADCHNL RA PACING THRESHOLD AMPLITUDE: 1 V
MDC IDC MSMT LEADCHNL RV IMPEDANCE VALUE: 505 Ohm
MDC IDC MSMT LEADCHNL RV PACING THRESHOLD AMPLITUDE: 0.75 V
MDC IDC SESS DTM: 20151221214311
MDC IDC SET LEADCHNL RV PACING AMPLITUDE: 2.5 V
MDC IDC SET LEADCHNL RV SENSING SENSITIVITY: 2.8 mV
MDC IDC STAT BRADY AS VP PERCENT: 94 %
MDC IDC STAT BRADY AS VS PERCENT: 0 %

## 2014-03-01 NOTE — Progress Notes (Signed)
[name] [name] is a 38 y.o. female who presents today for electrophysiology followup.  She continues to have chronic difficulty with anxiety.  She is now followed by [name] [name] and finds this helpful.  She says that she is no longer drinking ETOh heavily and has reduced her tobacco intake.  She has occasional postural dizziness.  She also has decreased exercise tolerance chronically.  She is not very active and has had significant weight gain.  Today, she denies symptoms of palpitations, chest pain, shortness of breath, or further syncope. The patient is otherwise without complaint today.   Past Medical History  Diagnosis Date  . Abnormal Pap smear 2004    colpo  . H/O candidiasis   . H/O varicella 2005  . Pelvic pain in female 2007  . Candida vaginitis 05/2006  . IBS (irritable bowel syndrome)   . 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 Depression   . Anxiety   . Panic attack   . Alcohol abuse    Past Surgical History  Procedure Laterality Date  . Tonsillectomy  1995  . Cholecystectomy    . Ep study and ablation  04/29/12    slow pathway ablation by [name] Johney FrameAllred   . Pacemaker insertion  04/30/12    Medtronic Adapta L implanted by [name] Ladona Ridgelaylor for complete heart block  . Supraventricular tachycardia ablation N/A 04/29/2012    Procedure: SUPRAVENTRICULAR TACHYCARDIA ABLATION;  Surgeon: Hillis RangeJames Daiton Cowles, MD;  [facility];  Service: Cardiovascular;  Laterality: N/A;  . Permanent pacemaker insertion N/A 04/30/2012    Procedure: PERMANENT PACEMAKER INSERTION;  Surgeon: Marinus MawGregg W Taylor, MD;  [facility];  Service: Cardiovascular;  Laterality: N/A;    Current Outpatient Prescriptions  Medication Sig Dispense Refill  . ALPRAZolam (XANAX) 0.5 MG tablet Take 0.5 mg by mouth as needed. anxiety    . Aspirin-Salicylamide-Caffeine (BC HEADACHE POWDER PO) Take 1 packet by mouth  daily as needed (for headaches).    . clonazePAM (KLONOPIN) 0.5 MG tablet Take 1 tablet (0.5 mg total) by mouth 2 (two) times daily as needed for anxiety. 60 tablet 1  . escitalopram (LEXAPRO) 20 MG tablet TAKE 1 TABLET (20 MG TOTAL) BY MOUTH DAILY. 90 tablet 1  . JUNEL FE 1/20 1-20 MG-MCG tablet Take 1 tablet by mouth daily.    . Lorcaserin HCl (BELVIQ) 10 MG TABS 1 po bid (Patient taking differently: Take 1 tablet by mouth 2 (two) times daily. ) 30 tablet 2  . promethazine (PHENERGAN) 12.5 MG tablet Take 1 tablet (12.5 mg total) by mouth every 6 (six) hours as needed for nausea or vomiting. 30 tablet 0   No current facility-administered medications for this visit.    Physical Exam: Filed Vitals:   03/01/14 1640  BP: 96/68  Pulse: 63  Height: 5\' 7"  (1.702 m)  Weight: 183 lb 1.9 oz (83.063 kg)    GEN- The patient is well appearing, NAD  Head- normocephalic, atraumatic Eyes-  Sclera clear, conjunctiva pink Ears- hearing intact Oropharynx- clear Lungs- Clear to ausculation bilaterally, normal work of breathing Chest- pacemaker pocket is well healed Heart- Regular rate and rhythm, no murmurs, rubs or gallops, PMI not laterally displaced GI- soft, NT, ND, + BS Extremities- no clubbing, cyanosis, or edema, arm swelling is resolved  Pacemaker interrogation- reviewed in detail today,  See PACEART report  Assessment and Plan:  1. Complete heart block Normal pacemaker function See [name] [name] report Doing very well  2. Palpitations- resolved   3. Situational anxiety/ depression (job related)- followed by [name] [name]  4. Tobacco/ ETOH- I am encouraged by her lifestyle changes  5. Postural dizziness  Increased salt intake and adequate hydration are encouraged  Carelink in 3 months I will see again in 12 months

## 2014-03-01 NOTE — Patient Instructions (Signed)
Your physician wants you to follow-up in: 12 months with Dr. Allred You will receive a reminder letter in the mail two months in advance. If you don't receive a letter, please call our office to schedule the follow-up appointment.   Remote monitoring is used to monitor your Pacemaker of ICD from home. This monitoring reduces the number of office visits required to check your device to one time per year. It allows us to keep an eye on the functioning of your device to ensure it is working properly. You are scheduled for a device check from home on 05/31/14. You may send your transmission at any time that day. If you have a wireless device, the transmission will be sent automatically. After your physician reviews your transmission, you will receive a postcard with your next transmission date.   

## 2014-03-05 IMAGING — CR DG CHEST 2V
2 series · 2 of 2 positions shown · non-contrast
Comparison: 05/01/2012 and 01/16/2009 chest radiographs

CLINICAL DATA: Shortness of breath and arm pain.

CHEST - 2 VIEW

[w chest pa]
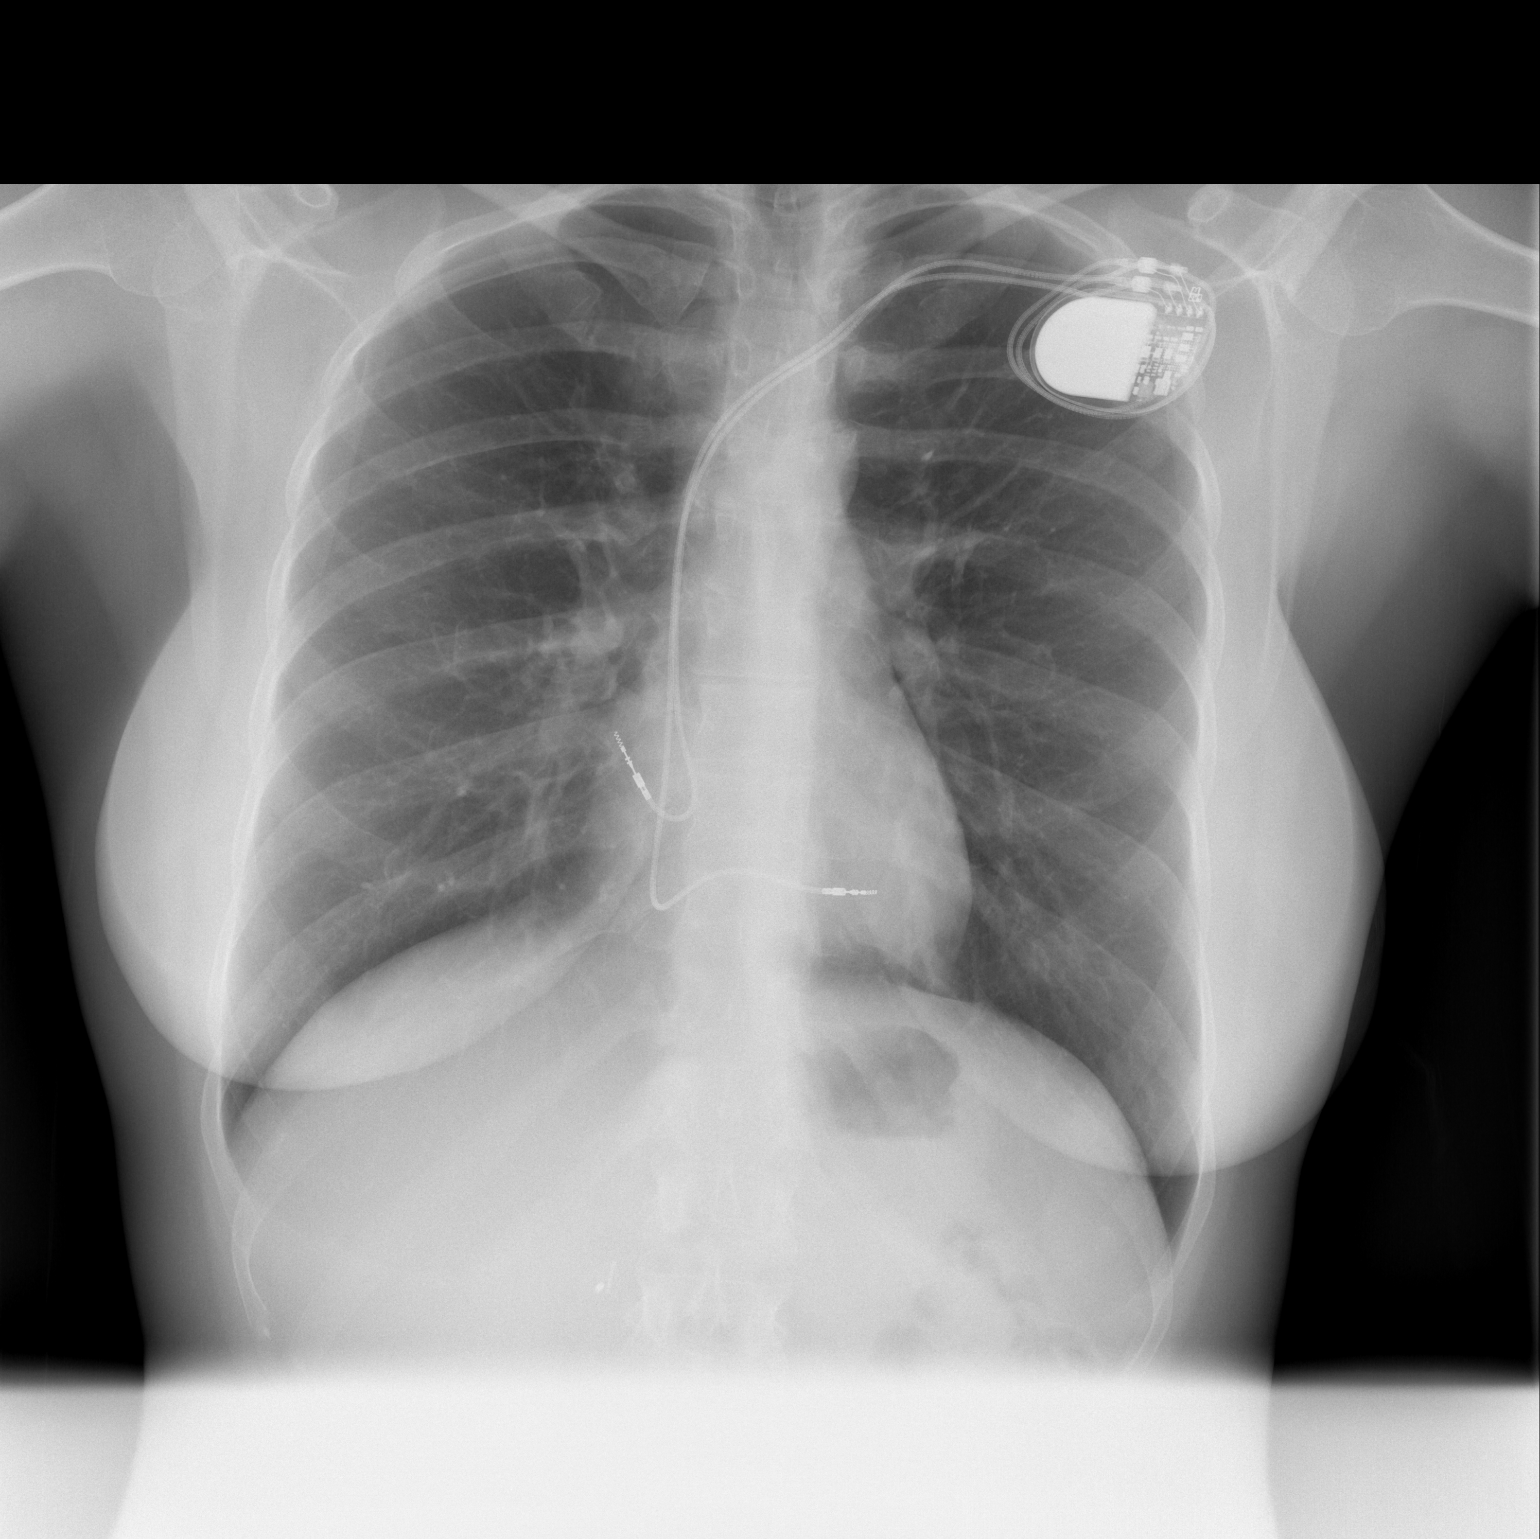

[w chest lat]
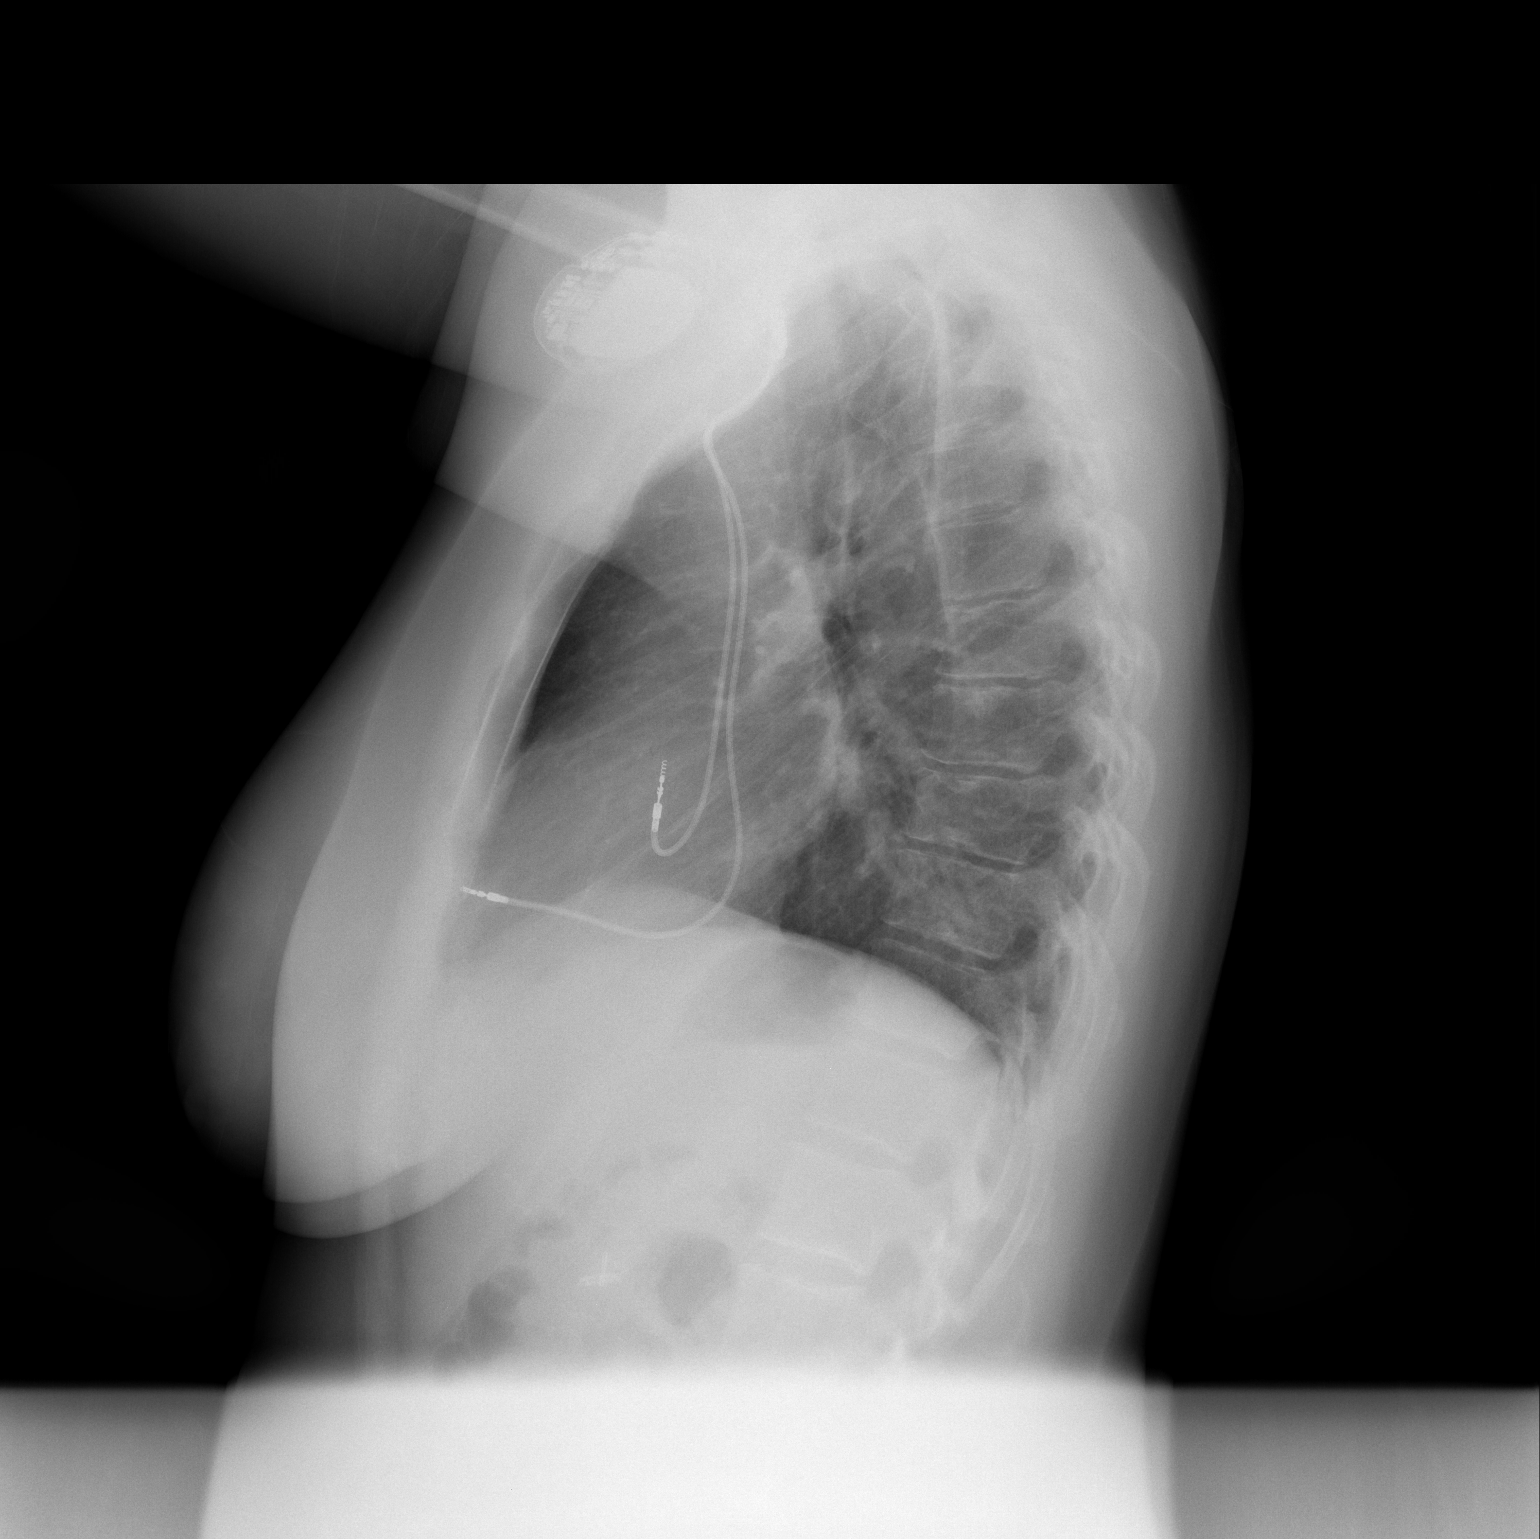

[2 of 2 positions shown; findings below may reference images not displayed]

FINDINGS: The cardiomediastinal silhouette is unremarkable.
A left-sided pacemaker with leads overlying the right atrium and
right ventricle again noted.
There is no evidence of focal airspace disease, pulmonary edema,
suspicious pulmonary nodule/mass, pleural effusion, or
pneumothorax.
No acute bony abnormalities are identified.
IMPRESSION: No evidence of active cardiopulmonary disease.

## 2014-03-15 ENCOUNTER — Encounter: Payer: Self-pay | Admitting: Internal Medicine

## 2014-03-19 ENCOUNTER — Encounter: Payer: Self-pay | Admitting: Family Medicine

## 2014-03-19 ENCOUNTER — Ambulatory Visit (INDEPENDENT_AMBULATORY_CARE_PROVIDER_SITE_OTHER): Payer: 59 | Admitting: Family Medicine

## 2014-03-19 VITALS — BP 84/56 | HR 85 | Temp 97.7°F | Resp 16 | Wt 185.8 lb

## 2014-03-19 DIAGNOSIS — I950 Idiopathic hypotension: Secondary | ICD-10-CM

## 2014-03-19 DIAGNOSIS — G4700 Insomnia, unspecified: Secondary | ICD-10-CM | POA: Insufficient documentation

## 2014-03-19 LAB — HEPATIC FUNCTION PANEL
ALBUMIN: 4.1 g/dL (ref 3.5–5.2)
ALK PHOS: 52 U/L (ref 39–117)
ALT: 37 U/L — ABNORMAL HIGH (ref 0–35)
AST: 27 U/L (ref 0–37)
BILIRUBIN DIRECT: 0.1 mg/dL (ref 0.0–0.3)
BILIRUBIN INDIRECT: 0.1 mg/dL — AB (ref 0.2–1.2)
BILIRUBIN TOTAL: 0.2 mg/dL (ref 0.2–1.2)
TOTAL PROTEIN: 6.6 g/dL (ref 6.0–8.3)

## 2014-03-19 LAB — BASIC METABOLIC PANEL
BUN: 10 mg/dL (ref 6–23)
CO2: 23 mEq/L (ref 19–32)
CREATININE: 0.76 mg/dL (ref 0.50–1.10)
Calcium: 9.7 mg/dL (ref 8.4–10.5)
Chloride: 106 mEq/L (ref 96–112)
Glucose, Bld: 111 mg/dL — ABNORMAL HIGH (ref 70–99)
Potassium: 4.7 mEq/L (ref 3.5–5.3)
SODIUM: 141 meq/L (ref 135–145)

## 2014-03-19 MED ORDER — FLUDROCORTISONE ACETATE 0.1 MG PO TABS: ORAL_TABLET | ORAL | Status: DC

## 2014-03-19 NOTE — Progress Notes (Signed)
Pre visit review using our clinic review tool, if applicable. No additional management support is needed unless otherwise documented below in the visit note. 

## 2014-03-19 NOTE — Patient Instructions (Signed)
Fludrocortisone tablets What is this medicine? FLUDROCORTISONE (floo droe KOR ti sone) is a corticosteroid. It is used to treat Addison's disease and to treat a salt losing condition called adrenogenital syndrome. This medicine may be used for other purposes; ask your health care provider or pharmacist if you have questions. COMMON BRAND NAME(S): Florinef What should I tell my health care provider before I take this medicine? They need to know if you have any of these conditions: -Cushing's syndrome -diabetes -heart problems or disease -high blood pressure -infection like herpes, measles, tuberculosis, or chickenpox -liver disease -myasthenia gravis -osteoporosis -stomach, ulcer or intestine disease including colitis and diverticulitis -thyroid problem -an unusual or allergic reaction to fludrocortisone, corticosteroids, other medicines, lactose, foods, dyes, or preservatives -pregnant or trying to get pregnant -breast-feeding How should I use this medicine? Take this medicine by mouth with a glass of water. Follow the directions on the prescription label. Take it with food or milk to avoid stomach upset. If you are taking this medicine once a day, take it in the morning. Do not take more medicine than you are told to take. Do not suddenly stop taking your medicine because you may develop a severe reaction. Your doctor will tell you how much medicine to take. If your doctor wants you to stop the medicine, the dose may be slowly lowered over time to avoid any side effects. Talk to your pediatrician regarding the use of this medicine in children. Special care may be needed. Patients over 39 years old may have a stronger reaction and need a smaller dose. Overdosage: If you think you have taken too much of this medicine contact a poison control center or emergency room at once. NOTE: This medicine is only for you. Do not share this medicine with others. What if I miss a dose? If you miss a  dose, take it as soon as you can. If it is almost time for your next dose, take only that dose. Do not take double or extra doses. What may interact with this medicine? Do not take this medicine with any of the following medications: -mifepristone, RU-486 This medicine may also interact with the following medications: -amphotericin B -aspirin and aspirin-like drugs -barbiturates like phenobarbital -digoxin -diuretics -female hormones, like estrogens or progestins and birth control pills -female hormones -medicines for diabetes like insulin -medicines that treat or prevent blood clots like warfarin -phenytoin -rifampin -vaccines This list may not describe all possible interactions. Give your health care provider a list of all the medicines, herbs, non-prescription drugs, or dietary supplements you use. Also tell them if you smoke, drink alcohol, or use illegal drugs. Some items may interact with your medicine. What should I watch for while using this medicine? Visit your doctor or health care professional for regular checks on your progress. If you are taking this medicine over a prolonged period, carry an identification card with your name and address, the type and dose of your medicine, and your doctor's name and address. This medicine may increase your risk of getting an infection. Stay away from people who are sick. Tell your doctor or health care professional if you are around anyone with measles or chickenpox. If you are going to have surgery, tell your doctor or health care professional that you have taken this medicine within the last twelve months. Ask your doctor or health care professional about your diet. You may need to lower the amount of salt you eat. The medicine can increase your blood sugar. If  you are a diabetic check with your doctor if you need help adjusting the dose of your diabetic medicine. What side effects may I notice from receiving this medicine? Side effects that  you should report to your doctor or health care professional as soon as possible: -changes in vision -mental depression, mood swings, mistaken feelings of self importance or of being mistreated -sudden weight gain -swelling of the feet or lower legs -unusually weak or tired Side effects that usually do not require medical attention (report to your doctor or health care professional if they continue or are bothersome): -dizziness -headache -loss of appetite -nausea, vomiting -trouble sleeping This list may not describe all possible side effects. Call your doctor for medical advice about side effects. You may report side effects to FDA at 1-800-FDA-1088. Where should I keep my medicine? Keep out of the reach of children. Store at room temperature between 15 and 30 degrees C (59 and 86 degrees F). Protect from excessive heat. Throw away any unused medicine after the expiration date. NOTE: This sheet is a summary. It may not cover all possible information. If you have questions about this medicine, talk to your doctor, pharmacist, or health care provider.  2015, [employer] Standard. (2007-07-07 15:29:43)

## 2014-03-19 NOTE — Progress Notes (Signed)
Subjective:    Patient ID: [name] [name], female    DOB: 22-Sep-1975, 39 y.o.   MRN: [id number]  HPI Pt  Here c/o dizziness and low bp.  She has been seen by cardiology.  Pacemaker / heart was fine per cardio.  Pt states she was told by them to f/u here for low bp but to inc salt intake.  Pt is c/o dizziness , and fatigue.  No NVD. No fever / chills.     Past Medical History  Diagnosis Date  . Abnormal Pap smear 2004    colpo  . H/O candidiasis   . H/O varicella 2005  . Pelvic pain in female 2007  . Candida vaginitis 05/2006  . IBS (irritable bowel syndrome)   . 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 Depression   . Anxiety   . Panic attack   . Alcohol abuse    Current Outpatient Prescriptions  Medication Sig Dispense Refill  . Aspirin-Salicylamide-Caffeine (BC HEADACHE POWDER PO) Take 1 packet by mouth daily as needed (for headaches).    . clonazePAM (KLONOPIN) 0.5 MG tablet Take 1 tablet (0.5 mg total) by mouth 2 (two) times daily as needed for anxiety. 60 tablet 1  . escitalopram (LEXAPRO) 20 MG tablet TAKE 1 TABLET (20 MG TOTAL) BY MOUTH DAILY. 90 tablet 1  . JUNEL FE 1/20 1-20 MG-MCG tablet Take 1 tablet by mouth daily.    . Lorcaserin HCl (BELVIQ) 10 MG TABS 1 po bid (Patient taking differently: Take 1 tablet by mouth 2 (two) times daily. ) 30 tablet 2  . fludrocortisone (FLORINEF) 0.1 MG tablet 1 po qd 30 tablet 2   No current facility-administered medications for this visit.   Past Surgical History  Procedure Laterality Date  . Tonsillectomy  1995  . Cholecystectomy    . Ep study and ablation  04/29/12    slow pathway ablation by Dr Johney Frame   . Pacemaker insertion  04/30/12    Medtronic Adapta L implanted by Dr Ladona Ridgel for complete heart block  . Supraventricular tachycardia ablation N/A 04/29/2012    Procedure: SUPRAVENTRICULAR TACHYCARDIA ABLATION;   Surgeon: Hillis Range, MD;  Location: [facility] CATH LAB;  Service: Cardiovascular;  Laterality: N/A;  . Permanent pacemaker insertion N/A 04/30/2012    Procedure: PERMANENT PACEMAKER INSERTION;  Surgeon: Marinus Maw, MD;  Location: [facility] CATH LAB;  Service: Cardiovascular;  Laterality: N/A;     Review of Systems  Constitutional: Negative for fever, chills, diaphoresis, activity change, appetite change, fatigue and unexpected weight change.  Neurological: Positive for dizziness and light-headedness. Negative for tremors, seizures, syncope, facial asymmetry, speech difficulty, weakness, numbness and headaches.  Psychiatric/Behavioral: Negative for suicidal ideas, hallucinations, behavioral problems, confusion, sleep disturbance, self-injury, dysphoric mood, decreased concentration and agitation. The patient is not nervous/anxious and is not hyperactive.        Objective:   Physical Exam BP 84/56 mmHg  Pulse 85  Temp(Src) 97.7 F (36.5 C) (Oral)  Resp 16  Wt 185 lb 12.8 oz (84.278 kg)  SpO2 97% General appearance: alert, cooperative, appears stated age and no distress Eyes: conjunctivae/corneas clear. PERRL, EOM's intact. Fundi benign. Nose: Nares normal. Septum midline. Mucosa normal. No drainage or sinus tenderness. Throat: lips, mucosa, and tongue normal; teeth and gums normal Neck: no adenopathy, no carotid bruit, no JVD, supple, symmetrical, trachea midline and thyroid not enlarged,  symmetric, no tenderness/mass/nodules Back: symmetric, no curvature. ROM normal. No CVA tenderness. Lungs: clear to auscultation bilaterally Heart: S1, S2 normal Extremities: extremities normal, atraumatic, no cyanosis or edema        Assessment & Plan:  1. Idiopathic hypotension Inc salt in diet, check labs rto Monday am-- check cortisol level If symptoms worsen over weekend-- --- go to ER Inc po fluids - fludrocortisone (FLORINEF) 0.1 MG tablet; 1 po qd  Dispense: 30 tablet; Refill: 2 - POCT  urinalysis dipstick - POCT urine pregnancy - TSH - Hepatic function panel - CBC with Differential - Basic Metabolic Panel (BMET)

## 2014-03-20 LAB — CBC WITH DIFFERENTIAL/PLATELET
Basophils Absolute: 0 10*3/uL (ref 0.0–0.1)
Basophils Relative: 0 % (ref 0–1)
EOS ABS: 0.1 10*3/uL (ref 0.0–0.7)
EOS PCT: 1 % (ref 0–5)
HCT: 40.3 % (ref 36.0–46.0)
HEMOGLOBIN: 13.4 g/dL (ref 12.0–15.0)
LYMPHS PCT: 38 % (ref 12–46)
Lymphs Abs: 3.1 10*3/uL (ref 0.7–4.0)
MCH: 29.8 pg (ref 26.0–34.0)
MCHC: 33.3 g/dL (ref 30.0–36.0)
MCV: 89.8 fL (ref 78.0–100.0)
MPV: 9.4 fL (ref 8.6–12.4)
Monocytes Absolute: 0.6 10*3/uL (ref 0.1–1.0)
Monocytes Relative: 7 % (ref 3–12)
Neutro Abs: 4.4 10*3/uL (ref 1.7–7.7)
Neutrophils Relative %: 54 % (ref 43–77)
Platelets: 284 10*3/uL (ref 150–400)
RBC: 4.49 MIL/uL (ref 3.87–5.11)
RDW: 13.3 % (ref 11.5–15.5)
WBC: 8.2 10*3/uL (ref 4.0–10.5)

## 2014-03-20 LAB — TSH: TSH: 0.844 u[IU]/mL (ref 0.350–4.500)

## 2014-03-22 ENCOUNTER — Encounter: Payer: Self-pay | Admitting: Physician Assistant

## 2014-03-22 ENCOUNTER — Ambulatory Visit (INDEPENDENT_AMBULATORY_CARE_PROVIDER_SITE_OTHER): Payer: 59 | Admitting: Physician Assistant

## 2014-03-22 VITALS — BP 115/76 | HR 83 | Temp 97.7°F | Resp 16 | Ht 64.0 in | Wt 186.4 lb

## 2014-03-22 DIAGNOSIS — I951 Orthostatic hypotension: Secondary | ICD-10-CM

## 2014-03-22 DIAGNOSIS — R55 Syncope and collapse: Secondary | ICD-10-CM

## 2014-03-22 LAB — POCT URINALYSIS DIPSTICK
BILIRUBIN UA: NEGATIVE
GLUCOSE UA: NEGATIVE
Ketones, UA: NEGATIVE
LEUKOCYTES UA: NEGATIVE
NITRITE UA: NEGATIVE
PH UA: 5.5
PROTEIN UA: NEGATIVE
Spec Grav, UA: >=1.03
Urobilinogen, UA: 0.2

## 2014-03-22 LAB — CORTISOL: CORTISOL PLASMA: 12.9 ug/dL

## 2014-03-22 LAB — POCT URINE PREGNANCY: PREG TEST UR: NEGATIVE

## 2014-03-22 NOTE — Assessment & Plan Note (Signed)
Suspect secondary to orthostatic hypotension, however with multiple episodes and residual neuro symptoms.  Neuro exam within normal limits.  Will check cortisol level today per PCP's instructions. Will also check CT Head giving multiple episodes. Increase Florinef to 0.2 mg daily. Rx Compression stockings.  Follow-up in 1 week.

## 2014-03-22 NOTE — Progress Notes (Signed)
Pre visit review using our clinic review tool, if applicable. No additional management support is needed unless otherwise documented below in the visit note/SLS  

## 2014-03-22 NOTE — Patient Instructions (Addendum)
Please go to the supply store to pick up compression stockings. Begin wearing as directed.  Take off at night.  Increase Florinef to two tablets daily.  Stop by the lab for blood work.  Then stop at front desk [facility] about scheduling your CT scan.  I will call with all results.  Follow-up in 1 week.

## 2014-03-22 NOTE — Progress Notes (Signed)
Patient presents to clinic today for 3-day follow-up of orthostatic hypotension and dizziness.  Patient was started on Florinef 0.1 mg daily.  BP has risen from last check.   BP Readings from Last 3 Encounters:  03/22/14 115/76  03/19/14 84/56  03/01/14 96/68   Patient endorses continued lightheadedness and dizziness.  Has increased fluid intake. Has also increased salt consumption. Has history of SVT s/p pacemaker placement.  Was recently interrogated without abnormal findings.  Endorses 4 syncopal episodes within the past month.  Endorses some difficulty with memory and concentration.  Denies vision changes, facial numbness or drooping.   Past Medical History  Diagnosis Date  . Abnormal Pap smear 2004    colpo  . H/O candidiasis   . H/O varicella 2005  . Pelvic pain in female 2007  . Candida vaginitis 05/2006  . IBS (irritable bowel syndrome)   . 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 Depression   . Anxiety   . Panic attack   . Alcohol abuse     Current Outpatient Prescriptions on File Prior to Visit  Medication Sig Dispense Refill  . Aspirin-Salicylamide-Caffeine (BC HEADACHE POWDER PO) Take 1 packet by mouth daily as needed (for headaches).    . clonazePAM (KLONOPIN) 0.5 MG tablet Take 1 tablet (0.5 mg total) by mouth 2 (two) times daily as needed for anxiety. 60 tablet 1  . escitalopram (LEXAPRO) 20 MG tablet TAKE 1 TABLET (20 MG TOTAL) BY MOUTH DAILY. 90 tablet 1  . fludrocortisone (FLORINEF) 0.1 MG tablet 1 po qd 30 tablet 2  . JUNEL FE 1/20 1-20 MG-MCG tablet Take 1 tablet by mouth daily.    . Lorcaserin HCl (BELVIQ) 10 MG TABS 1 po bid (Patient taking differently: Take 1 tablet by mouth 2 (two) times daily. ) 30 tablet 2   No current facility-administered medications on file prior to visit.    No Known Allergies  Family History  Problem Relation Age of  Onset  . Diabetes Mother   . Cancer Sister     Lump removed frfom shoulder  . Heart disease Maternal Grandfather     MI  . Cancer Paternal Grandmother     breast    History   Social History  . Marital Status: Married    Spouse Name: N/A    Number of Children: N/A  . Years of Education: N/A   Social History Main Topics  . Smoking status: Former Smoker -- 0.50 packs/day    Types: Cigarettes  . Smokeless tobacco: Never Used  . Alcohol Use: Yes  . Drug Use: No  . Sexual Activity:    Partners: Male    Birth Control/ Protection: Pill     Comment: loestrin fe   Other Topics Concern  . None   Social History Narrative   Lives in [place] with spouse and son age 33.   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  All other ROS are negative.  BP 115/76 mmHg  Pulse 83  Temp(Src) 97.7 F (36.5 C) (Oral)  Resp 16  Ht 5' 4"  (1.626 m)  Wt 186 lb 6 oz (84.539 kg)  BMI 31.98 kg/m2  SpO2 97%  Physical Exam  Constitutional: She is oriented to person, place, and time and well-developed, well-nourished, and in no distress.  HENT:  Head:  Normocephalic and atraumatic.  Eyes: Conjunctivae and EOM are normal. Pupils are equal, round, and reactive to light.  Neck: Neck supple.  Cardiovascular: Normal rate, regular rhythm, normal heart sounds and intact distal pulses.   Pulmonary/Chest: Effort normal and breath sounds normal. No respiratory distress. She has no wheezes. She has no rales. She exhibits no tenderness.  Lymphadenopathy:    She has no cervical adenopathy.  Neurological: She is alert and oriented to person, place, and time.  Skin: Skin is warm and dry. No rash noted.  Psychiatric: Affect normal.  Vitals reviewed.   Recent Results (from the past 2160 hour(s))  Implantable device check     Status: None   Collection Time: 03/01/14  9:43 PM  Result Value Ref Range   Date Time Interrogation Session [phone]    Pulse Generator  Manufacturer Medtronic    Pulse Gen Model [id number] Adapta    Pulse Gen Serial Number [id number]    RV Sense Sensitivity 2.8 mV   RA Pace Amplitude 2 V   RV Pace PulseWidth 0.46 ms   RV Pace Amplitude 2.5 V   RA Impedance 528 ohm   RA Amplitude 4 mV   RA Pacing Amplitude 1 V   RA Pacing PulseWidth 0.4 ms   RV IMPEDANCE 505 ohm   RV Amplitude 11.2 mV   RV Pacing Amplitude 0.75 V   RV Pacing PulseWidth 0.46 ms   Battery Status Unknown    Battery Longevity 98 mo   Battery Voltage 2.79 V   Battery Impedance 157 ohm   Brady AP VP Percent 6 %   Brady AS VP Percent 94 %   Brady AP VS Percent 0 %   Brady AS VS Percent 0 %   Eval Rhythm CHB (VS)    Miscellaneous Comment      Pacemaker check in clinic. Normal device function. Thresholds, sensing, impedances consistent with previous measurements. Device programmed to maximize longevity. No mode switch or high ventricular rates noted. Device programmed at appropriate safety  margins. Histogram distribution appropriate for patient activity level. Device programmed to optimize intrinsic conduction. Estimated longevity 8 years. Patient will follow up via [facility] on 3-21 and with JA in 12 months.   TSH     Status: None   Collection Time: 03/19/14  5:01 PM  Result Value Ref Range   TSH 0.844 0.350 - 4.500 uIU/mL  Hepatic function panel     Status: Abnormal   Collection Time: 03/19/14  5:01 PM  Result Value Ref Range   Total Bilirubin 0.2 0.2 - 1.2 mg/dL   Bilirubin, Direct 0.1 0.0 - 0.3 mg/dL   Indirect Bilirubin 0.1 (L) 0.2 - 1.2 mg/dL   Alkaline Phosphatase 52 39 - 117 U/L   AST 27 0 - 37 U/L   ALT 37 (H) 0 - 35 U/L   Total Protein 6.6 6.0 - 8.3 g/dL   Albumin 4.1 3.5 - 5.2 g/dL  CBC with Differential     Status: None   Collection Time: 03/19/14  5:01 PM  Result Value Ref Range   WBC 8.2 4.0 - 10.5 K/uL   RBC 4.49 3.87 - 5.11 MIL/uL   Hemoglobin 13.4 12.0 - 15.0 g/dL   HCT 40.3 36.0 - 46.0 %   MCV 89.8 78.0 - 100.0 fL   MCH 29.8  26.0 - 34.0 pg   MCHC 33.3 30.0 - 36.0 g/dL   RDW 13.3 11.5 - 15.5 %   Platelets 284 150 - 400 K/uL  MPV 9.4 8.6 - 12.4 fL    Comment: ** Please note change in reference range(s). **   Neutrophils Relative % 54 43 - 77 %   Neutro Abs 4.4 1.7 - 7.7 K/uL   Lymphocytes Relative 38 12 - 46 %   Lymphs Abs 3.1 0.7 - 4.0 K/uL   Monocytes Relative 7 3 - 12 %   Monocytes Absolute 0.6 0.1 - 1.0 K/uL   Eosinophils Relative 1 0 - 5 %   Eosinophils Absolute 0.1 0.0 - 0.7 K/uL   Basophils Relative 0 0 - 1 %   Basophils Absolute 0.0 0.0 - 0.1 K/uL   Smear Review Criteria for review not met   Basic Metabolic Panel (BMET)     Status: Abnormal   Collection Time: 03/19/14  5:01 PM  Result Value Ref Range   Sodium 141 135 - 145 mEq/L   Potassium 4.7 3.5 - 5.3 mEq/L   Chloride 106 96 - 112 mEq/L   CO2 23 19 - 32 mEq/L   Glucose, Bld 111 (H) 70 - 99 mg/dL   BUN 10 6 - 23 mg/dL   Creat 0.76 0.50 - 1.10 mg/dL   Calcium 9.7 8.4 - 10.5 mg/dL  Cortisol     Status: None   Collection Time: 03/22/14 11:35 AM  Result Value Ref Range   Cortisol, Plasma 12.9 ug/dL    Comment: AM:  4.3 - 22.4 ug/dLPM:  3.1 - 16.7 ug/dL  POCT urinalysis dipstick     Status: None   Collection Time: 03/22/14 12:16 PM  Result Value Ref Range   Color, UA yellow    Clarity, UA clear    Glucose, UA neg    Bilirubin, UA neg    Ketones, UA neg    Spec Grav, UA >=1.030    Blood, UA trace    pH, UA 5.5    Protein, UA neg    Urobilinogen, UA 0.2    Nitrite, UA neg    Leukocytes, UA Negative   POCT urine pregnancy     Status: None   Collection Time: 03/22/14 12:51 PM  Result Value Ref Range   Preg Test, Ur Negative     Assessment/Plan: Syncope Suspect secondary to orthostatic hypotension, however with multiple episodes and residual neuro symptoms.  Neuro exam within normal limits.  Will check cortisol level today per PCP's instructions. Will also check CT Head giving multiple episodes. Increase Florinef to 0.2 mg daily. Rx  Compression stockings.  Follow-up in 1 week.

## 2014-03-23 ENCOUNTER — Ambulatory Visit (HOSPITAL_BASED_OUTPATIENT_CLINIC_OR_DEPARTMENT_OTHER)
Admission: RE | Admit: 2014-03-23 | Discharge: 2014-03-23 | Disposition: A | Discharge status: Home / Self Care | Payer: 59 | Source: Ambulatory Visit | Attending: Physician Assistant | Admitting: Physician Assistant

## 2014-03-23 ENCOUNTER — Ambulatory Visit (HOSPITAL_BASED_OUTPATIENT_CLINIC_OR_DEPARTMENT_OTHER): Payer: 59

## 2014-03-23 DIAGNOSIS — H538 Other visual disturbances: Secondary | ICD-10-CM | POA: Insufficient documentation

## 2014-03-23 DIAGNOSIS — I959 Hypotension, unspecified: Secondary | ICD-10-CM | POA: Insufficient documentation

## 2014-03-23 DIAGNOSIS — R42 Dizziness and giddiness: Secondary | ICD-10-CM | POA: Insufficient documentation

## 2014-03-23 DIAGNOSIS — R55 Syncope and collapse: Secondary | ICD-10-CM

## 2014-03-29 ENCOUNTER — Encounter: Payer: Self-pay | Admitting: Physician Assistant

## 2014-03-29 ENCOUNTER — Ambulatory Visit (INDEPENDENT_AMBULATORY_CARE_PROVIDER_SITE_OTHER): Payer: 59 | Admitting: Physician Assistant

## 2014-03-29 VITALS — BP 124/76 | HR 65 | Temp 97.7°F | Resp 16 | Ht 64.0 in | Wt 190.0 lb

## 2014-03-29 DIAGNOSIS — G4700 Insomnia, unspecified: Secondary | ICD-10-CM

## 2014-03-29 DIAGNOSIS — F411 Generalized anxiety disorder: Secondary | ICD-10-CM

## 2014-03-29 DIAGNOSIS — G43709 Chronic migraine without aura, not intractable, without status migrainosus: Secondary | ICD-10-CM

## 2014-03-29 MED ORDER — AMITRIPTYLINE HCL 10 MG PO TABS: ORAL_TABLET | ORAL | Status: DC

## 2014-03-29 MED ORDER — KETOROLAC TROMETHAMINE 30 MG/ML IJ SOLN
30.0000 mg | Freq: Once | INTRAMUSCULAR | Status: AC
  Administered 2014-03-29: 30 mg via INTRAMUSCULAR

## 2014-03-29 MED ORDER — SUMATRIPTAN SUCCINATE 50 MG PO TABS: 50.0000 mg | ORAL_TABLET | Freq: Once | ORAL | Status: DC

## 2014-03-29 NOTE — Assessment & Plan Note (Signed)
Will wean off of Lexapro over 1 week period.  At the same time will begin Elavil 5 mg at bedtime x 1 week. Increase to 1 tablet (10 mg) nightly.  Follow-up in 2-3 weeks.

## 2014-03-29 NOTE — Progress Notes (Signed)
Patient presents to clinic today c/o continued headaches and insomnia.  Patient endorses history of sleep difficulty since starting Lexapro for Anxiety.  Also with history of chronic migraine headache. Now with daily headache.  Patient with photophobia and occasional nausea. Denies AMS or vision changes.  No lightheadedness since starting Florinef. BP within normal limits.  Workup including CT has been unremarkable for more worrisome cause of headache.  Past Medical History  Diagnosis Date  . Abnormal Pap smear 2004    colpo  . H/O candidiasis   . H/O varicella 2005  . Pelvic pain in female 2007  . Candida vaginitis 05/2006  . IBS (irritable bowel syndrome)   . 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 Depression   . Anxiety   . Panic attack   . Alcohol abuse     Current Outpatient Prescriptions on File Prior to Visit  Medication Sig Dispense Refill  . Aspirin-Salicylamide-Caffeine (BC HEADACHE POWDER PO) Take 1 packet by mouth daily as needed (for headaches).    . clonazePAM (KLONOPIN) 0.5 MG tablet Take 1 tablet (0.5 mg total) by mouth 2 (two) times daily as needed for anxiety. 60 tablet 1  . escitalopram (LEXAPRO) 20 MG tablet TAKE 1 TABLET (20 MG TOTAL) BY MOUTH DAILY. 90 tablet 1  . fludrocortisone (FLORINEF) 0.1 MG tablet 1 po qd (Patient taking differently: Take 0.2 mg by mouth daily. 1 po qd) 30 tablet 2  . JUNEL FE 1/20 1-20 MG-MCG tablet Take 1 tablet by mouth daily.     No current facility-administered medications on file prior to visit.    No Known Allergies  Family History  Problem Relation Age of Onset  . Diabetes Mother   . Cancer Sister     Lump removed frfom shoulder  . Heart disease Maternal Grandfather     MI  . Cancer Paternal Grandmother     breast    History   Social History  . Marital Status: Married    Spouse Name: N/A    Number of  Children: N/A  . Years of Education: N/A   Social History Main Topics  . Smoking status: Former Smoker -- 0.50 packs/day    Types: Cigarettes  . Smokeless tobacco: Never Used  . Alcohol Use: Yes  . Drug Use: No  . Sexual Activity:    Partners: Male    Birth Control/ Protection: Pill     Comment: loestrin fe   Other Topics Concern  . None   Social History Narrative   Lives in [place] with spouse and son age 39.   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  All other ROS are negative.  BP 124/76 mmHg  Pulse 65  Temp(Src) 97.7 F (36.5 C) (Oral)  Resp 16  Ht 5' 4"  (1.626 m)  Wt 190 lb (86.183 kg)  BMI 32.60 kg/m2  SpO2 100%  Physical Exam  Constitutional: She is oriented to person, place, and time and well-developed, well-nourished, and in no distress.  HENT:  Head: Normocephalic and atraumatic.  Right Ear: External ear normal.  Left Ear: External ear normal.  Nose: Nose normal.  Mouth/Throat: Oropharynx is clear and moist. No oropharyngeal exudate.  Eyes: Conjunctivae and EOM are normal. Pupils are equal, round, and reactive to light.  Neck: Neck supple.  Cardiovascular: Normal rate, regular  rhythm, normal heart sounds and intact distal pulses.   Pulmonary/Chest: Effort normal and breath sounds normal. No respiratory distress. She has no wheezes. She has no rales. She exhibits no tenderness.  Neurological: She is alert and oriented to person, place, and time.  Skin: Skin is warm and dry. No rash noted.  Psychiatric: Affect normal.  Vitals reviewed.   Recent Results (from the past 2160 hour(s))  Implantable device check     Status: None   Collection Time: 03/01/14  9:43 PM  Result Value Ref Range   Date Time Interrogation Session [phone]    Pulse Generator Manufacturer Med[employer]onic    Pulse Gen Model [id number] Adapta    Pulse Gen Serial Number [id number]    RV Sense Sensitivity 2.8 mV   RA Pace Amplitude 2 V   RV Pace  PulseWidth 0.46 ms   RV Pace Amplitude 2.5 V   RA Impedance 528 ohm   RA Amplitude 4 mV   RA Pacing Amplitude 1 V   RA Pacing PulseWidth 0.4 ms   RV IMPEDANCE 505 ohm   RV Amplitude 11.2 mV   RV Pacing Amplitude 0.75 V   RV Pacing PulseWidth 0.46 ms   Battery Status Unknown    Battery Longevity 98 mo   Battery Voltage 2.79 V   Battery Impedance 157 ohm   Brady AP VP Percent 6 %   Brady AS VP Percent 94 %   Brady AP VS Percent 0 %   Brady AS VS Percent 0 %   Eval Rhythm CHB (VS)    Miscellaneous Comment      Pacemaker check in clinic. Normal device function. Thresholds, sensing, impedances consistent with previous measurements. Device programmed to maximize longevity. No mode switch or high ventricular rates noted. Device programmed at appropriate safety  margins. Histogram distribution appropriate for patient activity level. Device programmed to optimize intrinsic conduction. Estimated longevity 8 years. Patient will follow up via [facility] on 3-21 and with JA in 12 months.   TSH     Status: None   Collection Time: 03/19/14  5:01 PM  Result Value Ref Range   TSH 0.844 0.350 - 4.500 uIU/mL  Hepatic function panel     Status: Abnormal   Collection Time: 03/19/14  5:01 PM  Result Value Ref Range   Total Bilirubin 0.2 0.2 - 1.2 mg/dL   Bilirubin, Direct 0.1 0.0 - 0.3 mg/dL   Indirect Bilirubin 0.1 (L) 0.2 - 1.2 mg/dL   Alkaline Phosphatase 52 39 - 117 U/L   AST 27 0 - 37 U/L   ALT 37 (H) 0 - 35 U/L   Total Protein 6.6 6.0 - 8.3 g/dL   Albumin 4.1 3.5 - 5.2 g/dL  CBC with Differential     Status: None   Collection Time: 03/19/14  5:01 PM  Result Value Ref Range   WBC 8.2 4.0 - 10.5 K/uL   RBC 4.49 3.87 - 5.11 MIL/uL   Hemoglobin 13.4 12.0 - 15.0 g/dL   HCT 40.3 36.0 - 46.0 %   MCV 89.8 78.0 - 100.0 fL   MCH 29.8 26.0 - 34.0 pg   MCHC 33.3 30.0 - 36.0 g/dL   RDW 13.3 11.5 - 15.5 %   Platelets 284 150 - 400 K/uL   MPV 9.4 8.6 - 12.4 fL    Comment: ** Please note change in  reference range(s). **   Neutrophils Relative % 54 43 - 77 %   Neutro Abs 4.4 1.7 - 7.7  K/uL   Lymphocytes Relative 38 12 - 46 %   Lymphs Abs 3.1 0.7 - 4.0 K/uL   Monocytes Relative 7 3 - 12 %   Monocytes Absolute 0.6 0.1 - 1.0 K/uL   Eosinophils Relative 1 0 - 5 %   Eosinophils Absolute 0.1 0.0 - 0.7 K/uL   Basophils Relative 0 0 - 1 %   Basophils Absolute 0.0 0.0 - 0.1 K/uL   Smear Review Criteria for review not met   Basic Metabolic Panel (BMET)     Status: Abnormal   Collection Time: 03/19/14  5:01 PM  Result Value Ref Range   Sodium 141 135 - 145 mEq/L   Potassium 4.7 3.5 - 5.3 mEq/L   Chloride 106 96 - 112 mEq/L   CO2 23 19 - 32 mEq/L   Glucose, Bld 111 (H) 70 - 99 mg/dL   BUN 10 6 - 23 mg/dL   Creat 0.76 0.50 - 1.10 mg/dL   Calcium 9.7 8.4 - 10.5 mg/dL  Cortisol     Status: None   Collection Time: 03/22/14 11:35 AM  Result Value Ref Range   Cortisol, Plasma 12.9 ug/dL    Comment: AM:  4.3 - 22.4 ug/dLPM:  3.1 - 16.7 ug/dL  POCT urinalysis dipstick     Status: None   Collection Time: 03/22/14 12:16 PM  Result Value Ref Range   Color, UA yellow    Clarity, UA clear    Glucose, UA neg    Bilirubin, UA neg    Ketones, UA neg    Spec Grav, UA >=1.030    Blood, UA trace    pH, UA 5.5    Protein, UA neg    Urobilinogen, UA 0.2    Nitrite, UA neg    Leukocytes, UA Negative   POCT urine pregnancy     Status: None   Collection Time: 03/22/14 12:51 PM  Result Value Ref Range   Preg Test, Ur Negative     Assessment/Plan: Generalized anxiety disorder Will wean off of Lexapro over 1 week period.  At the same time will begin Elavil 5 mg at bedtime x 1 week. Increase to 1 tablet (10 mg) nightly.  Follow-up in 2-3 weeks.   Insomnia Attempted multiple sleep aids in the past.  Giving migraine, anxiety and depression, and insomnia will try Elavil as it will hopefully help resolve all issues.  Will begin 5 mg nightly.  Increase to 10 mg after 1 week.   Chronic migraine  without aura without status migrainosus, not intractable Im Toradol 30 mg given by RN. Rx Imitrex for severe headache.  Will begin Elavil nightly for migraine prophylaxis.  Follow-up in 2-3 weeks.

## 2014-03-29 NOTE — Assessment & Plan Note (Signed)
Im Toradol 30 mg given by RN. Rx Imitrex for severe headache.  Will begin Elavil nightly for migraine prophylaxis.  Follow-up in 2-3 weeks.

## 2014-03-29 NOTE — Patient Instructions (Signed)
Please take 1/2 tablet of the Lexapro nightly for 1 week before stopping medication.  At the same time begin taking the Elavil 1/2 tablet at bedtime for 1 week.  Increase to 1 tablet at bedtime.  Use imitrex as directed for severe headache.   Follow-up with Dr. Laury AxonLowne in 2 weeks.

## 2014-03-29 NOTE — Progress Notes (Signed)
Pre visit review using our clinic review tool, if applicable. No additional management support is needed unless otherwise documented below in the visit note/SLS  

## 2014-03-29 NOTE — Assessment & Plan Note (Signed)
Attempted multiple sleep aids in the past.  Giving migraine, anxiety and depression, and insomnia will try Elavil as it will hopefully help resolve all issues.  Will begin 5 mg nightly.  Increase to 10 mg after 1 week.

## 2014-04-08 ENCOUNTER — Telehealth: Payer: Self-pay | Admitting: Family Medicine

## 2014-04-08 NOTE — Telephone Encounter (Signed)
Caller name:Piano, Natalia LeatherwoodKatherine Relation to ON:GEXBpt:self Call back number:[phone] Pharmacy:[facility]  Reason for call: pt saw Selena BattenCody on 1/18 and was given a rx for  SUMAtriptan (IMITREX) 50 MG tablet states it is not working at all and her nausea is bad, pt has appt on Monday with dr. Laury Axonlowne for a follow up however she states she needs something now to help with the migraines would like to know if Selena BattenCody can give her something else and something for the nausea.

## 2014-04-09 MED ORDER — ONDANSETRON 4 MG PO TBDP: 4.0000 mg | ORAL_TABLET | Freq: Three times a day (TID) | ORAL | Status: DC | PRN

## 2014-04-09 MED ORDER — RIZATRIPTAN BENZOATE 10 MG PO TABS: 10.0000 mg | ORAL_TABLET | ORAL | Status: DC | PRN

## 2014-04-09 NOTE — Telephone Encounter (Signed)
Stop Imitrex.  Maxalt sent to pharmacy.  Take as directed.  I have sent in Zofran for nausea.  Follow-up as directed.

## 2014-04-09 NOTE — Telephone Encounter (Signed)
LMOM with contact name and number for return call RE: medication request and further provider instructions; pt apologized to for not being responded to earlier as this message was originated on Thurs, 01.28.16 when myself & Selena BattenCody are not in the office [our weekly day off] and this information was forwarded to Margaretmary DysMary [Admin supervisor] d/t patient not receiving call back yesterday RE: her sickness/SLS

## 2014-04-12 ENCOUNTER — Ambulatory Visit (INDEPENDENT_AMBULATORY_CARE_PROVIDER_SITE_OTHER): Payer: 59 | Admitting: Family Medicine

## 2014-04-12 ENCOUNTER — Encounter: Payer: Self-pay | Admitting: Family Medicine

## 2014-04-12 VITALS — BP 112/79 | HR 85 | Temp 97.8°F | Wt 186.0 lb

## 2014-04-12 DIAGNOSIS — G43709 Chronic migraine without aura, not intractable, without status migrainosus: Secondary | ICD-10-CM

## 2014-04-12 DIAGNOSIS — F411 Generalized anxiety disorder: Secondary | ICD-10-CM

## 2014-04-12 DIAGNOSIS — G4700 Insomnia, unspecified: Secondary | ICD-10-CM

## 2014-04-12 DIAGNOSIS — G43109 Migraine with aura, not intractable, without status migrainosus: Secondary | ICD-10-CM

## 2014-04-12 MED ORDER — CLONAZEPAM 0.5 MG PO TABS
0.5000 mg | ORAL_TABLET | Freq: Three times a day (TID) | ORAL | Status: DC | PRN
Start: 2014-04-12 — End: 2014-04-26

## 2014-04-12 MED ORDER — KETOROLAC TROMETHAMINE 60 MG/2ML IM SOLN
60.0000 mg | Freq: Once | INTRAMUSCULAR | Status: AC
  Administered 2014-04-12: 60 mg via INTRAMUSCULAR

## 2014-04-12 MED ORDER — ESCITALOPRAM OXALATE 20 MG PO TABS: 20.0000 mg | ORAL_TABLET | Freq: Every day | ORAL | Status: DC

## 2014-04-12 NOTE — Progress Notes (Signed)
Pre visit review using our clinic review tool, if applicable. No additional management support is needed unless otherwise documented below in the visit note. 

## 2014-04-12 NOTE — Assessment & Plan Note (Signed)
Wean off elavil  Restart lexapro  Inc klonopin to tid

## 2014-04-12 NOTE — Progress Notes (Signed)
Subjective:    Patient ID: [name] [name], female    DOB: 07-04-1975, 39 y.o.   MRN: [id number]  HPI  Patient here for migraine , worsening over last 2 weeks and inc anxiety.  The elavil is not working and she is not sleeping.  She states she just feels lousy.    Past Medical History  Diagnosis Date  . Abnormal Pap smear 2004    colpo  . H/O candidiasis   . H/O varicella 2005  . Pelvic pain in female 2007  . Candida vaginitis 05/2006  . IBS (irritable bowel syndrome)   . 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 Depression   . Anxiety   . Panic attack   . Alcohol abuse     Review of Systems  Constitutional: Positive for fatigue. Negative for activity change, appetite change and unexpected weight change.  Respiratory: Negative for cough and shortness of breath.   Cardiovascular: Negative for chest pain and palpitations.  Neurological: Positive for headaches. Negative for dizziness, tremors, seizures, syncope, speech difficulty, weakness, light-headedness and numbness.  Psychiatric/Behavioral: Positive for decreased concentration. Negative for suicidal ideas, hallucinations, behavioral problems, confusion, sleep disturbance, self-injury, dysphoric mood and agitation. The patient is nervous/anxious. The patient is not hyperactive.        + insomnia       Objective:    Physical Exam  Constitutional: She is oriented to person, place, and time. She appears well-developed and well-nourished. No distress.  HENT:  Right Ear: External ear normal.  Left Ear: External ear normal.  Nose: Nose normal.  Mouth/Throat: Oropharynx is clear and moist.  Eyes: EOM are normal. Pupils are equal, round, and reactive to light.  Neck: Normal range of motion. Neck supple.  Cardiovascular: Normal rate, regular rhythm and normal heart sounds.   No murmur heard. Pulmonary/Chest: Effort  normal and breath sounds normal. No respiratory distress. She has no wheezes. She has no rales. She exhibits no tenderness.  Musculoskeletal: Normal range of motion. She exhibits no tenderness.  Neurological: She is alert and oriented to person, place, and time. She has normal strength. No cranial nerve deficit or sensory deficit.  Psychiatric: She has a normal mood and affect. Her behavior is normal. Judgment and thought content normal.    BP 112/79 mmHg  Pulse 85  Temp(Src) 97.8 F (36.6 C) (Oral)  Wt 186 lb (84.369 kg)  SpO2 100% Wt Readings from Last 3 Encounters:  04/12/14 186 lb (84.369 kg)  03/29/14 190 lb (86.183 kg)  03/22/14 186 lb 6 oz (84.539 kg)     Lab Results  Component Value Date   WBC 8.2 03/19/2014   HGB 13.4 03/19/2014   HCT 40.3 03/19/2014   PLT 284 03/19/2014   GLUCOSE 111* 03/19/2014   CHOL 135 01/14/2013   TRIG 64.0 01/14/2013   HDL 63.70 01/14/2013   LDLCALC 59 01/14/2013   ALT 37* 03/19/2014   AST 27 03/19/2014   NA 141 03/19/2014   K 4.7 03/19/2014   CL 106 03/19/2014   CREATININE 0.76 03/19/2014   BUN 10 03/19/2014   CO2 23 03/19/2014   TSH 0.844 03/19/2014   INR 0.92 05/31/2012    Ct Head Wo Contrast  03/23/2014   CLINICAL DATA:  Hypotension, dizziness, blurred vision, syncope  EXAM: CT HEAD WITHOUT CONTRAST  TECHNIQUE: Contiguous axial images were obtained from the base  of the skull through the vertex without intravenous contrast.  COMPARISON:  02/09/2013  FINDINGS: No evidence of parenchymal hemorrhage or extra-axial fluid collection. No mass lesion, mass effect, or midline shift.  No CT evidence of acute infarction.  Cerebral volume is within normal limits.  No ventriculomegaly.  The visualized paranasal sinuses are essentially clear. The mastoid air cells are unopacified.  No evidence of calvarial fracture.  IMPRESSION: Normal head CT.   Electronically Signed   By: Charline BillsSriyesh  Krishnan M.D.   On: 03/23/2014 09:46       Assessment & Plan:    Problem List Items Addressed This Visit    Insomnia   Generalized anxiety disorder - Primary   Relevant Medications   clonazePAM (KLONOPIN)  tablet   escitalopram (LEXAPRO) tablet   Chronic migraine without aura without status migrainosus, not intractable   Relevant Medications   clonazePAM (KLONOPIN)  tablet   escitalopram (LEXAPRO) tablet    Other Visit Diagnoses    Migraine with aura and without status migrainosus, not intractable        Relevant Medications    clonazePAM (KLONOPIN)  tablet    escitalopram (LEXAPRO) tablet    Other Relevant Orders    Ambulatory referral to Neurology    MR Brain Wo Contrast        Loreen FreudYvonne Lowne, DO

## 2014-04-12 NOTE — Patient Instructions (Signed)

## 2014-04-12 NOTE — Assessment & Plan Note (Signed)
Wean off elavil  Restart lexapro and inc klonopin to tid

## 2014-04-12 NOTE — Assessment & Plan Note (Signed)
Pt has not tried Maxalt yet toradol IM Refer to neuro Check MRI secondary to worsening HA

## 2014-04-14 ENCOUNTER — Telehealth: Payer: Self-pay | Admitting: *Deleted

## 2014-04-14 NOTE — Telephone Encounter (Signed)
[facility] from 03/19/14 to present. Requested records faxed to [employer] at [phone] successfully. JG//CMA

## 2014-04-22 ENCOUNTER — Telehealth: Payer: Self-pay | Admitting: *Deleted

## 2014-04-22 NOTE — Telephone Encounter (Signed)
Opened in error

## 2014-04-22 NOTE — Telephone Encounter (Signed)
[facility] stating they did not receive medical records that were faxed on 04/14/14. Records re-printed and faxed. Confirmation received. JG//CMA

## 2014-04-26 ENCOUNTER — Encounter: Payer: Self-pay | Admitting: Family Medicine

## 2014-04-26 ENCOUNTER — Other Ambulatory Visit: Payer: Self-pay | Admitting: Physician Assistant

## 2014-04-26 ENCOUNTER — Ambulatory Visit (INDEPENDENT_AMBULATORY_CARE_PROVIDER_SITE_OTHER): Payer: 59 | Admitting: Family Medicine

## 2014-04-26 VITALS — BP 114/70 | HR 87 | Temp 97.6°F | Wt 191.2 lb

## 2014-04-26 DIAGNOSIS — G43709 Chronic migraine without aura, not intractable, without status migrainosus: Secondary | ICD-10-CM

## 2014-04-26 DIAGNOSIS — F411 Generalized anxiety disorder: Secondary | ICD-10-CM

## 2014-04-26 DIAGNOSIS — G43009 Migraine without aura, not intractable, without status migrainosus: Secondary | ICD-10-CM

## 2014-04-26 DIAGNOSIS — I950 Idiopathic hypotension: Secondary | ICD-10-CM

## 2014-04-26 DIAGNOSIS — I959 Hypotension, unspecified: Secondary | ICD-10-CM | POA: Insufficient documentation

## 2014-04-26 MED ORDER — ESCITALOPRAM OXALATE 20 MG PO TABS: 20.0000 mg | ORAL_TABLET | Freq: Every day | ORAL | Status: DC

## 2014-04-26 MED ORDER — ONDANSETRON 4 MG PO TBDP: 4.0000 mg | ORAL_TABLET | Freq: Three times a day (TID) | ORAL | Status: DC | PRN

## 2014-04-26 MED ORDER — FLUDROCORTISONE ACETATE 0.1 MG PO TABS: ORAL_TABLET | ORAL | Status: DC

## 2014-04-26 MED ORDER — CLONAZEPAM 0.5 MG PO TABS: 0.5000 mg | ORAL_TABLET | Freq: Three times a day (TID) | ORAL | Status: DC | PRN

## 2014-04-26 MED ORDER — RIZATRIPTAN BENZOATE 10 MG PO TABS: ORAL_TABLET | ORAL | Status: DC

## 2014-04-26 NOTE — Progress Notes (Signed)
Subjective:    Patient ID: [name] [name], female    DOB: [birth year], 39 y.o.   MRN: [id number]  HPI  Patient here for f/u anxiety, migraines and hypotension.   Pt was recently fired from her job.  Her headaches have improved since.    Past Medical History  Diagnosis Date  . Abnormal Pap smear 2004    colpo  . H/O candidiasis   . H/O varicella 2005  . Pelvic pain in female 2007  . Candida vaginitis 05/2006  . IBS (irritable bowel syndrome)   . 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 Depression   . Anxiety   . Panic attack   . Alcohol abuse     Review of Systems  Constitutional: Negative for activity change, appetite change, fatigue and unexpected weight change.  Respiratory: Negative for cough and shortness of breath.   Cardiovascular: Negative for chest pain and palpitations.  Psychiatric/Behavioral: Negative for behavioral problems and dysphoric mood. The patient is not nervous/anxious.        Objective:    Physical Exam  Constitutional: She is oriented to person, place, and time. She appears well-developed and well-nourished. No distress.  HENT:  Right Ear: External ear normal.  Left Ear: External ear normal.  Nose: Nose normal.  Mouth/Throat: Oropharynx is clear and moist.  Eyes: EOM are normal. Pupils are equal, round, and reactive to light.  Neck: Normal range of motion. Neck supple.  Cardiovascular: Normal rate, regular rhythm and normal heart sounds.   No murmur heard. Pulmonary/Chest: Effort normal and breath sounds normal. No respiratory distress. She has no wheezes. She has no rales. She exhibits no tenderness.  Neurological: She is alert and oriented to person, place, and time.  Psychiatric: She has a normal mood and affect. Her behavior is normal. Judgment and thought content normal.    BP 114/70 mmHg  Pulse 87  Temp(Src) 97.6 F (36.4  C) (Oral)  Wt 191 lb 3.2 oz (86.728 kg)  SpO2 97% Wt Readings from Last 3 Encounters:  04/26/14 191 lb 3.2 oz (86.728 kg)  04/12/14 186 lb (84.369 kg)  03/29/14 190 lb (86.183 kg)     Lab Results  Component Value Date   WBC 8.2 03/19/2014   HGB 13.4 03/19/2014   HCT 40.3 03/19/2014   PLT 284 03/19/2014   GLUCOSE 111* 03/19/2014   CHOL 135 01/14/2013   TRIG 64.0 01/14/2013   HDL 63.70 01/14/2013   LDLCALC 59 01/14/2013   ALT 37* 03/19/2014   AST 27 03/19/2014   NA 141 03/19/2014   K 4.7 03/19/2014   CL 106 03/19/2014   CREATININE 0.76 03/19/2014   BUN 10 03/19/2014   CO2 23 03/19/2014   TSH 0.844 03/19/2014   INR 0.92 05/31/2012    Ct Head Wo Contrast  03/23/2014   CLINICAL DATA:  Hypotension, dizziness, blurred vision, syncope  EXAM: CT HEAD WITHOUT CONTRAST  TECHNIQUE: Contiguous axial images were obtained from the base of the skull through the vertex without intravenous contrast.  COMPARISON:  02/09/2013  FINDINGS: No evidence of parenchymal hemorrhage or extra-axial fluid collection. No mass lesion, mass effect, or midline shift.  No CT evidence of acute infarction.  Cerebral volume is within normal limits.  No ventriculomegaly.  The visualized paranasal sinuses are essentially clear. The mastoid air cells are unopacified.  No evidence of calvarial fracture.  IMPRESSION: Normal head CT.   Electronically Signed   By: Charline Bills M.D.   On: 03/23/2014 09:46       Assessment & Plan:   Problem List Items Addressed This Visit    Generalized anxiety disorder - Primary   Relevant Medications   clonazePAM (KLONOPIN)  tablet   escitalopram (LEXAPRO) tablet   Hypotension   Relevant Medications   fludrocortisone (FLORINEF) 0.1 MG tablet   Chronic migraine without aura without status migrainosus, not intractable    Neuro appointment pending Headaches are better since she was fired con't meds       Relevant Medications   clonazePAM (KLONOPIN)  tablet    escitalopram (LEXAPRO) tablet   rizatriptan (MAXALT) tablet    Other Visit Diagnoses    Migraine without aura and without status migrainosus, not intractable        Relevant Medications    clonazePAM (KLONOPIN)  tablet    escitalopram (LEXAPRO) tablet    ondansetron (ZOFRAN-ODT) disintegrating tablet    rizatriptan (MAXALT) tablet        Loreen Freud, DO

## 2014-04-26 NOTE — Assessment & Plan Note (Signed)
Neuro appointment pending Headaches are better since she was fired con't meds

## 2014-04-26 NOTE — Progress Notes (Signed)
Pre visit review using our clinic review tool, if applicable. No additional management support is needed unless otherwise documented below in the visit note. 

## 2014-04-26 NOTE — Telephone Encounter (Signed)
Medication Detail      Disp Refills Start End     rizatriptan (MAXALT) 10 MG tablet 10 tablet 0 04/09/2014     Sig - Route: Take 1 tablet (10 mg total) by mouth as needed for migraine. May repeat in 2 hours if needed - Oral    E-Prescribing Status: Receipt confirmed by pharmacy (04/09/2014 10:14 AM EST)     Pharmacy    [facility] - [place], [place] - [address]   Rx request to pharmacy/SLS

## 2014-04-26 NOTE — Patient Instructions (Signed)

## 2014-05-22 ENCOUNTER — Encounter (HOSPITAL_BASED_OUTPATIENT_CLINIC_OR_DEPARTMENT_OTHER): Payer: Self-pay

## 2014-05-22 ENCOUNTER — Inpatient Hospital Stay (HOSPITAL_BASED_OUTPATIENT_CLINIC_OR_DEPARTMENT_OTHER)
Admission: EM | Admit: 2014-05-22 | Discharge: 2014-05-24 | DRG: 918 | Disposition: A | Discharge status: Home / Self Care | Payer: 59 | Attending: Internal Medicine | Admitting: Internal Medicine

## 2014-05-22 DIAGNOSIS — Y92003 Bedroom of unspecified non-institutional (private) residence as the place of occurrence of the external cause: Secondary | ICD-10-CM

## 2014-05-22 DIAGNOSIS — T424X2A Poisoning by benzodiazepines, intentional self-harm, initial encounter: Secondary | ICD-10-CM | POA: Diagnosis not present

## 2014-05-22 DIAGNOSIS — K589 Irritable bowel syndrome without diarrhea: Secondary | ICD-10-CM | POA: Diagnosis present

## 2014-05-22 DIAGNOSIS — I442 Atrioventricular block, complete: Secondary | ICD-10-CM | POA: Diagnosis present

## 2014-05-22 DIAGNOSIS — I959 Hypotension, unspecified: Secondary | ICD-10-CM | POA: Diagnosis present

## 2014-05-22 DIAGNOSIS — F329 Major depressive disorder, single episode, unspecified: Secondary | ICD-10-CM | POA: Diagnosis present

## 2014-05-22 DIAGNOSIS — F410 Panic disorder [episodic paroxysmal anxiety] without agoraphobia: Secondary | ICD-10-CM | POA: Diagnosis present

## 2014-05-22 DIAGNOSIS — Z7982 Long term (current) use of aspirin: Secondary | ICD-10-CM

## 2014-05-22 DIAGNOSIS — F10129 Alcohol abuse with intoxication, unspecified: Secondary | ICD-10-CM | POA: Diagnosis present

## 2014-05-22 DIAGNOSIS — F411 Generalized anxiety disorder: Secondary | ICD-10-CM | POA: Diagnosis present

## 2014-05-22 DIAGNOSIS — Z950 Presence of cardiac pacemaker: Secondary | ICD-10-CM

## 2014-05-22 DIAGNOSIS — G4700 Insomnia, unspecified: Secondary | ICD-10-CM | POA: Diagnosis present

## 2014-05-22 DIAGNOSIS — T50902A Poisoning by unspecified drugs, medicaments and biological substances, intentional self-harm, initial encounter: Secondary | ICD-10-CM

## 2014-05-22 DIAGNOSIS — T43222A Poisoning by selective serotonin reuptake inhibitors, intentional self-harm, initial encounter: Secondary | ICD-10-CM | POA: Diagnosis present

## 2014-05-22 DIAGNOSIS — T50901A Poisoning by unspecified drugs, medicaments and biological substances, accidental (unintentional), initial encounter: Secondary | ICD-10-CM | POA: Diagnosis present

## 2014-05-22 DIAGNOSIS — Z9049 Acquired absence of other specified parts of digestive tract: Secondary | ICD-10-CM | POA: Diagnosis present

## 2014-05-22 DIAGNOSIS — Z87891 Personal history of nicotine dependence: Secondary | ICD-10-CM

## 2014-05-22 DIAGNOSIS — R45851 Suicidal ideations: Secondary | ICD-10-CM

## 2014-05-22 LAB — RAPID URINE DRUG SCREEN, HOSP PERFORMED
Amphetamines: NOT DETECTED
BARBITURATES: NOT DETECTED
Benzodiazepines: POSITIVE — AB
Cocaine: NOT DETECTED
Opiates: NOT DETECTED
TETRAHYDROCANNABINOL: NOT DETECTED

## 2014-05-22 LAB — CBC
HCT: 39.4 % (ref 36.0–46.0)
Hemoglobin: 13.2 g/dL (ref 12.0–15.0)
MCH: 30.3 pg (ref 26.0–34.0)
MCHC: 33.5 g/dL (ref 30.0–36.0)
MCV: 90.6 fL (ref 78.0–100.0)
Platelets: 300 10*3/uL (ref 150–400)
RBC: 4.35 MIL/uL (ref 3.87–5.11)
RDW: 13.1 % (ref 11.5–15.5)
WBC: 13.4 10*3/uL — ABNORMAL HIGH (ref 4.0–10.5)

## 2014-05-22 LAB — URINALYSIS, ROUTINE W REFLEX MICROSCOPIC
Bilirubin Urine: NEGATIVE
Glucose, UA: NEGATIVE mg/dL
KETONES UR: NEGATIVE mg/dL
Nitrite: NEGATIVE
Protein, ur: NEGATIVE mg/dL
Specific Gravity, Urine: 1.003 — ABNORMAL LOW (ref 1.005–1.030)
Urobilinogen, UA: 0.2 mg/dL (ref 0.0–1.0)
pH: 6 (ref 5.0–8.0)

## 2014-05-22 LAB — COMPREHENSIVE METABOLIC PANEL
ALT: 29 U/L (ref 0–35)
AST: 41 U/L — ABNORMAL HIGH (ref 0–37)
Albumin: 4.7 g/dL (ref 3.5–5.2)
Alkaline Phosphatase: 50 U/L (ref 39–117)
Anion gap: 9 (ref 5–15)
BUN: 11 mg/dL (ref 6–23)
CO2: 25 mmol/L (ref 19–32)
Calcium: 9.1 mg/dL (ref 8.4–10.5)
Chloride: 108 mmol/L (ref 96–112)
Creatinine, Ser: 0.87 mg/dL (ref 0.50–1.10)
GFR calc non Af Amer: 83 mL/min — ABNORMAL LOW (ref >90)
Glucose, Bld: 87 mg/dL (ref 70–99)
Potassium: 4.1 mmol/L (ref 3.5–5.1)
Sodium: 142 mmol/L (ref 135–145)
TOTAL PROTEIN: 7.8 g/dL (ref 6.0–8.3)
Total Bilirubin: 0.1 mg/dL — ABNORMAL LOW (ref 0.3–1.2)

## 2014-05-22 LAB — URINE MICROSCOPIC-ADD ON

## 2014-05-22 LAB — PREGNANCY, URINE: Preg Test, Ur: NEGATIVE

## 2014-05-22 LAB — ETHANOL: Alcohol, Ethyl (B): 268 mg/dL — ABNORMAL HIGH (ref 0–9)

## 2014-05-22 LAB — SALICYLATE LEVEL

## 2014-05-22 LAB — ACETAMINOPHEN LEVEL

## 2014-05-22 MED ORDER — ACTIDOSE WITH SORBITOL 50 GM/240ML PO LIQD
50.0000 g | ORAL | Status: AC
  Administered 2014-05-22: 50 g via ORAL
  Filled 2014-05-22: qty 240

## 2014-05-22 MED ORDER — SODIUM CHLORIDE 0.9 % IV SOLN
1000.0000 mL | INTRAVENOUS | Status: DC
  Administered 2014-05-22: 1000 mL via INTRAVENOUS

## 2014-05-22 NOTE — ED Provider Notes (Signed)
CSN: [id number]     Arrival date & time 05/22/14  2150 History   This chart was scribed for Arby Barrette, MD by Modena Jansky, ED Scribe. This patient was seen in room MH12/MH12 and the patient's care was started at 10:28 PM.    Chief Complaint  Patient presents with  . Drug Overdose   Patient is a 39 y.o. female presenting with Overdose. The history is provided by the patient and a friend. No language interpreter was used.  Drug Overdose This is a new problem. The current episode started 1 to 2 hours ago. The problem occurs rarely. The problem has not changed since onset.Nothing aggravates the symptoms. Nothing relieves the symptoms. She has tried nothing for the symptoms.   HPI Comments: [name] [name] is a 39 y.o. female who presents to the Emergency Department complaining of a drug overdose that occurred about an hour ago. She states that she recently got laid off from her job. She reports that she took 22.5 Xanex pills, 10 lexapro pills, and 8 clonopin pills. She states that she has some alcohol also. She reports that she has a pacemaker. She states that she has no prior hx of drug overdose. The patient reported that she intentionally took the pills because she didn't want to be here anymore. She stated that she wanted to refuse care because her intent was to die.  Past Medical History  Diagnosis Date  . Abnormal Pap smear 2004    colpo  . H/O candidiasis   . H/O varicella 2005  . Pelvic pain in female 2007  . Candida vaginitis 05/2006  . IBS (irritable bowel syndrome)   . 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 Depression   . Anxiety   . Panic attack   . Alcohol abuse    Past Surgical History  Procedure Laterality Date  . Tonsillectomy  1995  . Cholecystectomy    . Ep study and ablation  04/29/12    slow pathway ablation by Dr Johney Frame   . Pacemaker insertion   04/30/12    Medtronic Adapta L implanted by Dr Ladona Ridgel for complete heart block  . Supraventricular tachycardia ablation N/A 04/29/2012    Procedure: SUPRAVENTRICULAR TACHYCARDIA ABLATION;  Surgeon: Hillis Range, MD;  Location: [facility] CATH LAB;  Service: Cardiovascular;  Laterality: N/A;  . Permanent pacemaker insertion N/A 04/30/2012    Procedure: PERMANENT PACEMAKER INSERTION;  Surgeon: Marinus Maw, MD;  Location: [facility] CATH LAB;  Service: Cardiovascular;  Laterality: N/A;   Family History  Problem Relation Age of Onset  . Diabetes Mother   . Cancer Sister     Lump removed frfom shoulder  . Heart disease Maternal Grandfather     MI  . Cancer Paternal Grandmother     breast   History  Substance Use Topics  . Smoking status: Former Smoker -- 0.50 packs/day    Types: Cigarettes  . Smokeless tobacco: Never Used  . Alcohol Use: Yes   OB History    Gravida Para Term Preterm AB TAB SAB Ectopic Multiple Living   Review of Systems 10 Systems reviewed and all are negative for acute change except as noted in the HPI.  Allergies  Review of patient's allergies indicates no known allergies.  Home Medications   Prior to Admission  medications   Medication Sig Start Date End Date Taking? Authorizing Provider  Aspirin-Salicylamide-Caffeine (BC HEADACHE POWDER PO) Take 1 packet by mouth daily as needed (for headaches).    Historical Provider, MD  clonazePAM (KLONOPIN) 0.5 MG tablet Take 1 tablet (0.5 mg total) by mouth 3 (three) times daily as needed for anxiety. 04/26/14   Grayling CongressYvonne R Lowne, DO  escitalopram (LEXAPRO) 20 MG tablet Take 1 tablet (20 mg total) by mouth daily. 04/26/14   Lelon PerlaYvonne R Lowne, DO  fludrocortisone (FLORINEF) 0.1 MG tablet 1 po qd 04/26/14   Lelon PerlaYvonne R Lowne, DO  JUNEL FE 1/20 1-20 MG-MCG tablet Take 1 tablet by mouth daily. 09/15/12   Historical Provider, MD  ondansetron (ZOFRAN ODT) 4 MG disintegrating tablet Take 1 tablet (4 mg total) by mouth every 8 (eight)  hours as needed for nausea or vomiting. 04/26/14   Lelon PerlaYvonne R Lowne, DO  rizatriptan (MAXALT) 10 MG tablet TAKE 1 TABLET (10 MG) BY MOUTH AS NEEDED FOR MIGRAINE. MAY REPEAT ONE TIME IN 2 HOURS IF NEEDED 04/26/14   Grayling CongressYvonne R Lowne, DO   BP 101/74 mmHg  Pulse 87  Temp(Src) 97.4 F (36.3 C) (Oral)  Resp 16  Wt 191 lb (86.637 kg)  SpO2 96% Physical Exam  Constitutional:  The patient is mildly obese. She is awake and talking in slurred voice. She does not have any acute respiratory distress. She is intermittently tearful.  HENT:  Head: Normocephalic and atraumatic.  Right Ear: External ear normal.  Left Ear: External ear normal.  Nose: Nose normal.  Mouth/Throat: Oropharynx is clear and moist. No oropharyngeal exudate.  Eyes: EOM are normal. Pupils are equal, round, and reactive to light.  The patient has lateral nystagmus. Eye movements are slightly roving.  Neck: Neck supple.  Cardiovascular: Normal rate, regular rhythm, normal heart sounds and intact distal pulses.   Pulmonary/Chest: Effort normal and breath sounds normal.  Abdominal: Soft. Bowel sounds are normal. She exhibits no distension. There is no tenderness.  Musculoskeletal: Normal range of motion. She exhibits no edema or tenderness.  Neurological: She has normal strength. No cranial nerve deficit. She exhibits normal muscle tone. Coordination normal. GCS eye subscore is 4. GCS verbal subscore is 5. GCS motor subscore is 6.  The patient is awake and oriented to person and place. Her speech is slurred and slightly tangential in content. She follows commands appropriately.  Skin: Skin is warm, dry and intact.    ED Course  Procedures (including critical care time) DIAGNOSTIC STUDIES: Oxygen Saturation is 96% on RA, normal by my interpretation.    COORDINATION OF CARE: 10:32 PM- Pt advised of plan for treatment which includes labs and pt agrees.  Labs Review Labs Reviewed  URINALYSIS, ROUTINE W REFLEX MICROSCOPIC - Abnormal;  Notable for the following:    Specific Gravity, Urine 1.003 (*)    Hgb urine dipstick SMALL (*)    Leukocytes, UA TRACE (*)    All other components within normal limits  ACETAMINOPHEN LEVEL - Abnormal; Notable for the following:    Acetaminophen (Tylenol), Serum <10.0 (*)    All other components within normal limits  CBC - Abnormal; Notable for the following:    WBC 13.4 (*)    All other components within normal limits  COMPREHENSIVE METABOLIC PANEL - Abnormal; Notable for the following:    AST 41 (*)    Total Bilirubin 0.1 (*)    GFR calc non Af Amer 83 (*)    All other components within  normal limits  ETHANOL - Abnormal; Notable for the following:    Alcohol, Ethyl (B) 268 (*)    All other components within normal limits  URINE RAPID DRUG SCREEN (HOSP PERFORMED) - Abnormal; Notable for the following:    Benzodiazepines POSITIVE (*)    All other components within normal limits  PREGNANCY, URINE  SALICYLATE LEVEL  URINE MICROSCOPIC-ADD ON    Imaging Review No results found.   EKG Interpretation None     Recheck 00:15 the patient continues to remain awake and with a protected airway. MDM   Final diagnoses:  Overdose, intentional self-harm, initial encounter   The patient is brought in with a polypharmacy overdose. This was an intentional suicide overdose per my discussion with the patient. She had taken a picture of the pills that she took and the pill count was ascertained by this image. As time of her evaluation the patient is somnolent but awakens and is protecting her airway.    Arby Barrette, MD 05/23/14 [phone]

## 2014-05-22 NOTE — ED Notes (Addendum)
Pt admits to taking 20- Klonopin 0.5, 10- Lexapro 20 mg, and 20 - 25- Xanax 0.5 mg. Intially denies Suicidal Ideation but then recanted  And admits many life pressures

## 2014-05-22 NOTE — ED Notes (Addendum)
Pt reports recently got laid off from job, then starts talking about other things, intermittently quiet.  Husband reports pt took unknown amount of xanax, lexapro, klonopin has picture with multiple pills on table, 3 pills.  When asked if she did this in attempt to harm self, pt gets quiet and starts crying.  ETOH positive, slurred speech, tearful, drowsy. When assisted to restroom, pt verbalizes that "it is hard when you go in for ablation and the doctor messes something up and you have to have a pacemaker. i'm too young to have these problems".

## 2014-05-23 DIAGNOSIS — I9589 Other hypotension: Secondary | ICD-10-CM

## 2014-05-23 DIAGNOSIS — F1010 Alcohol abuse, uncomplicated: Secondary | ICD-10-CM | POA: Insufficient documentation

## 2014-05-23 DIAGNOSIS — F10129 Alcohol abuse with intoxication, unspecified: Secondary | ICD-10-CM | POA: Diagnosis present

## 2014-05-23 DIAGNOSIS — I442 Atrioventricular block, complete: Secondary | ICD-10-CM | POA: Diagnosis present

## 2014-05-23 DIAGNOSIS — Z950 Presence of cardiac pacemaker: Secondary | ICD-10-CM | POA: Diagnosis not present

## 2014-05-23 DIAGNOSIS — R45851 Suicidal ideations: Secondary | ICD-10-CM | POA: Diagnosis not present

## 2014-05-23 DIAGNOSIS — T50902A Poisoning by unspecified drugs, medicaments and biological substances, intentional self-harm, initial encounter: Secondary | ICD-10-CM | POA: Diagnosis not present

## 2014-05-23 DIAGNOSIS — Z87891 Personal history of nicotine dependence: Secondary | ICD-10-CM | POA: Diagnosis not present

## 2014-05-23 DIAGNOSIS — T424X2A Poisoning by benzodiazepines, intentional self-harm, initial encounter: Secondary | ICD-10-CM | POA: Diagnosis present

## 2014-05-23 DIAGNOSIS — Z7982 Long term (current) use of aspirin: Secondary | ICD-10-CM | POA: Diagnosis not present

## 2014-05-23 DIAGNOSIS — F411 Generalized anxiety disorder: Secondary | ICD-10-CM | POA: Diagnosis present

## 2014-05-23 DIAGNOSIS — T50901A Poisoning by unspecified drugs, medicaments and biological substances, accidental (unintentional), initial encounter: Secondary | ICD-10-CM | POA: Diagnosis present

## 2014-05-23 DIAGNOSIS — F329 Major depressive disorder, single episode, unspecified: Secondary | ICD-10-CM | POA: Diagnosis present

## 2014-05-23 DIAGNOSIS — Y92003 Bedroom of unspecified non-institutional (private) residence as the place of occurrence of the external cause: Secondary | ICD-10-CM | POA: Diagnosis not present

## 2014-05-23 DIAGNOSIS — G4700 Insomnia, unspecified: Secondary | ICD-10-CM | POA: Diagnosis present

## 2014-05-23 DIAGNOSIS — F410 Panic disorder [episodic paroxysmal anxiety] without agoraphobia: Secondary | ICD-10-CM | POA: Diagnosis present

## 2014-05-23 DIAGNOSIS — T43222A Poisoning by selective serotonin reuptake inhibitors, intentional self-harm, initial encounter: Secondary | ICD-10-CM | POA: Diagnosis present

## 2014-05-23 DIAGNOSIS — K589 Irritable bowel syndrome without diarrhea: Secondary | ICD-10-CM | POA: Diagnosis present

## 2014-05-23 DIAGNOSIS — T50902D Poisoning by unspecified drugs, medicaments and biological substances, intentional self-harm, subsequent encounter: Secondary | ICD-10-CM

## 2014-05-23 DIAGNOSIS — I959 Hypotension, unspecified: Secondary | ICD-10-CM | POA: Diagnosis present

## 2014-05-23 DIAGNOSIS — Z9049 Acquired absence of other specified parts of digestive tract: Secondary | ICD-10-CM | POA: Diagnosis present

## 2014-05-23 DIAGNOSIS — F419 Anxiety disorder, unspecified: Secondary | ICD-10-CM

## 2014-05-23 LAB — COMPREHENSIVE METABOLIC PANEL
ALBUMIN: 3.7 g/dL (ref 3.5–5.2)
ALK PHOS: 45 U/L (ref 39–117)
ALT: 25 U/L (ref 0–35)
AST: 35 U/L (ref 0–37)
Anion gap: 9 (ref 5–15)
BUN: 7 mg/dL (ref 6–23)
CALCIUM: 8.7 mg/dL (ref 8.4–10.5)
CO2: 23 mmol/L (ref 19–32)
CREATININE: 0.74 mg/dL (ref 0.50–1.10)
Chloride: 113 mmol/L — ABNORMAL HIGH (ref 96–112)
GFR calc Af Amer: >90 mL/min (ref >90)
Glucose, Bld: 89 mg/dL (ref 70–99)
POTASSIUM: 4.1 mmol/L (ref 3.5–5.1)
SODIUM: 145 mmol/L (ref 135–145)
Total Bilirubin: 0.4 mg/dL (ref 0.3–1.2)
Total Protein: 6.4 g/dL (ref 6.0–8.3)

## 2014-05-23 LAB — CBC
HEMATOCRIT: 35.2 % — AB (ref 36.0–46.0)
HEMOGLOBIN: 11.7 g/dL — AB (ref 12.0–15.0)
MCH: 29.8 pg (ref 26.0–34.0)
MCHC: 33.2 g/dL (ref 30.0–36.0)
MCV: 89.8 fL (ref 78.0–100.0)
Platelets: 249 10*3/uL (ref 150–400)
RBC: 3.92 MIL/uL (ref 3.87–5.11)
RDW: 13.7 % (ref 11.5–15.5)
WBC: 7.5 10*3/uL (ref 4.0–10.5)

## 2014-05-23 LAB — BASIC METABOLIC PANEL
Anion gap: 6 (ref 5–15)
BUN: 7 mg/dL (ref 6–23)
CHLORIDE: 110 mmol/L (ref 96–112)
CO2: 24 mmol/L (ref 19–32)
Calcium: 8.7 mg/dL (ref 8.4–10.5)
Creatinine, Ser: 0.76 mg/dL (ref 0.50–1.10)
GFR calc non Af Amer: >90 mL/min (ref >90)
GLUCOSE: 122 mg/dL — AB (ref 70–99)
Potassium: 4.1 mmol/L (ref 3.5–5.1)
SODIUM: 140 mmol/L (ref 135–145)

## 2014-05-23 LAB — MRSA PCR SCREENING: MRSA by PCR: NEGATIVE

## 2014-05-23 LAB — MAGNESIUM: MAGNESIUM: 2.1 mg/dL (ref 1.5–2.5)

## 2014-05-23 MED ORDER — THIAMINE HCL 100 MG/ML IJ SOLN
100.0000 mg | Freq: Every day | INTRAMUSCULAR | Status: DC
  Administered 2014-05-23: 100 mg via INTRAVENOUS
  Filled 2014-05-23 (×3): qty 1

## 2014-05-23 MED ORDER — THIAMINE HCL 100 MG/ML IJ SOLN
Freq: Every day | INTRAVENOUS | Status: DC
  Filled 2014-05-23: qty 1000

## 2014-05-23 MED ORDER — SODIUM CHLORIDE 0.9 % IV SOLN
1000.0000 mL | INTRAVENOUS | Status: DC
Start: 2014-05-23 — End: 2014-05-24
  Administered 2014-05-23: 1000 mL via INTRAVENOUS

## 2014-05-23 MED ORDER — SODIUM CHLORIDE 0.9 % IV SOLN: INTRAVENOUS | Status: DC

## 2014-05-23 MED ORDER — THIAMINE HCL 100 MG/ML IJ SOLN: Freq: Every day | INTRAVENOUS | Status: DC

## 2014-05-23 MED ORDER — ONDANSETRON HCL 4 MG/2ML IJ SOLN: 4.0000 mg | Freq: Four times a day (QID) | INTRAMUSCULAR | Status: DC | PRN

## 2014-05-23 MED ORDER — CETYLPYRIDINIUM CHLORIDE 0.05 % MT LIQD
7.0000 mL | Freq: Two times a day (BID) | OROMUCOSAL | Status: DC
  Administered 2014-05-23 (×2): 7 mL via OROMUCOSAL

## 2014-05-23 MED ORDER — BUPROPION HCL 75 MG PO TABS
75.0000 mg | ORAL_TABLET | ORAL | Status: DC
  Administered 2014-05-24: 75 mg via ORAL
  Filled 2014-05-23 (×2): qty 1

## 2014-05-23 MED ORDER — SODIUM CHLORIDE 0.9 % IV SOLN
INTRAVENOUS | Status: DC
  Administered 2014-05-23 (×2): via INTRAVENOUS

## 2014-05-23 MED ORDER — ONDANSETRON HCL 4 MG PO TABS: 4.0000 mg | ORAL_TABLET | Freq: Four times a day (QID) | ORAL | Status: DC | PRN

## 2014-05-23 NOTE — H&P (Addendum)
[facility] Admission History and Physical       [name] NiemannKatherine H Gwyn ZOX:[id number] DOB: 1975/10/29 DOA: 05/22/2014  Referring physician:  PCP: Loreen FreudYvonne Lowne, DO  Specialists:   Chief Complaint: Overdose  HPI: [name] [name] is a 39 y.o. female with intention overdose on multiple medications and alcohol intoxication who [facility] within 2 hours of her overdose and was administered Oral Activated Charcoal and Poison control was contacted.   [facility], and on arrival is currently is protecting her airway however very difficult to arouse and she has been upgraded to a Stepdown Bed.   Per the records on [facility] to [facility] she was alert and she stated that she took  20 Klonopin 0.5 mg tabs, 10 Lexapro 20 mg tabs, 25 Xanax 0.5 mg tabs, and she was found to have an ETOH level of 268.     Her Medical history has been obtained from her husband who is at the bedside, and he reports that 4-6 months ago she slashed her right wrist in a suicide attempt.  She has a past history of SVT S/P Failed Ablation with Complete Heart Block resulting in a Pacemaker and has severe Depression in regards to this and other stressors in her life including their marriage.       Review of Systems: Unable to Obtain from the Patient  Past Medical History  Diagnosis Date  . Abnormal Pap smear 2004    colpo  . H/O candidiasis   . H/O varicella 2005  . Pelvic pain in female 2007  . Candida vaginitis 05/2006  . IBS (irritable bowel syndrome)   . 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 Depression   . Anxiety   . Panic attack   . Alcohol abuse      Past Surgical History  Procedure Laterality Date  . Tonsillectomy  1995  . Cholecystectomy    . Ep study and ablation  04/29/12    slow pathway ablation by Dr Johney FrameAllred   . Pacemaker insertion  04/30/12     Medtronic Adapta L implanted by Dr Ladona Ridgelaylor for complete heart block  . Supraventricular tachycardia ablation N/A 04/29/2012    Procedure: SUPRAVENTRICULAR TACHYCARDIA ABLATION;  Surgeon: Hillis RangeJames Allred, MD;  [facility];  Service: Cardiovascular;  Laterality: N/A;  . Permanent pacemaker insertion N/A 04/30/2012    Procedure: PERMANENT PACEMAKER INSERTION;  Surgeon: Marinus MawGregg W Taylor, MD;  [facility];  Service: Cardiovascular;  Laterality: N/A;      Prior to Admission medications   Medication Sig Start Date End Date Taking? Authorizing Provider  Aspirin-Salicylamide-Caffeine (BC HEADACHE POWDER PO) Take 1 packet by mouth daily as needed (for headaches).    Historical Provider, MD  clonazePAM (KLONOPIN) 0.5 MG tablet Take 1 tablet (0.5 mg total) by mouth 3 (three) times daily as needed for anxiety. 04/26/14   Grayling CongressYvonne R Lowne, DO  escitalopram (LEXAPRO) 20 MG tablet Take 1 tablet (20 mg total) by mouth daily. 04/26/14   Lelon PerlaYvonne R Lowne, DO  fludrocortisone (FLORINEF) 0.1 MG tablet 1 po qd 04/26/14   Lelon PerlaYvonne R Lowne, DO  JUNEL FE 1/20 1-20 MG-MCG tablet Take 1 tablet by mouth daily. 09/15/12   Historical Provider, MD  ondansetron (ZOFRAN ODT) 4 MG disintegrating tablet Take 1 tablet (4 mg total) by mouth every 8 (  eight) hours as needed for nausea or vomiting. 04/26/14   Lelon Perla, DO  rizatriptan (MAXALT) 10 MG tablet TAKE 1 TABLET (10 MG) BY MOUTH AS NEEDED FOR MIGRAINE. MAY REPEAT ONE TIME IN 2 HOURS IF NEEDED 04/26/14   Lelon Perla, DO     No Known Allergies  Social History:  reports that she has quit smoking. Her smoking use included Cigarettes. She smoked 0.50 packs per day. She has never used smokeless tobacco. She reports that she drinks alcohol. She reports that she does not use illicit drugs.    Family History  Problem Relation Age of Onset  . Diabetes Mother   . Cancer Sister     Lump removed frfom shoulder  . Heart disease Maternal Grandfather     MI  . Cancer  Paternal Grandmother     breast       Physical Exam:  GEN:  Obtunded Obese 39 y.o. Caucasian female examined and in no acute distress; cooperative with exam Filed Vitals:   05/23/14 0045 05/23/14 0050 05/23/14 0107 05/23/14 0149  BP:   96/52 106/57  Pulse: 74 73 70 85  Temp:    98 F (36.7 C)  TempSrc:    Oral  Resp: Weight:    86.4 kg (190 lb 7.6 oz)  SpO2: 99% 100% 99% 100%   Blood pressure 106/57, pulse 85, temperature 98 F (36.7 C), temperature source Oral, resp. rate 18, weight 86.4 kg (190 lb 7.6 oz), SpO2 100 %. PSYCH: She is alert and oriented x1; Obtunded HEENT: Normocephalic and Atraumatic, Mucous membranes pink; PERRLA; EOM intact; Fundi:  Benign;  No scleral icterus, Nares: Patent, Oropharynx: Clear    Neck:  FROM, No Cervical Lymphadenopathy nor Thyromegaly or Carotid Bruit; No JVD; Breasts:: Not examined CHEST WALL: No tenderness CHEST: Normal respiration, clear to auscultation bilaterally HEART: Regular rate and rhythm; no murmurs rubs or gallops BACK: No kyphosis or scoliosis; No CVA tenderness ABDOMEN: Positive Bowel Sounds, Obese, Soft Non-Tender, No Rebound or Guarding; No Masses, No Organomegaly, No Pannus; No Intertriginous candida. Rectal Exam: Not done EXTREMITIES: No Cyanosis, Clubbing, or Edema; No Ulcerations. Genitalia: not examined PULSES: 2+ and symmetric SKIN: Normal hydration no rash or ulceration CNS:  Alert and Oriented x 1, 0,   Obtunded , Able to Move All 4 exts Vascular: pulses palpable throughout    Labs on Admission:  Basic Metabolic Panel:  Recent Labs Lab 05/22/14 2250  NA 142  K 4.1  CL 108  CO2 25  GLUCOSE 87  BUN 11  CREATININE 0.87  CALCIUM 9.1   Liver Function Tests:  Recent Labs Lab 05/22/14 2250  AST 41*  ALT 29  ALKPHOS 50  BILITOT 0.1*  PROT 7.8  ALBUMIN 4.7   No results for input(s): LIPASE, AMYLASE in the last 168 hours. No results for input(s): AMMONIA in the last 168  hours. CBC:  Recent Labs Lab 05/22/14 2250  WBC 13.4*  HGB 13.2  HCT 39.4  MCV 90.6  PLT 300   Cardiac Enzymes: No results for input(s): CKTOTAL, CKMB, CKMBINDEX, TROPONINI in the last 168 hours.  BNP (last 3 results) No results for input(s): BNP in the last 8760 hours.  ProBNP (last 3 results) No results for input(s): PROBNP in the last 8760 hours.  CBG: No results for input(s): GLUCAP in the last 168 hours.  Radiological Exams on Admission: No results found.   EKG: Independently reviewed. Ventricular paced @ 74  Assessment/Plan:  39 y.o. female with  Principal Problem:   1.    Overdose   SDU Monitoring   Suicide Precautions    1:1 Sitter   NPO   IVFs   IV Protonix  Active Problems:   2.   Alcohol abuse with intoxication   Banana Bag ordered     But holding on full institution of Benzo's      at this time due to her Overdose on Benzos      3.   Hypotension   IVFs   Monitor BPs   Monitor for Signs of Fluid Overload     4.   Suicidal ideation   Suicide Precautions   1:1 Sitter   Psych evaluation when Alert     5.   Complete heart block   Has a Pacemaker     6.   Generalized anxiety disorder   Psych consult for Ongoing Therapy     7.   DVT Prophylaxis   SCDs    Code Status:     FULL CODE        Family Communication:   Husband at Bedside    Disposition Plan:    Inpatient Status      Time spent:  60 Minutes    Ron Parker [facility] Pager [phone]   If 7AM -7PM Please Contact the Day Rounding Team MD for [facility]  If 7PM-7AM, Please Contact Night-Floor Coverage  [email or web address] Password [email or web address] 05/23/2014, 3:59 AM     ADDENDUM:   Patient was seen and examined on 05/23/2014

## 2014-05-23 NOTE — Consult Note (Signed)
[facility] Face-to-Face Psychiatry Consult   Reason for Consult: overdose Referring Physician:  Dr. Bubba Camp Patient Identification: [name] [name] MRN:  [id number] Principal Diagnosis: Overdose Diagnosis:   Patient Active Problem List   Diagnosis Date Noted  . Overdose [T50.901A] 05/23/2014  . Suicidal ideation [R45.851] 05/23/2014  . Alcohol abuse [F10.10] 05/23/2014  . Alcohol abuse with intoxication [F10.129] 05/23/2014  . Hypotension [I95.9] 04/26/2014  . Chronic migraine without aura without status migrainosus, not intractable [G43.709] 03/29/2014  . Insomnia [G47.00] 03/29/2014  . Cannot sleep [G47.00] 03/19/2014  . Generalized anxiety disorder [F41.1] 02/11/2013  . Dizziness and giddiness [R42] 02/11/2013  . Syncope [R55] 02/11/2013  . Complete heart block [I44.2] 05/19/2012  . SVT (supraventricular tachycardia) [I47.1] 04/09/2012  . Abnormal Pap smear [IMO0002]   . H/O candidiasis [Z86.19]   . H/O varicella [Z86.19]   . Pre-eclampsia [O14.90]   . IBS (irritable bowel syndrome) [K58.9]   . IRRITABLE BOWEL SYNDROME [K58.9] 11/04/2009  . NAUSEA [R11.0] 11/04/2009  . ABDOMINAL PAIN, UNSPECIFIED SITE [R10.9] 11/04/2009  . LIVER FUNCTION TESTS, ABNORMAL, HX OF [Z86.39] 11/04/2009  . HEADACHE, CHRONIC [R51] 05/16/2007  . DIARRHEA [R19.7] 05/16/2007    Total Time spent with patient: 30 minutes  Subjective:   [name] [name] is a 39 y.o. female patient admitted with overdose  HPI:  This patient is a 39 year old married white female who lives with her husband and 34 year old son in [place]. 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 The patient states that 2 years ago she went in for a cardiac ablation and it ended up not working and she had to have a pacemaker put in. She states that since then her life "totally changed." She has lost energy, has gained 40 pounds, and has been struggling with depression and anxiety.  The patient states  that prior to the pacemaker she drank every day and now she only drinks very rarely. On the day of admission she had drank about 8 beers and her blood alcohol level was 268. Apparently she took an overdose of her Klonopin Lexapro and Xanax but she doesn't remember much about it or why she did this. She does think that alcohol played a big role in diminishing her inhibitions and allowing her to make poor decisions. She adamantly denied suicidal ideation today and states that she wants to get back to her husband and son.  The patient has been receiving medication management through her primary care physician [facility] and also sees a therapist and plans to continue with them. She was on Lexapro for several months and then was recently changed to Brintellix which was stopped due to nausea. She doesn't think the Lexapro is helping that much because she still has low mood at times and poor energy and has to take medication to sleep. She might do better with adding a low-dose of Lexapro to help with energy. HPI Elements:   Location:  Global. Quality:  Severe. Severity:  Severe. Timing:  Several days. Duration:  2 years. Context:  Disappointment about health problems and job loss.  Past Medical History:  Past Medical History  Diagnosis Date  . Abnormal Pap smear 2004    colpo  . H/O candidiasis   . H/O varicella 2005  . Pelvic pain in female 2007  . Candida vaginitis 05/2006  . IBS (irritable bowel syndrome)   . 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 Depression   . Anxiety   . Panic attack   . Alcohol abuse     Past Surgical History  Procedure Laterality Date  . Tonsillectomy  1995  . Cholecystectomy    . Ep study and ablation  04/29/12    slow pathway ablation by Dr Johney Frame   . Pacemaker insertion  04/30/12    Medtronic Adapta L implanted by Dr Ladona Ridgel for complete heart  block  . Supraventricular tachycardia ablation N/A 04/29/2012    Procedure: SUPRAVENTRICULAR TACHYCARDIA ABLATION;  Surgeon: Hillis Range, MD;  Location: [facility] CATH LAB;  Service: Cardiovascular;  Laterality: N/A;  . Permanent pacemaker insertion N/A 04/30/2012    Procedure: PERMANENT PACEMAKER INSERTION;  Surgeon: Marinus Maw, MD;  Location: [facility] CATH LAB;  Service: Cardiovascular;  Laterality: N/A;   Family History:  Family History  Problem Relation Age of Onset  . Diabetes Mother   . Cancer Sister     Lump removed frfom shoulder  . Heart disease Maternal Grandfather     MI  . Cancer Paternal Grandmother     breast   Social History:  History  Alcohol Use  . Yes     History  Drug Use No    History   Social History  . Marital Status: Married    Spouse Name: N/A  . Number of Children: N/A  . Years of Education: N/A   Social History Main Topics  . Smoking status: Former Smoker -- 0.50 packs/day    Types: Cigarettes  . Smokeless tobacco: Never Used  . Alcohol Use: Yes  . Drug Use: No  . Sexual Activity:    Partners: Male    Birth Control/ Protection: Pill     Comment: loestrin fe   Other Topics Concern  . None   Social History Narrative   Lives in [place] with spouse and son age 90.   Transport planner for an Archivist Social History:                          Allergies:  No Known Allergies  Vitals: Blood pressure 100/59, pulse 79, temperature 98.4 F (36.9 C), temperature source Oral, resp. rate 21, height  (1.626 m), weight 191 lb 12.8 oz (87 kg), SpO2 96 %.  Risk to Self: Is patient at risk for suicide?: Yes Risk to Others:   Prior Inpatient Therapy:   Prior Outpatient Therapy:    Current Facility-Administered Medications  Medication Dose Route Frequency Provider Last Rate Last Dose  . 0.9 %  sodium chloride infusion   Intravenous Continuous Ron Parker, MD 100 mL/hr at 05/23/14 1200    . 0.9 %  sodium chloride  infusion  1,000 mL Intravenous Continuous Osvaldo Shipper, MD 100 mL/hr at 05/23/14 0809 1,000 mL at 05/23/14 0809  . antiseptic oral rinse (CPC / CETYLPYRIDINIUM CHLORIDE 0.05%) solution 7 mL  7 mL Mouth Rinse BID Ron Parker, MD   7 mL at 05/23/14 1000  . ondansetron (ZOFRAN) tablet 4 mg  4 mg Oral Q6H PRN Ron Parker, MD       Or  . ondansetron (ZOFRAN) injection 4 mg  4 mg Intravenous Q6H PRN Ron Parker, MD      . thiamine (B-1) injection 100 mg  100 mg Intravenous Daily Osvaldo Shipper, MD   100 mg at 05/23/14 1229  Musculoskeletal: Strength & Muscle Tone: within normal limits Gait & Station: normal Patient leans: N/A  Psychiatric Specialty Exam: Physical Exam  Review of Systems  Psychiatric/Behavioral: Positive for depression and suicidal ideas. The patient is nervous/anxious and has insomnia.     Blood pressure 100/59, pulse 79, temperature 98.4 F (36.9 C), temperature source Oral, resp. rate 21, height 5\' 4"  (1.626 m), weight 191 lb 12.8 oz (87 kg), SpO2 96 %.Body mass index is 32.91 kg/(m^2).  General Appearance: Casual and Fairly Groomed  Patent attorneyye Contact::  Good  Speech:  Clear and Coherent  Volume:  Normal  Mood:  Dysphoric  Affect:  Congruent  Thought Process:  Goal Directed  Orientation:  Full (Time, Place, and Person)  Thought Content:  Rumination  Suicidal Thoughts:  No  Homicidal Thoughts:  No  Memory:  Immediate;   Good Recent;   Good Remote;   Good  Judgement:  Fair  Insight:  Fair  Psychomotor Activity:  Normal  Concentration:  Good  Recall:  Good  Fund of Knowledge:Good  Language: Good  Akathisia:  No  Handed:  Right  AIMS (if indicated):     Assets:  Communication Skills Desire for Improvement Resilience Social Support Talents/Skills  ADL's:  Intact  Cognition: WNL  Sleep:      Medical Decision Making: Established Problem, Worsening (2), Review or order medicine tests (1), Review of Medication Regimen & Side Effects (2) and  Review of New Medication or Change in Dosage (2)  Treatment Plan Summary: Daily contact with patient to assess and evaluate symptoms and progress in treatment and Medication management  Plan:  No evidence of imminent risk to self or others at present.   Patient does not meet criteria for psychiatric inpatient admission. Supportive therapy provided about ongoing stressors. Discussed crisis plan, support from social network, calling 911, coming to the Emergency Department, and calling Suicide Hotline. Disposition: We'll add low-dose Wellbutrin to her regimen. I strongly she suggested she get a referral to psychiatry from her primary doctor. At this point she denies suicidal ideation and is clear and coherent and will not meet criteria for inpatient admission  ROSS, [facility] LPDEBORAH 05/23/2014 5:01 PM

## 2014-05-23 NOTE — Progress Notes (Signed)
[facility] PROGRESS NOTE  [name] NiemannKatherine H [name] ZOX:[id number] DOB: 08/18/1975 DOA: 05/22/2014  PCP: [name] FreudYvonne Lowne, DO  Brief HPI: 39 year old Caucasian female with a past medical history of pacemaker placement for complete heart block as a complication of ablation procedure, history of anxiety and depression who was brought into the emergency department 2 hours after she ingested 20 tablets of Klonopin, 10 tablets of Lexapro, 25 tablets of Xanax. She had also consumes significant amount of beer yesterday. This was an intent to hurt herself. She apparently had also tried to slash her wrist in a suicide attempt about 6 months ago. She was admitted to the hospital for further management  Past medical history:  Past Medical History  Diagnosis Date  . Abnormal Pap smear 2004    colpo  . H/O candidiasis   . H/O varicella 2005  . Pelvic pain in female 2007  . Candida vaginitis 05/2006  . IBS (irritable bowel syndrome)   . 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 Depression   . Anxiety   . Panic attack   . Alcohol abuse     Consultants: Psychiatry  Procedures: None  Antibiotics: None  Subjective: Patient denies any nausea, vomiting this morning. Some abdominal discomfort. Had a loose stool this morning. No shortness of breath. No lightheadedness. She wants to go home.  Objective: Vital Signs  Filed Vitals:   05/23/14 0107 05/23/14 0149 05/23/14 0506 05/23/14 0539  BP: 96/52 106/57 101/54 100/58  Pulse: 70 85 94 78  Temp:  98 F (36.7 C) 98 F (36.7 C) 98.5 F (36.9 C)  TempSrc:  Oral Oral Oral  Resp: 18 18 16 20   Height:    5\' 4"  (1.626 m)  Weight:  86.4 kg (190 lb 7.6 oz)  87 kg (191 lb 12.8 oz)  SpO2: 99% 100% 97% 99%    Intake/Output Summary (Last 24 hours) at 05/23/14 0745 Last data filed at 05/23/14 0700  Gross per 24 hour  Intake 1037.91 ml    Output      0 ml  Net 1037.91 ml   Filed Weights   05/22/14 2158 05/23/14 0149 05/23/14 0539  Weight: 86.637 kg (191 lb) 86.4 kg (190 lb 7.6 oz) 87 kg (191 lb 12.8 oz)    General appearance: alert, cooperative, appears stated age and no distress Resp: clear to auscultation bilaterally Cardio: regular rate and rhythm, S1, S2 normal, no murmur, click, rub or gallop GI: soft, non-tender; bowel sounds normal; no masses,  no organomegaly Extremities: extremities normal, atraumatic, no cyanosis or edema Neurologic: Alert and oriented 3. Cranial nerves II-12 intact. No other focal neurological deficits noted.  Lab Results:  Basic Metabolic Panel:  Recent Labs Lab 05/22/14 2250  NA 142  K 4.1  CL 108  CO2 25  GLUCOSE 87  BUN 11  CREATININE 0.87  CALCIUM 9.1   Liver Function Tests:  Recent Labs Lab 05/22/14 2250  AST 41*  ALT 29  ALKPHOS 50  BILITOT 0.1*  PROT 7.8  ALBUMIN 4.7   CBC:  Recent Labs Lab 05/22/14 2250  WBC 13.4*  HGB 13.2  HCT 39.4  MCV 90.6  PLT 300     Studies/Results: No results found.  Medications:  Scheduled: . antiseptic oral rinse  7 mL Mouth Rinse BID  . thiamine IV  100 mg Intravenous Daily   Continuous: . sodium  chloride 100 mL/hr at 05/23/14 0601  . sodium chloride 1,000 mL (05/23/14 0809)   ZOX:[id number] **OR** ondansetron (ZOFRAN) IV  Assessment/Plan:  Principal Problem:   Overdose Active Problems:   Complete heart block   Generalized anxiety disorder   Hypotension   Suicidal ideation   Alcohol abuse with intoxication    Intentional drug overdose Overdose with multiple agents This was an intent to harm herself. Continue suicide precautions. Continue to monitor in stepdown on telemetry. Blood pressure is on the lower side but stable. She remains asymptomatic. Supportive treatment will be provided. Continue EKG monitoring.  Alcohol abuse with intoxication She denies drinking alcohol on a daily basis. Continue  IV fluids. Continue thiamine. Monitor closely for signs and symptoms of withdrawal though she remains at low risk for same.  Hypotension Secondary to drug overdose. Supportive treatment. IV fluids.  History of postprocedure complete heart block status post pacemaker Stable. EKG shows paced rhythm  History of depression and Generalized anxiety disorder Will need psychiatry input. Hold off on pharmacotherapy for now.  DVT Prophylaxis: SCDs    Code Status: Full code  Family Communication: Discussed with the patient and her husband  Disposition Plan: Patient wants to leave the hospital. She refuses to stay despite being told the reason for the same. She'll be involuntarily committed. Psychiatry has been contacted. Continue sitter for now. Continue stepdown status for now.    LOS: 0 days   [facility]  [facility] Pager [phone] 05/23/2014, 7:45 AM  If 7PM-7AM, please contact night-coverage at [email or web address], password [facility]

## 2014-05-23 NOTE — ED Notes (Signed)
I made nurse Channin aware of BP.
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
## 2014-05-23 NOTE — Plan of Care (Signed)
Suicide attempt: OD on 20 klonopin 0.5, 10 lexapro 20mg , 25 xanax 0.5, etoh level of 268.  QTc prolonged at 535.  Tele obs.

## 2014-05-23 NOTE — Progress Notes (Addendum)
CSW completed request for IVC for patient that has two suicide attempts in the last month.  Patient took intentional overdose of multiple medications while consuming alcohol.  IVC paperwork signed by Attending and [facility]. Magistrate called to report IVC has been accepted and [employer] will serve commitment.  [facility] verify [facility] was on psych consult list.  CSW added patient to TTS Shift report.  [facility] Laira Penninger Macy MisLCSW,LCAS [place] ED CSW [phone]

## 2014-05-24 LAB — CBC
HEMATOCRIT: 36.2 % (ref 36.0–46.0)
Hemoglobin: 11.7 g/dL — ABNORMAL LOW (ref 12.0–15.0)
MCH: 29.8 pg (ref 26.0–34.0)
MCHC: 32.3 g/dL (ref 30.0–36.0)
MCV: 92.3 fL (ref 78.0–100.0)
PLATELETS: 232 10*3/uL (ref 150–400)
RBC: 3.92 MIL/uL (ref 3.87–5.11)
RDW: 13.6 % (ref 11.5–15.5)
WBC: 9 10*3/uL (ref 4.0–10.5)

## 2014-05-24 LAB — COMPREHENSIVE METABOLIC PANEL
ALK PHOS: 38 U/L — AB (ref 39–117)
ALT: 19 U/L (ref 0–35)
AST: 25 U/L (ref 0–37)
Albumin: 3.1 g/dL — ABNORMAL LOW (ref 3.5–5.2)
Anion gap: 7 (ref 5–15)
BUN: 7 mg/dL (ref 6–23)
CHLORIDE: 108 mmol/L (ref 96–112)
CO2: 24 mmol/L (ref 19–32)
CREATININE: 0.81 mg/dL (ref 0.50–1.10)
Calcium: 8.6 mg/dL (ref 8.4–10.5)
GFR calc Af Amer: >90 mL/min (ref >90)
GFR calc non Af Amer: >90 mL/min (ref >90)
GLUCOSE: 102 mg/dL — AB (ref 70–99)
POTASSIUM: 4.7 mmol/L (ref 3.5–5.1)
SODIUM: 139 mmol/L (ref 135–145)
TOTAL PROTEIN: 5.5 g/dL — AB (ref 6.0–8.3)
Total Bilirubin: 0.2 mg/dL — ABNORMAL LOW (ref 0.3–1.2)

## 2014-05-24 MED ORDER — THERA VITAL M PO TABS: 1.0000 | ORAL_TABLET | Freq: Every day | ORAL | Status: DC

## 2014-05-24 MED ORDER — BUPROPION HCL 75 MG PO TABS: 75.0000 mg | ORAL_TABLET | ORAL | Status: DC

## 2014-05-24 NOTE — Progress Notes (Signed)
Chaplain notified to visit pt via consult.   Ms. Mickle MalloryDonnell noted that she didn't need a Chaplain. She is expecting a Child psychotherapistsocial worker so she can be discharged, something she is really cheery about.   Sitter and gentleman bedside.   Chaplain signing off.   Gala RomneyBrown, Pebble Botkin J, Chaplain 05/24/2014 1:32 PM Page if needed

## 2014-05-24 NOTE — Discharge Instructions (Signed)
Alcohol Intoxication Alcohol intoxication occurs when the amount of alcohol that a person has consumed impairs his or her ability to mentally and physically function. Alcohol directly impairs the normal chemical activity of the brain. Drinking large amounts of alcohol can lead to changes in mental function and behavior, and it can cause many physical effects that can be harmful.  Alcohol intoxication can range in severity from mild to very severe. Various factors can affect the level of intoxication that occurs, such as the person's age, gender, weight, frequency of alcohol consumption, and the presence of other medical conditions (such as diabetes, seizures, or heart conditions). Dangerous levels of alcohol intoxication may occur when people drink large amounts of alcohol in a short period (binge drinking). Alcohol can also be especially dangerous when combined with certain prescription medicines or "recreational" drugs. SIGNS AND SYMPTOMS Some common signs and symptoms of mild alcohol intoxication include: 1. Loss of coordination. 2. Changes in mood and behavior. 3. Impaired judgment. 4. Slurred speech. As alcohol intoxication progresses to more severe levels, other signs and symptoms will appear. These may include:  Vomiting.  Confusion and impaired memory.  Slowed breathing.  Seizures.  Loss of consciousness. DIAGNOSIS  Your health care provider will take a medical history and perform a physical exam. You will be asked about the amount and type of alcohol you have consumed. Blood tests will be done to measure the concentration of alcohol in your blood. In many places, your blood alcohol level must be lower than 80 mg/dL (1.61%) to legally drive. However, many dangerous effects of alcohol can occur at much lower levels.  TREATMENT  People with alcohol intoxication often do not require treatment. Most of the effects of alcohol intoxication are temporary, and they go away as the alcohol  naturally leaves the body. Your health care provider will monitor your condition until you are stable enough to go home. Fluids are sometimes given through an IV access tube to help prevent dehydration.  HOME CARE INSTRUCTIONS  Do not drive after drinking alcohol.  Stay hydrated. Drink enough water and fluids to keep your urine clear or pale yellow. Avoid caffeine.   Only take over-the-counter or prescription medicines as directed by your health care provider.  SEEK MEDICAL CARE IF:   You have persistent vomiting.   You do not feel better after a few days.  You have frequent alcohol intoxication. Your health care provider can help determine if you should see a substance use treatment counselor. SEEK IMMEDIATE MEDICAL CARE IF:   You become shaky or tremble when you try to stop drinking.   You shake uncontrollably (seizure).   You throw up (vomit) blood. This may be bright red or may look like black coffee grounds.   You have blood in your stool. This may be bright red or may appear as a black, tarry, bad smelling stool.   You become lightheaded or faint.  MAKE SURE YOU:   Understand these instructions.  Will watch your condition.  Will get help right away if you are not doing well or get worse. Document Released: 12/06/2004 Document Revised: 10/29/2012 Document Reviewed: 08/01/2012 [facility] Patient Information 2015 [place], [place]. This information is not intended to replace advice given to you by your health care provider. Make sure you discuss any questions you have with your health care provider.   Depression Depression refers to feeling sad, low, down in the dumps, blue, gloomy, or empty. In general, there are two kinds of depression: 5. Normal  sadness or normal grief. This kind of depression is one that we all feel from time to time after upsetting life experiences, such as the loss of a job or the ending of a relationship. This kind of depression is considered  normal, is short lived, and resolves within a few days to 2 weeks. Depression experienced after the loss of a loved one (bereavement) often lasts longer than 2 weeks but normally gets better with time. 6. Clinical depression. This kind of depression lasts longer than normal sadness or normal grief or interferes with your ability to function at home, at work, and in school. It also interferes with your personal relationships. It affects almost every aspect of your life. Clinical depression is an illness. Symptoms of depression can also be caused by conditions other than those mentioned above, such as:  Physical illness. Some physical illnesses, including underactive thyroid gland (hypothyroidism), severe anemia, specific types of cancer, diabetes, uncontrolled seizures, heart and lung problems, strokes, and chronic pain are commonly associated with symptoms of depression.  Side effects of some prescription medicine. In some people, certain types of medicine can cause symptoms of depression.  Substance abuse. Abuse of alcohol and illicit drugs can cause symptoms of depression. SYMPTOMS Symptoms of normal sadness and normal grief include the following:  Feeling sad or crying for short periods of time.  Not caring about anything (apathy).  Difficulty sleeping or sleeping too much.  No longer able to enjoy the things you used to enjoy.  Desire to be by oneself all the time (social isolation).  Lack of energy or motivation.  Difficulty concentrating or remembering.  Change in appetite or weight.  Restlessness or agitation. Symptoms of clinical depression include the same symptoms of normal sadness or normal grief and also the following symptoms:  Feeling sad or crying all the time.  Feelings of guilt or worthlessness.  Feelings of hopelessness or helplessness.  Thoughts of suicide or the desire to harm yourself (suicidal ideation).  Loss of touch with reality (psychotic symptoms).  Seeing or hearing things that are not real (hallucinations) or having false beliefs about your life or the people around you (delusions and paranoia). DIAGNOSIS  The diagnosis of clinical depression is usually based on how bad the symptoms are and how long they have lasted. Your health care provider will also ask you questions about your medical history and substance use to find out if physical illness, use of prescription medicine, or substance abuse is causing your depression. Your health care provider may also order blood tests. TREATMENT  Often, normal sadness and normal grief do not require treatment. However, sometimes antidepressant medicine is given for bereavement to ease the depressive symptoms until they resolve. The treatment for clinical depression depends on how bad the symptoms are but often includes antidepressant medicine, counseling with a mental health professional, or both. Your health care provider will help to determine what treatment is best for you. Depression caused by physical illness usually goes away with appropriate medical treatment of the illness. If prescription medicine is causing depression, talk with your health care provider about stopping the medicine, decreasing the dose, or changing to another medicine. Depression caused by the abuse of alcohol or illicit drugs goes away when you stop using these substances. Some adults need professional help in order to stop drinking or using drugs. SEEK IMMEDIATE MEDICAL CARE IF:  You have thoughts about hurting yourself or others.  You lose touch with reality (have psychotic symptoms).  You are taking  medicine for depression and have a serious side effect. FOR MORE INFORMATION  National Alliance on Mental Illness: www.nami.[employer]org  National Institute of Mental Health: http://www.maynard.net/www.nimh.nih.gov Document Released: 02/24/2000 Document Revised: 07/13/2013 Document Reviewed: 05/28/2011 [facility] Patient Information 2015 [place].  This information is not intended to replace advice given to you by your health care provider. Make sure you discuss any questions you have with your health care provider.

## 2014-05-24 NOTE — Clinical Social Work Psych Note (Signed)
IVC paperwork rescinded and faxed to magistrate on 05/24/2014.  Marcelline Deistmily Jaliyah Fotheringham, LCSWA [phone]) Licensed Clinical Social Worker Orthopedics [phone]([id number]) and Surgical [phone]([id number])

## 2014-05-24 NOTE — Clinical Social Work Note (Signed)
Unit CSW informed by bedside RN, that the pt will be discharge today. CSW informed the covering psych social worker regarding resending IVC paperwork. Unit CSW will sign off.   Fady Stamps, MSW, LCSWA [phone]

## 2014-05-24 NOTE — Care Management Note (Unsigned)
    Page 1 of 1   05/24/2014     2:04:32 PM CARE MANAGEMENT NOTE 05/24/2014  Patient:  [name] [name],[name] [name]   Account Number:  [id number]  Date Initiated:  05/24/2014  Documentation initiated by:  Gae GallopOLE,ANGELA  Subjective/Objective Assessment:   Pt from home admitted for overdose.     Action/Plan:   Return to home when medically stable. CM to f/u with d/c needs.   Anticipated DC Date:  05/24/2014   Anticipated DC Plan:           Choice offered to / List presented to:             Status of service:  Completed, signed off Medicare Important Message given?  NO (If response is "NO", the following Medicare IM given date fields will be blank) Date Medicare IM given:   Medicare IM given by:   Date Additional Medicare IM given:   Additional Medicare IM given by:    Discharge Disposition:    Per UR Regulation:  Reviewed for med. necessity/level of care/duration of stay  If discussed at Long Length of Stay Meetings, dates discussed:    Comments:

## 2014-05-24 NOTE — Discharge Summary (Signed)
[facility]  Physician Discharge Summary   Patient ID: [name] NiemannKatherine H Mantia MRN: [id number] DOB/AGE: 1975-11-21 39 y.o.  Admit date: 05/22/2014 Discharge date: 05/24/2014  PCP: Loreen FreudYvonne Lowne, DO  DISCHARGE DIAGNOSES:  Principal Problem:   Overdose Active Problems:   Complete heart block   Generalized anxiety disorder   Hypotension   Suicidal ideation   Alcohol abuse with intoxication   RECOMMENDATIONS FOR OUTPATIENT FOLLOW UP: 1. Patient has been asked to follow up with her psychologist this week. She will need to get a referral to see a psychiatrist.   DISCHARGE CONDITION: fair  Diet recommendation: Regular  Filed Weights   05/22/14 2158 05/23/14 0149 05/23/14 0539  Weight: 86.637 kg (191 lb) 86.4 kg (190 lb 7.6 oz) 87 kg (191 lb 12.8 oz)    INITIAL HISTORY: 39 year old Caucasian female with a past medical history of pacemaker placement for complete heart block as a complication of ablation procedure, history of anxiety and depression who was brought into the emergency department 2 hours after she ingested 20 tablets of Klonopin, 10 tablets of Lexapro, 25 tablets of Xanax. She had also consumes significant amount of beer yesterday. This was an intent to hurt herself. She apparently had also tried to slash her wrist in a suicide attempt about 6 months ago. She was admitted to the hospital for further management. She had to be involuntarily committed.  Consultations:  Psychiatry   HOSPITAL COURSE:   Intentional drug overdose with multiple agents This was an intent to harm herself. Patient had been under a lot of stress. She was admitted to the hospital and placed on suicide precautions. He was admitted to stepdown unit for close monitoring. She was noted to be mildly hypotensive. She was given fluids. Supportive treatment was provided. EKG shows mildly prolonged QTC. She remained stable. Blood pressure improved. Mentation improved. She was seen by psychiatrist. She  was not considered to be a threat to herself or others. She was cleared by the psychiatrist. Patient promises to follow up with her psychologist and get referral for a psychiatrist.   Alcohol abuse with intoxication She denies drinking alcohol on a daily basis, but admitted to binge drinking the day before she came into the hospital. There were no signs or symptoms of withdrawal. She was given thiamine.  Hypotension This was secondary to drug overdose. Supportive treatment was provided. Blood pressure stabilized. She was ambulated in the hallways without difficulties.  History of postprocedure complete heart block status post pacemaker Stable. EKG shows paced rhythm  History of depression and Generalized anxiety disorder Seen by psychiatrist. Delene Lollhey've added Wellbutrin to her other medication regimen. She has been asked to follow-up with her outpatient providers.  Patient has been cleared by psychiatry. She is medically stable. She can be discharged home today.   PERTINENT LABS:  The results of significant diagnostics from this hospitalization (including imaging, microbiology, ancillary and laboratory) are listed below for reference.    Microbiology: Recent Results (from the past 240 hour(s))  MRSA PCR Screening     Status: None   Collection Time: 05/23/14  5:30 AM  Result Value Ref Range Status   MRSA by PCR NEGATIVE NEGATIVE Final    Comment:        The GeneXpert MRSA Assay (FDA approved for NASAL specimens only), is one component of a comprehensive MRSA colonization surveillance program. It is not intended to diagnose MRSA infection nor to guide or monitor treatment for MRSA infections.      Labs: Basic Metabolic  Panel:  Recent Labs Lab 05/22/14 2250 05/23/14 0731 05/23/14 1831 05/24/14 0346  NA 142 145 140 139  K 4.1 4.1 4.1 4.7  CL 108 113* 110 108  CO2 GLUCOSE 87 89 122* 102*  BUN CREATININE 0.87 0.74 0.76 0.81  CALCIUM 9.1 8.7 8.7  8.6  MG  --  2.1  --   --    Liver Function Tests:  Recent Labs Lab 05/22/14 2250 05/23/14 0731 05/24/14 0346  AST 41* 35 25  ALT ALKPHOS 50 45 38*  BILITOT 0.1* 0.4 0.2*  PROT 7.8 6.4 5.5*  ALBUMIN 4.7 3.7 3.1*   CBC:  Recent Labs Lab 05/22/14 2250 05/23/14 0731 05/24/14 0346  WBC 13.4* 7.5 9.0  HGB 13.2 11.7* 11.7*  HCT 39.4 35.2* 36.2  MCV 90.6 89.8 92.3  PLT 300 249 232    DISCHARGE EXAMINATION: Filed Vitals:   05/24/14 0058 05/24/14 0346 05/24/14 0800 05/24/14 1126  BP: 109/61 130/81 100/59 110/67  Pulse: 64 69 60   Temp: 98 F (36.7 C) 97.9 F (36.6 C) 98.3 F (36.8 C) 98.6 F (37 C)  TempSrc: Oral Oral Oral Oral  Resp: Height:      Weight:      SpO2: 99% 98% 100% 100%   General appearance: alert, cooperative, appears stated age and no distress Resp: clear to auscultation bilaterally Cardio: regular rate and rhythm, S1, S2 normal, no murmur, click, rub or gallop GI: soft, non-tender; bowel sounds normal; no masses,  no organomegaly Extremities: extremities normal, atraumatic, no cyanosis or edema  DISPOSITION: Home with parents  Discharge Instructions    Call MD for:  difficulty breathing, headache or visual disturbances    Complete by:  As directed      Call MD for:  extreme fatigue    Complete by:  As directed      Call MD for:  persistant dizziness or light-headedness    Complete by:  As directed      Call MD for:  persistant nausea and vomiting    Complete by:  As directed      Call MD for:  severe uncontrolled pain    Complete by:  As directed      Diet - low sodium heart healthy    Complete by:  As directed      Discharge instructions    Complete by:  As directed   Please see your Psychologist ASAP to get referred to a Psychiatrist.     Increase activity slowly    Complete by:  As directed            ALLERGIES: No Known Allergies   Discharge Medication List as of 05/24/2014 12:36 PM    START taking  these medications   Details  buPROPion (WELLBUTRIN) 75 MG tablet Take 1 tablet (75 mg total) by mouth every morning., Starting 05/24/2014, Until Discontinued, Print    Multiple Vitamins-Minerals (MULTIVITAMIN) tablet Take 1 tablet by mouth daily., Starting 05/24/2014, Until Discontinued, Print      CONTINUE these medications which have NOT CHANGED   Details  Aspirin-Salicylamide-Caffeine (BC HEADACHE POWDER PO) Take 1 packet by mouth daily as needed (for headaches)., Until Discontinued, Historical Med    clonazePAM (KLONOPIN) 0.5 MG tablet Take 1 tablet (0.5 mg total) by mouth 3 (three) times daily as needed for anxiety., Starting 04/26/2014, Until Discontinued, Print    JUNEL FE  1/20 1-20 MG-MCG tablet Take 1 tablet by mouth daily., Starting 09/15/2012, Until Discontinued, Historical Med    escitalopram (LEXAPRO) 20 MG tablet Take 1 tablet (20 mg total) by mouth daily., Starting 04/26/2014, Until Discontinued, Normal    fludrocortisone (FLORINEF) 0.1 MG tablet 1 po qd, Normal    ondansetron (ZOFRAN ODT) 4 MG disintegrating tablet Take 1 tablet (4 mg total) by mouth every 8 (eight) hours as needed for nausea or vomiting., Starting 04/26/2014, Until Discontinued, Normal    rizatriptan (MAXALT) 10 MG tablet TAKE 1 TABLET (10 MG) BY MOUTH AS NEEDED FOR MIGRAINE. MAY REPEAT ONE TIME IN 2 HOURS IF NEEDED, Normal       Follow-up Information    Follow up with Loreen Freud, DO. Schedule an appointment as soon as possible for a visit in 1 week.   Specialty:  Family Medicine   Why:  post hospitalization follow up   Contact information:   [address] [place] [address] [phone]       Follow up with Your Psychologist.   Why:  see sometime this week for follow up and to get referral to a Psychiatrist      TOTAL DISCHARGE TIME: 35 mins  [facility]  [facility] Pager [phone]  05/24/2014, 3:45 PM

## 2014-05-25 NOTE — Progress Notes (Signed)
Pt d/c home per MD order,pt VSS, pt tol well, pt verbalized understanding of d/c, pt educated on SA precautions, d/c instruction and scripts given, all questions answered

## 2014-05-26 ENCOUNTER — Telehealth: Payer: Self-pay | Admitting: Neurology

## 2014-05-26 NOTE — Telephone Encounter (Signed)
Pt canceled her new patient appt to see Dr Everlena CooperJaffe on 05-31-14 and i notified the referring Dr by Josefina Doepic

## 2014-05-31 ENCOUNTER — Ambulatory Visit: Payer: Self-pay | Admitting: Neurology

## 2014-06-01 ENCOUNTER — Ambulatory Visit (INDEPENDENT_AMBULATORY_CARE_PROVIDER_SITE_OTHER): Payer: 59 | Admitting: *Deleted

## 2014-06-01 ENCOUNTER — Telehealth: Payer: Self-pay | Admitting: Cardiology

## 2014-06-01 DIAGNOSIS — I442 Atrioventricular block, complete: Secondary | ICD-10-CM

## 2014-06-01 LAB — MDC_IDC_ENUM_SESS_TYPE_REMOTE
Battery Impedance: 205 Ohm
Battery Voltage: 2.79 V
Brady Statistic AP VP Percent: 10 %
Brady Statistic AS VP Percent: 90 %
Date Time Interrogation Session: 20160322175734
Lead Channel Impedance Value: 533 Ohm
Lead Channel Impedance Value: 589 Ohm
Lead Channel Pacing Threshold Amplitude: 0.5 V
Lead Channel Pacing Threshold Pulse Width: 0.4 ms
Lead Channel Sensing Intrinsic Amplitude: 2.8 mV
Lead Channel Setting Pacing Amplitude: 2 V
Lead Channel Setting Pacing Amplitude: 2.5 V
Lead Channel Setting Pacing Pulse Width: 0.46 ms
MDC IDC MSMT BATTERY REMAINING LONGEVITY: 111 mo
MDC IDC MSMT LEADCHNL RA PACING THRESHOLD AMPLITUDE: 0.875 V
MDC IDC MSMT LEADCHNL RA PACING THRESHOLD PULSEWIDTH: 0.4 ms
MDC IDC SET LEADCHNL RV SENSING SENSITIVITY: 2.8 mV
MDC IDC STAT BRADY AP VS PERCENT: 0 %
MDC IDC STAT BRADY AS VS PERCENT: 0 %

## 2014-06-01 NOTE — Telephone Encounter (Signed)
Spoke with pt and reminded pt of remote transmission that is due today. Pt verbalized understanding.   

## 2014-06-01 NOTE — Progress Notes (Signed)
Remote pacemaker transmission.   

## 2014-06-17 ENCOUNTER — Encounter: Payer: Self-pay | Admitting: Cardiology

## 2014-06-21 ENCOUNTER — Encounter: Payer: Self-pay | Admitting: Internal Medicine

## 2014-07-01 ENCOUNTER — Encounter: Payer: Self-pay | Admitting: Cardiology

## 2014-08-30 ENCOUNTER — Telehealth: Payer: Self-pay

## 2014-08-30 DIAGNOSIS — F411 Generalized anxiety disorder: Secondary | ICD-10-CM

## 2014-08-30 MED ORDER — CLONAZEPAM 0.5 MG PO TABS: 0.5000 mg | ORAL_TABLET | Freq: Three times a day (TID) | ORAL | Status: DC | PRN

## 2014-08-30 NOTE — Telephone Encounter (Signed)
Klonopin requested Last seen and filled 04/26/14 #90 with 1 refill UDS 05/22/14   Please advise    KP

## 2014-08-30 NOTE — Telephone Encounter (Signed)
Refill x1   2 refills 

## 2014-09-02 ENCOUNTER — Encounter: Payer: 59 | Admitting: *Deleted

## 2014-09-02 ENCOUNTER — Telehealth: Payer: Self-pay | Admitting: Cardiology

## 2014-09-02 NOTE — Telephone Encounter (Signed)
LMOVM reminding pt to send remote transmission.   

## 2014-09-06 ENCOUNTER — Encounter: Payer: Self-pay | Admitting: Cardiology

## 2014-10-02 ENCOUNTER — Emergency Department (HOSPITAL_BASED_OUTPATIENT_CLINIC_OR_DEPARTMENT_OTHER)
Admission: EM | Admit: 2014-10-02 | Discharge: 2014-10-03 | Disposition: A | Discharge status: Home / Self Care | Payer: 59 | Attending: Emergency Medicine | Admitting: Emergency Medicine

## 2014-10-02 ENCOUNTER — Encounter (HOSPITAL_BASED_OUTPATIENT_CLINIC_OR_DEPARTMENT_OTHER): Payer: Self-pay | Admitting: Radiology

## 2014-10-02 ENCOUNTER — Emergency Department (HOSPITAL_BASED_OUTPATIENT_CLINIC_OR_DEPARTMENT_OTHER): Payer: 59

## 2014-10-02 DIAGNOSIS — N939 Abnormal uterine and vaginal bleeding, unspecified: Secondary | ICD-10-CM | POA: Diagnosis present

## 2014-10-02 DIAGNOSIS — Z9049 Acquired absence of other specified parts of digestive tract: Secondary | ICD-10-CM | POA: Insufficient documentation

## 2014-10-02 DIAGNOSIS — Z950 Presence of cardiac pacemaker: Secondary | ICD-10-CM | POA: Insufficient documentation

## 2014-10-02 DIAGNOSIS — F410 Panic disorder [episodic paroxysmal anxiety] without agoraphobia: Secondary | ICD-10-CM | POA: Diagnosis not present

## 2014-10-02 DIAGNOSIS — R0602 Shortness of breath: Secondary | ICD-10-CM | POA: Diagnosis not present

## 2014-10-02 DIAGNOSIS — Z79899 Other long term (current) drug therapy: Secondary | ICD-10-CM | POA: Insufficient documentation

## 2014-10-02 DIAGNOSIS — Z87891 Personal history of nicotine dependence: Secondary | ICD-10-CM | POA: Diagnosis not present

## 2014-10-02 DIAGNOSIS — Z8719 Personal history of other diseases of the digestive system: Secondary | ICD-10-CM | POA: Diagnosis not present

## 2014-10-02 DIAGNOSIS — Z3202 Encounter for pregnancy test, result negative: Secondary | ICD-10-CM | POA: Diagnosis not present

## 2014-10-02 DIAGNOSIS — F329 Major depressive disorder, single episode, unspecified: Secondary | ICD-10-CM | POA: Diagnosis not present

## 2014-10-02 DIAGNOSIS — Z9889 Other specified postprocedural states: Secondary | ICD-10-CM | POA: Diagnosis not present

## 2014-10-02 DIAGNOSIS — R1030 Lower abdominal pain, unspecified: Secondary | ICD-10-CM | POA: Diagnosis not present

## 2014-10-02 DIAGNOSIS — Z8619 Personal history of other infectious and parasitic diseases: Secondary | ICD-10-CM | POA: Diagnosis not present

## 2014-10-02 LAB — CBC WITH DIFFERENTIAL/PLATELET
BASOS ABS: 0.1 10*3/uL (ref 0.0–0.1)
Basophils Relative: 1 % (ref 0–1)
EOS PCT: 1 % (ref 0–5)
Eosinophils Absolute: 0.1 10*3/uL (ref 0.0–0.7)
HCT: 39.9 % (ref 36.0–46.0)
HEMOGLOBIN: 12.9 g/dL (ref 12.0–15.0)
LYMPHS ABS: 3.2 10*3/uL (ref 0.7–4.0)
LYMPHS PCT: 33 % (ref 12–46)
MCH: 30.1 pg (ref 26.0–34.0)
MCHC: 32.3 g/dL (ref 30.0–36.0)
MCV: 93 fL (ref 78.0–100.0)
Monocytes Absolute: 0.8 10*3/uL (ref 0.1–1.0)
Monocytes Relative: 9 % (ref 3–12)
Neutro Abs: 5.6 10*3/uL (ref 1.7–7.7)
Neutrophils Relative %: 56 % (ref 43–77)
PLATELETS: 248 10*3/uL (ref 150–400)
RBC: 4.29 MIL/uL (ref 3.87–5.11)
RDW: 13.5 % (ref 11.5–15.5)
WBC: 9.8 10*3/uL (ref 4.0–10.5)

## 2014-10-02 LAB — HCG, QUANTITATIVE, PREGNANCY

## 2014-10-02 LAB — PREGNANCY, URINE: PREG TEST UR: NEGATIVE

## 2014-10-02 MED ORDER — MORPHINE SULFATE 4 MG/ML IJ SOLN
4.0000 mg | INTRAMUSCULAR | Status: DC | PRN
  Administered 2014-10-02: 4 mg via INTRAVENOUS
  Filled 2014-10-02: qty 1

## 2014-10-02 NOTE — ED Provider Notes (Signed)
CSN: [id number]     Arrival date & time 10/02/14  1944 History   First MD Initiated Contact with Patient 10/02/14 2127     Chief Complaint  Patient presents with  . Vaginal Bleeding  . Shortness of Breath     (Consider location/radiation/quality/duration/timing/severity/associated sxs/prior Treatment) HPI   Blood pressure 147/82, pulse 90, temperature 98 F (36.7 C), temperature source Oral, resp. rate 20, height  (1.626 m), weight 195 lb (88.451 kg), SpO2 100 %.  [name] [name] is a 39 y.o. female G2P1 LMP in May complaining of intermittent vaginal spotting with severe lower abdominal cramping, patient states that the spotting has improved, there is been no passage of tissue but the abdominal midline pain has worsened significantly tonight to 7 out of 10. Patient states that she had a positive home urine pregnancy test one week ago. States that she took the test because she has been gaining weight, her acne has gotten worse and her breasts have been hurting. [place] and she started spotting lightly. She called her OB/GYN who encouraged her to go to the ED but patient wanted to wait until she came back home. She denies passage of tissue.  Past Medical History  Diagnosis Date  . Abnormal Pap smear 2004    colpo  . H/O candidiasis   . H/O varicella 2005  . Pelvic pain in female 2007  . Candida vaginitis 05/2006  . IBS (irritable bowel syndrome)   . 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 Depression   . Anxiety   . Panic attack   . Alcohol abuse    Past Surgical History  Procedure Laterality Date  . Tonsillectomy  1995  . Cholecystectomy    . Ep study and ablation  04/29/12    slow pathway ablation by Dr Johney Frame   . Pacemaker insertion  04/30/12    Medtronic Adapta L implanted by Dr Ladona Ridgel for complete heart block  .  Supraventricular tachycardia ablation N/A 04/29/2012    Procedure: SUPRAVENTRICULAR TACHYCARDIA ABLATION;  Surgeon: Hillis Range, MD;  Location: [facility] CATH LAB;  Service: Cardiovascular;  Laterality: N/A;  . Permanent pacemaker insertion N/A 04/30/2012    Procedure: PERMANENT PACEMAKER INSERTION;  Surgeon: Marinus Maw, MD;  Location: [facility] CATH LAB;  Service: Cardiovascular;  Laterality: N/A;   Family History  Problem Relation Age of Onset  . Diabetes Mother   . Cancer Sister     Lump removed frfom shoulder  . Heart disease Maternal Grandfather     MI  . Cancer Paternal Grandmother     breast   History  Substance Use Topics  . Smoking status: Former Smoker -- 0.50 packs/day    Types: Cigarettes  . Smokeless tobacco: Never Used  . Alcohol Use: Yes   OB History    Gravida Para Term Preterm AB TAB SAB Ectopic Multiple Living   Review of Systems  10 systems reviewed and found to be negative, except as noted in the HPI.   Allergies  Review of patient's allergies indicates no known allergies.  Home Medications   Prior to Admission medications   Medication Sig Start Date End Date Taking? Authorizing Provider  Aspirin-Salicylamide-Caffeine (BC HEADACHE POWDER PO) Take 1 packet by mouth daily as needed (for  headaches).    Historical Provider, MD  buPROPion (WELLBUTRIN) 75 MG tablet Take 1 tablet (75 mg total) by mouth every morning. 05/24/14   Osvaldo Shipper, MD  clonazePAM (KLONOPIN) 0.5 MG tablet Take 1 tablet (0.5 mg total) by mouth 3 (three) times daily as needed for anxiety. 08/30/14   Grayling Congress Lowne, DO  escitalopram (LEXAPRO) 20 MG tablet Take 1 tablet (20 mg total) by mouth daily. 04/26/14   Lelon Perla, DO  fludrocortisone (FLORINEF) 0.1 MG tablet 1 po qd Patient not taking: Reported on 05/23/2014 04/26/14   Lelon Perla, DO  JUNEL FE 1/20 1-20 MG-MCG tablet Take 1 tablet by mouth daily. 09/15/12   Historical Provider, MD  Multiple Vitamins-Minerals  (MULTIVITAMIN) tablet Take 1 tablet by mouth daily. 05/24/14   Osvaldo Shipper, MD  ondansetron (ZOFRAN ODT) 4 MG disintegrating tablet Take 1 tablet (4 mg total) by mouth every 8 (eight) hours as needed for nausea or vomiting. 04/26/14   Lelon Perla, DO  rizatriptan (MAXALT) 10 MG tablet TAKE 1 TABLET (10 MG) BY MOUTH AS NEEDED FOR MIGRAINE. MAY REPEAT ONE TIME IN 2 HOURS IF NEEDED 04/26/14   Grayling Congress Lowne, DO   BP 144/87 mmHg  Pulse 73  Temp(Src) 98 F (36.7 C) (Oral)  Resp 18  Ht 5\' 4"  (1.626 m)  Wt 195 lb (88.451 kg)  BMI 33.46 kg/m2  SpO2 97%  LMP 07/15/2014 Physical Exam  Constitutional: She is oriented to person, place, and time. She appears well-developed and well-nourished. No distress.  HENT:  Head: Normocephalic.  Mouth/Throat: Oropharynx is clear and moist.  Eyes: Conjunctivae and EOM are normal.  Neck: Normal range of motion.  Cardiovascular: Normal rate and regular rhythm.   Pulmonary/Chest: Effort normal and breath sounds normal. No stridor. No respiratory distress. She has no wheezes. She has no rales. She exhibits no tenderness.  Abdominal: Soft. Bowel sounds are normal. She exhibits no distension and no mass. There is no tenderness. There is no rebound and no guarding.  Musculoskeletal: Normal range of motion.  Neurological: She is alert and oriented to person, place, and time.  Psychiatric: She has a normal mood and affect.  Nursing note and vitals reviewed.   ED Course  Procedures (including critical care time) Labs Review Labs Reviewed  COMPREHENSIVE METABOLIC PANEL - Abnormal; Notable for the following:    Glucose, Bld 103 (*)    Total Bilirubin 0.1 (*)    All other components within normal limits  CBC WITH DIFFERENTIAL/PLATELET  PREGNANCY, URINE  HCG, QUANTITATIVE, PREGNANCY  RH IG WORKUP (INCLUDES ABO/RH)    Imaging Review US Ob Comp Less 14 Wks  10/03/2014   CLINICAL DATA:  39 year old female with positive home pregnancy test. Urine pregnancy  test in the ER was negative. Patient presenting with vaginal bleeding  EXAM: OBSTETRIC <14 WK [place] AND TRANSVAGINAL OB US  TECHNIQUE: Both transabdominal and transvaginal ultrasound examinations were performed for complete evaluation of the gestation as well as the maternal uterus, adnexal regions, and pelvic cul-de-sac. Transvaginal technique was performed to assess early pregnancy.  COMPARISON:  None.  FINDINGS: The uterus is anteverted appears grossly unremarkable. No intrauterine pregnancy identified. The possibility of an ectopic pregnancy is not excluded. Correlation with HCG levels and follow-up with ultrasound if clinically indicated recommended.  The endometrium measures 8 mm in thickness. A 4 mm endometrial echogenic focus may represent an area of scarring.  The ovaries appear unremarkable. Multiple small bilateral ovarian follicles/cysts noted. A 2.1 x  1.3 x 1.5 cm anechoic area adjacent to the left ovary likely represents a paraovarian cyst.  IMPRESSION: No intrauterine pregnancy identified. Correlation with clinical exam and HCG levels and follow-up with ultrasound if clinically indicated recommended.  Unremarkable uterus and ovaries.  Probable left paraovarian cyst.   Electronically Signed   By: Elgie Collard M.D.   On: 10/03/2014 00:12   US Ob Transvaginal  10/03/2014   CLINICAL DATA:  39 year old female with positive home pregnancy test. Urine pregnancy test in the ER was negative. Patient presenting with vaginal bleeding  EXAM: OBSTETRIC <14 WK [place] AND TRANSVAGINAL OB US  TECHNIQUE: Both transabdominal and transvaginal ultrasound examinations were performed for complete evaluation of the gestation as well as the maternal uterus, adnexal regions, and pelvic cul-de-sac. Transvaginal technique was performed to assess early pregnancy.  COMPARISON:  None.  FINDINGS: The uterus is anteverted appears grossly unremarkable. No intrauterine pregnancy identified. The possibility of an ectopic pregnancy is  not excluded. Correlation with HCG levels and follow-up with ultrasound if clinically indicated recommended.  The endometrium measures 8 mm in thickness. A 4 mm endometrial echogenic focus may represent an area of scarring.  The ovaries appear unremarkable. Multiple small bilateral ovarian follicles/cysts noted. A 2.1 x 1.3 x 1.5 cm anechoic area adjacent to the left ovary likely represents a paraovarian cyst.  IMPRESSION: No intrauterine pregnancy identified. Correlation with clinical exam and HCG levels and follow-up with ultrasound if clinically indicated recommended.  Unremarkable uterus and ovaries.  Probable left paraovarian cyst.   Electronically Signed   By: Elgie Collard M.D.   On: 10/03/2014 00:12     EKG Interpretation None      MDM   Final diagnoses:  Lower abdominal pain    Filed Vitals:   10/02/14 1948 10/02/14 2312  BP: 147/82 144/87  Pulse: 90 73  Temp: 98 F (36.7 C)   TempSrc: Oral   Resp: 20 18  Height: 5\' 4"  (1.626 m)   Weight: 195 lb (88.451 kg)   SpO2: 100% 97%    Medications  morphine 4 MG/ML injection 4 mg (4 mg Intravenous Given 10/02/14 2229)    [name] [name] is a pleasant 39 y.o. female presenting with lower abdominal spotting and cramping. Had positive home pregnancy test one week ago. Urine pregnancy test is negative here, will obtain ultrasound to further evaluate, Quant is pending.  Quantitative hCG is also negative, ultrasound with no signs of IUP and normal ovaries. Offered patient pelvic exam but she has a OB/GYN appointment in 1 week, states she will follow with her OB/GYN. Encourage close follow-up with primary care as well.  Evaluation does not show pathology that would require ongoing emergent intervention or inpatient treatment. Pt is hemodynamically stable and mentating appropriately. Discussed findings and plan with patient/guardian, who agrees with care plan. All questions answered. Return precautions discussed and outpatient follow  up given.       Wynetta Emery, PA-C 10/03/14 0030  Eber Hong, MD 10/04/14 [phone]

## 2014-10-02 NOTE — ED Notes (Signed)
Pt reports vag bleeding spotting  Over last 2 weeks with intermittant heavier bleeding OB doctor aware. Pt just return from out of town and is now c/o SOB but admits to Hx anxiety attacks leading to same. Also reports abd pain intermitant since Sunday past.

## 2014-10-03 LAB — COMPREHENSIVE METABOLIC PANEL
ALBUMIN: 4.5 g/dL (ref 3.5–5.0)
ALT: 28 U/L (ref 14–54)
ANION GAP: 8 (ref 5–15)
AST: 29 U/L (ref 15–41)
Alkaline Phosphatase: 48 U/L (ref 38–126)
BUN: 11 mg/dL (ref 6–20)
CO2: 25 mmol/L (ref 22–32)
CREATININE: 0.88 mg/dL (ref 0.44–1.00)
Calcium: 9.2 mg/dL (ref 8.9–10.3)
Chloride: 108 mmol/L (ref 101–111)
GFR calc Af Amer: >60 mL/min (ref >60)
GFR calc non Af Amer: >60 mL/min (ref >60)
GLUCOSE: 103 mg/dL — AB (ref 65–99)
POTASSIUM: 4.1 mmol/L (ref 3.5–5.1)
SODIUM: 141 mmol/L (ref 135–145)
TOTAL PROTEIN: 7.6 g/dL (ref 6.5–8.1)
Total Bilirubin: 0.1 mg/dL — ABNORMAL LOW (ref 0.3–1.2)

## 2014-10-03 NOTE — Discharge Instructions (Signed)
For pain control please take ibuprofen (also known as Motrin or Advil)  (this is normally 4 over the counter pills) 3 times a day  for 5 days. Take with food to minimize stomach irritation.  Please follow with your primary care doctor in the next 2 days for a check-up. They must obtain records for further management.   Do not hesitate to return to the Emergency Department for any new, worsening or concerning symptoms.   Abdominal Pain Many things can cause abdominal pain. Usually, abdominal pain is not caused by a disease and will improve without treatment. It can often be observed and treated at home. Your health care provider will do a physical exam and possibly order blood tests and X-rays to help determine the seriousness of your pain. However, in many cases, more time must pass before a clear cause of the pain can be found. Before that point, your health care provider may not know if you need more testing or further treatment. HOME CARE INSTRUCTIONS  Monitor your abdominal pain for any changes. The following actions may help to alleviate any discomfort you are experiencing:  Only take over-the-counter or prescription medicines as directed by your health care provider.  Do not take laxatives unless directed to do so by your health care provider.  Try a clear liquid diet (broth, tea, or water) as directed by your health care provider. Slowly move to a bland diet as tolerated. SEEK MEDICAL CARE IF:  You have unexplained abdominal pain.  You have abdominal pain associated with nausea or diarrhea.  You have pain when you urinate or have a bowel movement.  You experience abdominal pain that wakes you in the night.  You have abdominal pain that is worsened or improved by eating food.  You have abdominal pain that is worsened with eating fatty foods.  You have a fever. SEEK IMMEDIATE MEDICAL CARE IF:   Your pain does not go away within 2 hours.  You keep throwing up  (vomiting).  Your pain is felt only in portions of the abdomen, such as the right side or the left lower portion of the abdomen.  You pass bloody or black tarry stools. MAKE SURE YOU:  Understand these instructions.   Will watch your condition.   Will get help right away if you are not doing well or get worse.  Document Released: 12/06/2004 Document Revised: 03/03/2013 Document Reviewed: 11/05/2012 [facility] Patient Information 2015 [place], [place]. This information is not intended to replace advice given to you by your health care provider. Make sure you discuss any questions you have with your health care provider.

## 2014-11-14 IMAGING — CT CT HEAD W/O CM
2 series · 16 of 30 positions shown, 18 images · non-contrast
Comparison: CT scan of the brain dated October 20, 2007.

CLINICAL DATA: Syncopal episode,, dizziness, history of cardiac
dysrhythmia

EXAM:
CT HEAD WITHOUT CONTRAST
TECHNIQUE: Contiguous axial images were obtained from the base of the skull
through the vertex without intravenous contrast.

[Series 2: head 4.8 h37s · axial · 0.42mm/px · z∈[-129,-15]mm · 8 of 32 slices shown, 10 images]
[im 4/32  brain]
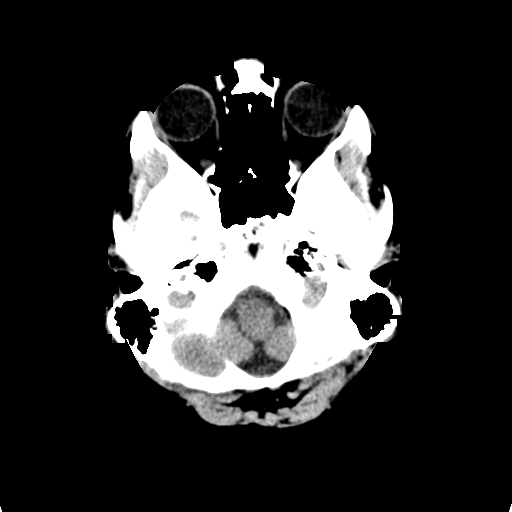
[im 4/32  bone]
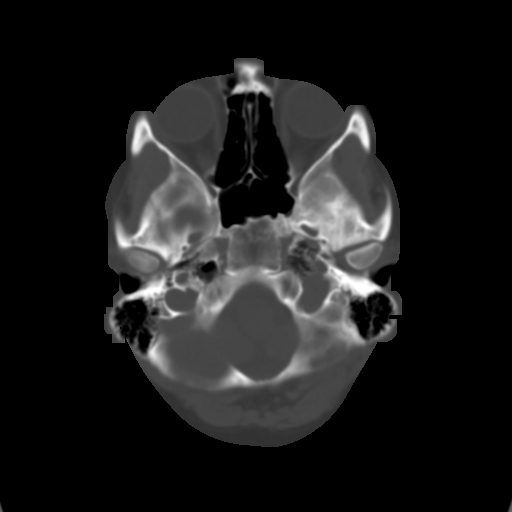
[im 7/32  brain]
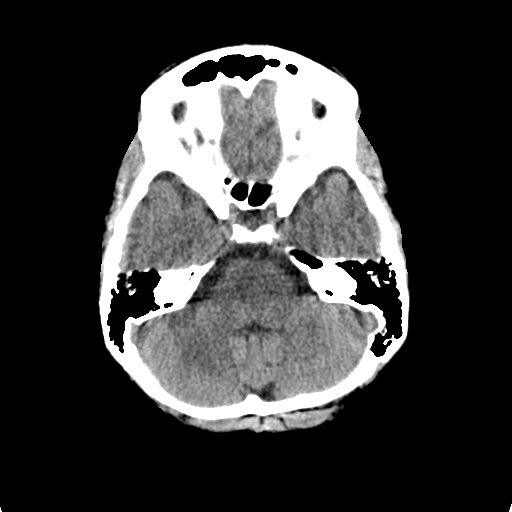
[im 11/32  brain]
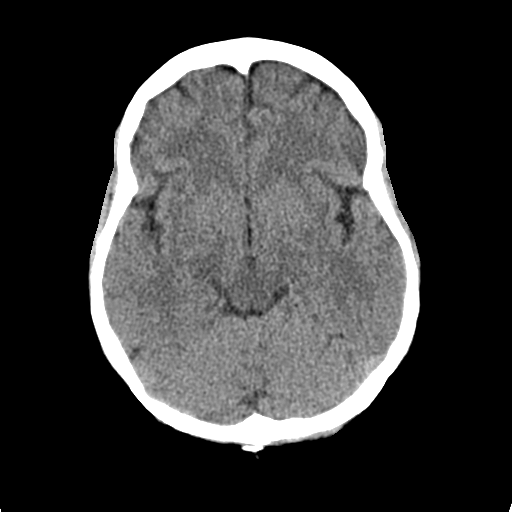
[im 14/32  brain]
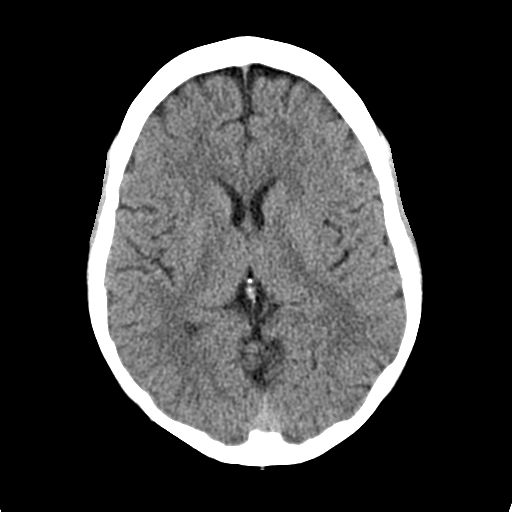
[im 18/32  brain]
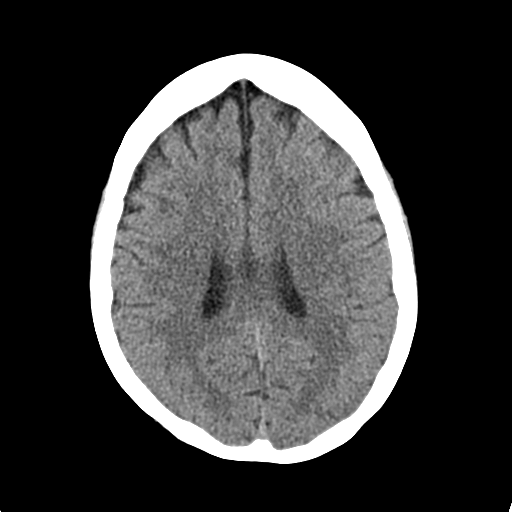
[im 18/32  bone]
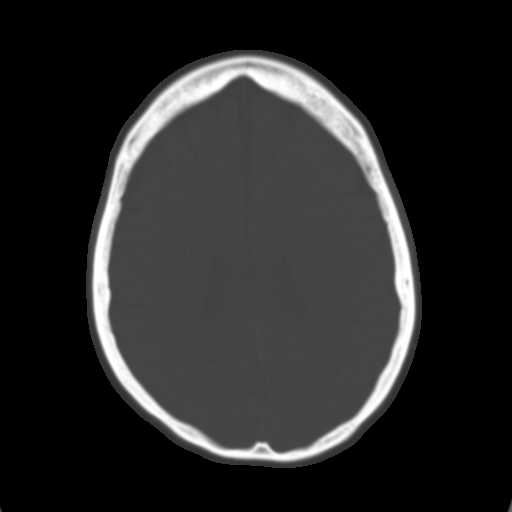
[im 21/32  brain]
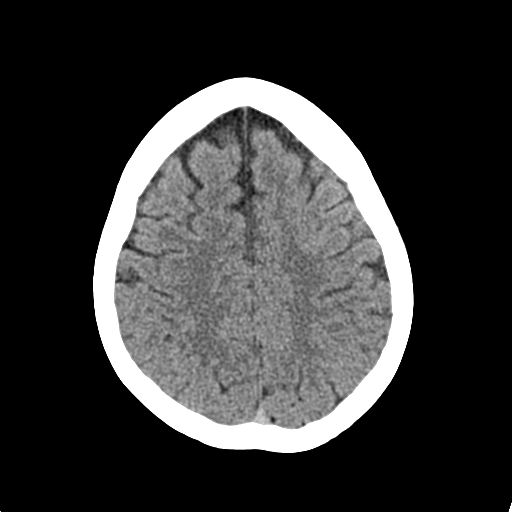
[im 25/32  brain]
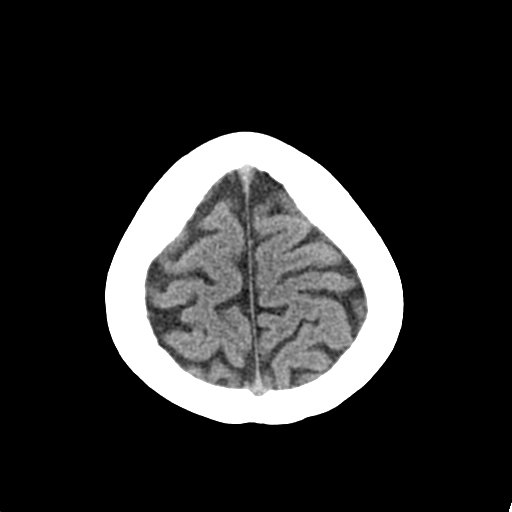
[im 28/32  brain]
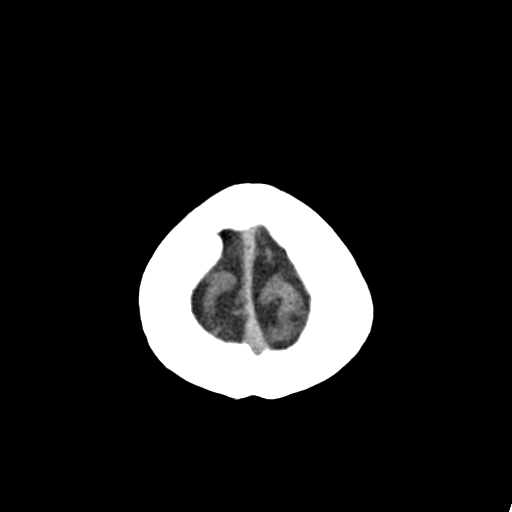

[Series 3: head 2.4 h60s bone · axial · 0.42mm/px · z∈[-130,-11]mm · 8 of 64 slices shown]
[im 7/64  bone]
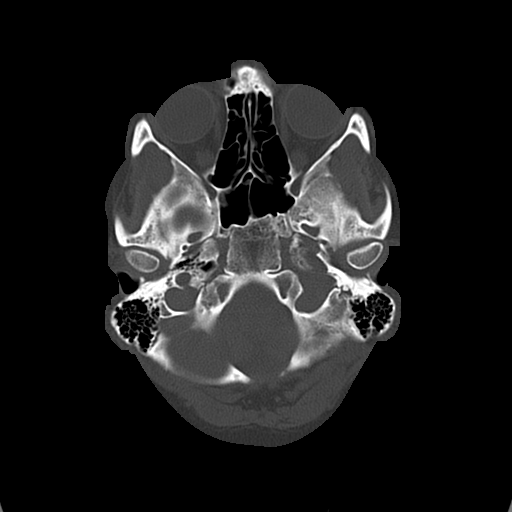
[im 14/64  bone]
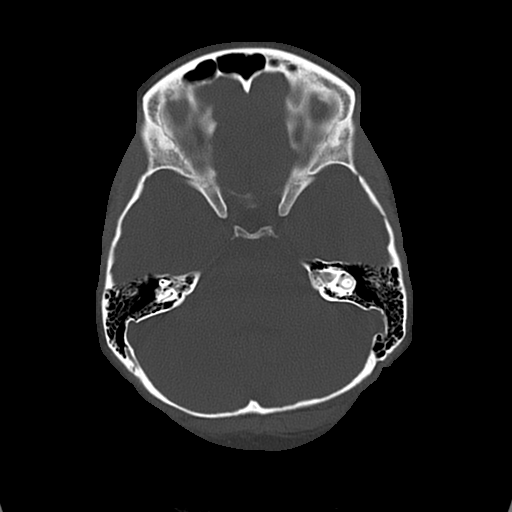
[im 20/64  bone]
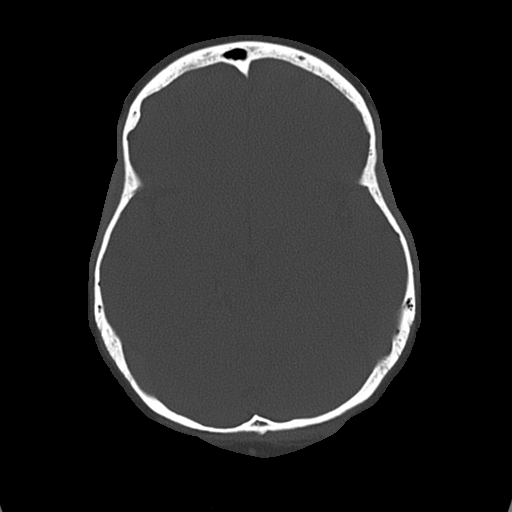
[im 27/64  bone]
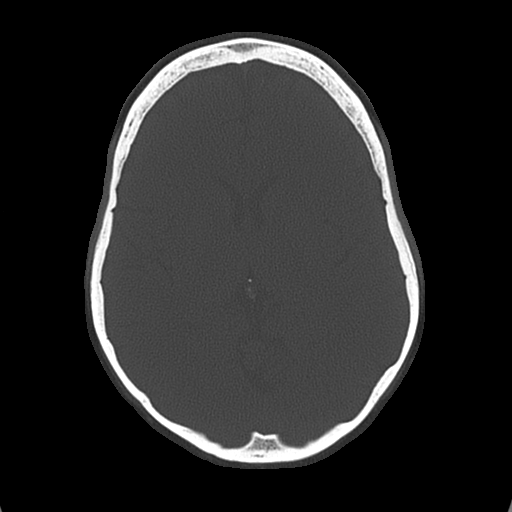
[im 37/64  bone]
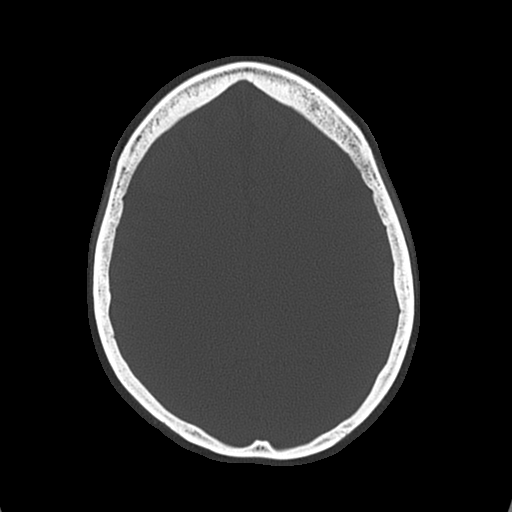
[im 44/64  bone]
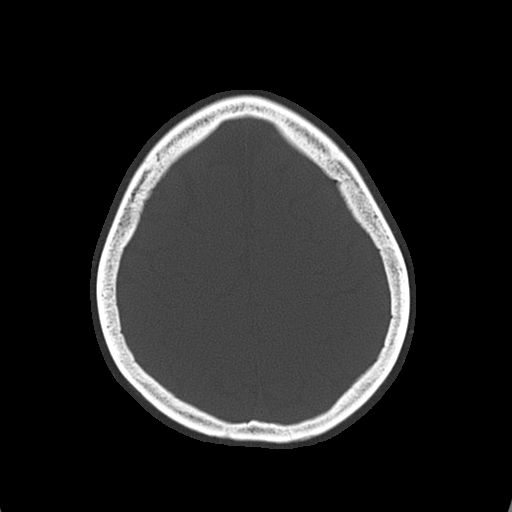
[im 50/64  bone]
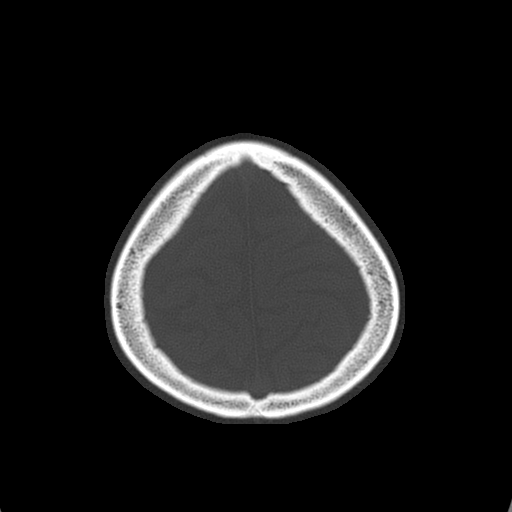
[im 57/64  bone]
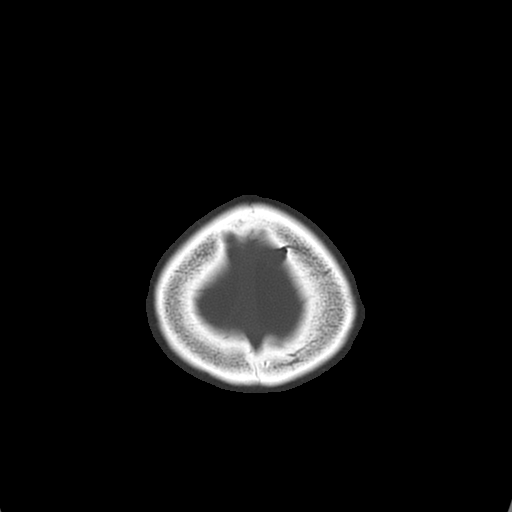

[16 of 30 positions shown; findings below may reference images not displayed]

FINDINGS: The ventricles are normal in size and position. There is no
intracranial hemorrhage nor intracranial mass effect. There is no
evidence of an evolving ischemic infarction. The cerebellum and
brainstem are normal in density. There are no abnormal intracranial
calcifications.

At bone window settings there is no evidence of an acute skull
fracture. The observed portions of the paranasal sinuses are clear.
IMPRESSION: 1. There is no evidence of acute intracranial hemorrhage nor of an
evolving ischemic infarction.
2. There is no intracranial mass effect or hydrocephalus.
3. There is no evidence of an acute skull fracture. The observed
portions of the paranasal sinuses are clear.

## 2014-11-29 ENCOUNTER — Other Ambulatory Visit: Payer: Self-pay | Admitting: Family Medicine

## 2014-11-29 NOTE — Telephone Encounter (Signed)
Last seen 04/26/14 and filled 08/30/14 #90 with 2 refills.   Please advise    KP

## 2014-11-30 MED ORDER — CLONAZEPAM 0.5 MG PO TABS: ORAL_TABLET | ORAL | Status: DC

## 2014-11-30 NOTE — Telephone Encounter (Signed)
Rx will be faxed once signed.     KP 

## 2014-11-30 NOTE — Addendum Note (Signed)
Addended by: Arnette Norris on: 11/30/2014 08:11 AM   Modules accepted: Orders

## 2014-12-27 ENCOUNTER — Ambulatory Visit (INDEPENDENT_AMBULATORY_CARE_PROVIDER_SITE_OTHER): Payer: BLUE CROSS/BLUE SHIELD | Admitting: Family Medicine

## 2014-12-27 ENCOUNTER — Encounter: Payer: Self-pay | Admitting: Family Medicine

## 2014-12-27 VITALS — BP 137/86 | HR 83 | Temp 98.3°F | Wt 198.2 lb

## 2014-12-27 DIAGNOSIS — G4700 Insomnia, unspecified: Secondary | ICD-10-CM

## 2014-12-27 DIAGNOSIS — S0922XA Traumatic rupture of left ear drum, initial encounter: Secondary | ICD-10-CM | POA: Diagnosis not present

## 2014-12-27 MED ORDER — ALPRAZOLAM 0.5 MG PO TABS: 0.5000 mg | ORAL_TABLET | Freq: Every day | ORAL | Status: DC

## 2014-12-27 MED ORDER — LIRAGLUTIDE -WEIGHT MANAGEMENT 18 MG/3ML ~~LOC~~ SOPN: 3.0000 mg | PEN_INJECTOR | Freq: Every day | SUBCUTANEOUS | Status: DC

## 2014-12-27 MED ORDER — TRAMADOL HCL 50 MG PO TABS: 50.0000 mg | ORAL_TABLET | Freq: Four times a day (QID) | ORAL | Status: DC | PRN

## 2014-12-27 NOTE — Progress Notes (Signed)
Pre visit review using our clinic review tool, if applicable. No additional management support is needed unless otherwise documented below in the visit note. 

## 2014-12-27 NOTE — Progress Notes (Signed)
Patient ID: [name] [name], female    DOB: 1976-01-13  Age: 39 y.o. MRN: [id number]    Subjective:  Subjective HPI [name] [name] presents with c/o L ear pain--  She punctured her ear drum with a q tip.  She has been in a lot of pain. She is also struggling with weight gain and can not seen to loose weight.  She is exercising regularly and trying to eat healthy.    She is also c/o insomnia and waking up at night and not being able to fall asleep.     Review of Systems  Constitutional: Negative for diaphoresis, appetite change, fatigue and unexpected weight change.  Eyes: Negative for pain, redness and visual disturbance.  Respiratory: Negative for cough, chest tightness, shortness of breath and wheezing.   Cardiovascular: Negative for chest pain, palpitations and leg swelling.  Endocrine: Negative for cold intolerance, heat intolerance, polydipsia, polyphagia and polyuria.  Genitourinary: Negative for dysuria, frequency and difficulty urinating.  Neurological: Negative for dizziness, light-headedness, numbness and headaches.  Psychiatric/Behavioral: Negative for dysphoric mood. The patient is not nervous/anxious.   All other systems reviewed and are negative.   History Past Medical History  Diagnosis Date  . Abnormal Pap smear 2004    colpo  . H/O candidiasis   . H/O varicella 2005  . Pelvic pain in female 2007  . Candida vaginitis 05/2006  . IBS (irritable bowel syndrome)   . 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 Depression   . Anxiety   . Panic attack   . Alcohol abuse     She has past surgical history that includes Tonsillectomy (1995); Cholecystectomy; EP study and ablation (04/29/12); Pacemaker insertion (04/30/12); supraventricular tachycardia ablation (N/A, 04/29/2012); and permanent pacemaker insertion (N/A, 04/30/2012).   Her family  history includes Cancer in her paternal grandmother and sister; Diabetes in her mother; Heart disease in her maternal grandfather.She reports that she has quit smoking. Her smoking use included Cigarettes. She smoked 0.50 packs per day. She has never used smokeless tobacco. She reports that she drinks alcohol. She reports that she does not use illicit drugs.  Current Outpatient Prescriptions on File Prior to Visit  Medication Sig Dispense Refill  . Aspirin-Salicylamide-Caffeine (BC HEADACHE POWDER PO) Take 1 packet by mouth daily as needed (for headaches).    Colleen Can FE 1/20 1-20 MG-MCG tablet Take 1 tablet by mouth daily.    . Multiple Vitamins-Minerals (MULTIVITAMIN) tablet Take 1 tablet by mouth daily. 30 tablet 0  . rizatriptan (MAXALT) 10 MG tablet TAKE 1 TABLET (10 MG) BY MOUTH AS NEEDED FOR MIGRAINE. MAY REPEAT ONE TIME IN 2 HOURS IF NEEDED 30 tablet 0   No current facility-administered medications on file prior to visit.     Objective:  Objective Physical Exam  Constitutional: She is oriented to person, place, and time. She appears well-developed and well-nourished.  HENT:  Head: Normocephalic and atraumatic.  Right Ear: Hearing, tympanic membrane, external ear and ear canal normal.  Left Ear: No foreign bodies. Tympanic membrane is perforated.  No middle ear effusion.  Eyes: Conjunctivae and EOM are normal.  Neck: Normal range of motion. Neck supple. No JVD present. Carotid bruit is not present. No thyromegaly present.  Cardiovascular: Normal rate, regular rhythm and normal heart sounds.   No murmur heard. Pulmonary/Chest: Effort normal and breath sounds normal. No respiratory  distress. She has no wheezes. She has no rales. She exhibits no tenderness.  Musculoskeletal: She exhibits no edema.  Neurological: She is alert and oriented to person, place, and time.  Psychiatric: She has a normal mood and affect.  Nursing note and vitals reviewed.  BP 137/86 mmHg  Pulse 83   Temp(Src) 98.3 F (36.8 C) (Oral)  Wt 198 lb 3.2 oz (89.903 kg)  SpO2 97%  LMP 12/20/2014  Breastfeeding? Unknown Wt Readings from Last 3 Encounters:  12/27/14 198 lb 3.2 oz (89.903 kg)  10/02/14 195 lb (88.451 kg)  05/23/14 191 lb 12.8 oz (87 kg)     Lab Results  Component Value Date   WBC 9.8 10/02/2014   HGB 12.9 10/02/2014   HCT 39.9 10/02/2014   PLT 248 10/02/2014   GLUCOSE 103* 10/02/2014   CHOL 135 01/14/2013   TRIG 64.0 01/14/2013   HDL 63.70 01/14/2013   LDLCALC 59 01/14/2013   ALT 28 10/02/2014   AST 29 10/02/2014   NA 141 10/02/2014   K 4.1 10/02/2014   CL 108 10/02/2014   CREATININE 0.88 10/02/2014   BUN 11 10/02/2014   CO2 25 10/02/2014   TSH 0.844 03/19/2014   INR 0.92 05/31/2012    US Ob Comp Less 14 Wks  10/03/2014  CLINICAL DATA:  39 year old female with positive home pregnancy test. Urine pregnancy test in the ER was negative. Patient presenting with vaginal bleeding EXAM: OBSTETRIC <14 WK [place] AND TRANSVAGINAL OB US TECHNIQUE: Both transabdominal and transvaginal ultrasound examinations were performed for complete evaluation of the gestation as well as the maternal uterus, adnexal regions, and pelvic cul-de-sac. Transvaginal technique was performed to assess early pregnancy. COMPARISON:  None. FINDINGS: The uterus is anteverted appears grossly unremarkable. No intrauterine pregnancy identified. The possibility of an ectopic pregnancy is not excluded. Correlation with HCG levels and follow-up with ultrasound if clinically indicated recommended. The endometrium measures 8 mm in thickness. A 4 mm endometrial echogenic focus may represent an area of scarring. The ovaries appear unremarkable. Multiple small bilateral ovarian follicles/cysts noted. A 2.1 x 1.3 x 1.5 cm anechoic area adjacent to the left ovary likely represents a paraovarian cyst. IMPRESSION: No intrauterine pregnancy identified. Correlation with clinical exam and HCG levels and follow-up with  ultrasound if clinically indicated recommended. Unremarkable uterus and ovaries.  Probable left paraovarian cyst. Electronically Signed   By: Elgie Collard M.D.   On: 10/03/2014 00:12   US Ob Transvaginal  10/03/2014  CLINICAL DATA:  39 year old female with positive home pregnancy test. Urine pregnancy test in the ER was negative. Patient presenting with vaginal bleeding EXAM: OBSTETRIC <14 WK [place] AND TRANSVAGINAL OB US TECHNIQUE: Both transabdominal and transvaginal ultrasound examinations were performed for complete evaluation of the gestation as well as the maternal uterus, adnexal regions, and pelvic cul-de-sac. Transvaginal technique was performed to assess early pregnancy. COMPARISON:  None. FINDINGS: The uterus is anteverted appears grossly unremarkable. No intrauterine pregnancy identified. The possibility of an ectopic pregnancy is not excluded. Correlation with HCG levels and follow-up with ultrasound if clinically indicated recommended. The endometrium measures 8 mm in thickness. A 4 mm endometrial echogenic focus may represent an area of scarring. The ovaries appear unremarkable. Multiple small bilateral ovarian follicles/cysts noted. A 2.1 x 1.3 x 1.5 cm anechoic area adjacent to the left ovary likely represents a paraovarian cyst. IMPRESSION: No intrauterine pregnancy identified. Correlation with clinical exam and HCG levels and follow-up with ultrasound if clinically indicated recommended. Unremarkable uterus and ovaries.  Probable left paraovarian cyst. Electronically Signed   By: Elgie CollardArash  Radparvar M.D.   On: 10/03/2014 00:12     Assessment & Plan:  Plan I have discontinued Ms. Misner's escitalopram, ondansetron, fludrocortisone, buPROPion, and clonazePAM. I am also having her start on traMADol, ALPRAZolam, and Liraglutide -Weight Management. Additionally, I am having her maintain her Aspirin-Salicylamide-Caffeine (BC HEADACHE POWDER PO), JUNEL FE 1/20, rizatriptan, and  multivitamin.  Meds ordered this encounter  Medications  . traMADol (ULTRAM) 50 MG tablet    Sig: Take 1 tablet (50 mg total) by mouth every 6 (six) hours as needed for severe pain.    Dispense:  30 tablet    Refill:  0  . ALPRAZolam (XANAX) 0.5 MG tablet    Sig: Take 1 tablet (0.5 mg total) by mouth at bedtime.    Dispense:  30 tablet    Refill:  2  . Liraglutide -Weight Management (SAXENDA) 18 MG/3ML SOPN    Sig: Inject 3 mg into the skin daily.    Dispense:  5 pen    Refill:  5    Problem List Items Addressed This Visit    Morbid obesity (HCC)    Start meds Exercise and diet discussed rto 1 month      Relevant Medications   Liraglutide -Weight Management (SAXENDA) 18 MG/3ML SOPN   Insomnia    ? Sleep apnea Refer to pulmonary      Relevant Medications   ALPRAZolam (XANAX) 0.5 MG tablet   Other Relevant Orders   Ambulatory referral to Pulmonology    Other Visit Diagnoses    Puncture wound of ear drum, left, initial encounter    -  Primary    Relevant Medications    traMADol (ULTRAM) 50 MG tablet    Other Relevant Orders    Ambulatory referral to ENT       Follow-up: Return in about 4 weeks (around 01/24/2015), or if symptoms worsen or fail to improve.  Loreen FreudYvonne Lowne, DO

## 2014-12-27 NOTE — Patient Instructions (Signed)
Obesity Obesity is defined as having too much total body fat and a body mass index (BMI) of 30 or more. BMI is an estimate of body fat and is calculated from your height and weight. BMI is typically calculated by your health care provider during regular wellness visits. Obesity happens when you consume more calories than you can burn by exercising or performing daily physical tasks. Prolonged obesity can cause major illnesses or emergencies, such as:  Stroke.  Heart disease.  Diabetes.  Cancer.  Arthritis.  High blood pressure (hypertension).  High cholesterol.  Sleep apnea.  Erectile dysfunction.  Infertility problems. CAUSES   Regularly eating unhealthy foods.  Physical inactivity.  Certain disorders, such as an underactive thyroid (hypothyroidism), Cushing's syndrome, and polycystic ovarian syndrome.  Certain medicines, such as steroids, some depression medicines, and antipsychotics.  Genetics.  Lack of sleep. DIAGNOSIS A health care provider can diagnose obesity after calculating your BMI. Obesity will be diagnosed if your BMI is 30 or higher. There are other methods of measuring obesity levels. Some other methods include measuring your skinfold thickness, your waist circumference, and comparing your hip circumference to your waist circumference. TREATMENT  A healthy treatment program includes some or all of the following:  Long-term dietary changes.  Exercise and physical activity.  Behavioral and lifestyle changes.  Medicine only under the supervision of your health care provider. Medicines may help, but only if they are used with diet and exercise programs. If your BMI is 40 or higher, your health care provider may recommend specialized surgery or programs to help with weight loss. An unhealthy treatment program includes:  Fasting.  Fad diets.  Supplements and drugs. These choices do not succeed in long-term weight control. HOME CARE  INSTRUCTIONS  Exercise and perform physical activity as directed by your health care provider. To increase physical activity, try the following:  Use stairs instead of elevators.  Park farther away from store entrances.  Garden, bike, or walk instead of watching television or using the computer.  Eat healthy, low-calorie foods and drinks on a regular basis. Eat more fruits and vegetables. Use low-calorie cookbooks or take healthy cooking classes.  Limit fast food, sweets, and processed snack foods.  Eat smaller portions.  Keep a daily journal of everything you eat. There are many free websites to help you with this. It may be helpful to measure your foods so you can determine if you are eating the correct portion sizes.  Avoid drinking alcohol. Drink more water and drinks without calories.  Take vitamins and supplements only as recommended by your health care provider.  Weight-loss support groups, registered dietitians, counselors, and stress reduction education can also be very helpful. SEEK IMMEDIATE MEDICAL CARE IF:  You have chest pain or tightness.  You have trouble breathing or feel short of breath.  You have weakness or leg numbness.  You feel confused or have trouble talking.  You have sudden changes in your vision.   This information is not intended to replace advice given to you by your health care provider. Make sure you discuss any questions you have with your health care provider.   Document Released: 04/05/2004 Document Revised: 03/19/2014 Document Reviewed: 04/04/2011 Elsevier Interactive Patient Education 2016 [employer]  

## 2014-12-27 NOTE — Assessment & Plan Note (Signed)
?   Sleep apnea Refer to pulmonary

## 2014-12-27 NOTE — Assessment & Plan Note (Signed)
Start meds Exercise and diet discussed rto 1 month

## 2015-01-03 ENCOUNTER — Encounter: Payer: Self-pay | Admitting: Family Medicine

## 2015-01-03 MED ORDER — HYDROCODONE-ACETAMINOPHEN 5-325 MG PO TABS: 1.0000 | ORAL_TABLET | Freq: Four times a day (QID) | ORAL | Status: DC | PRN

## 2015-01-03 NOTE — Telephone Encounter (Signed)
vicodin 5/325 #30  1 po q6h prn-- if still no relief she should call ent

## 2015-01-12 ENCOUNTER — Encounter: Payer: Self-pay | Admitting: Family Medicine

## 2015-01-13 ENCOUNTER — Telehealth: Payer: Self-pay | Admitting: *Deleted

## 2015-01-13 NOTE — Telephone Encounter (Signed)
PA initiated for Saxenda. Awaiting determination. JG//CMA

## 2015-01-14 ENCOUNTER — Telehealth: Payer: Self-pay | Admitting: Family Medicine

## 2015-01-14 NOTE — Telephone Encounter (Signed)
Spoke with patient and she stated she does not want to call the ENT because we are closer than the ENT,she said she will came to the office to show her A__ if we did not fill the prescription for her, I made her aware she can wait until Monday and she said "NO" someone else can up here can fill it for her, I advised that Dr.Tabori was the covering provider and she declined the script, the patient started to raise her voice and said Dr.Lowne prescribed it and she would never tell her she would not give her a refill. I explained again that Dr.Lowne was out of the office and would be more than [facility], the patient asked to speak with her nurse and I advised I was Dr.Lowne's assistant and I can not override the doctor. She said this is Bull___ and H/U        KP

## 2015-01-14 NOTE — Telephone Encounter (Signed)
Caller name:[name] [name] Relationship to patient:self Can be reached: Pharmacy:  Reason for call:Requesting refill of HYDROcodone-acetaminophen (NORCO/VICODIN) 5-325 MG tablet. Has taken all she has, is in a lot of pain. Is requesting high dose. Please advise

## 2015-01-14 NOTE — Telephone Encounter (Signed)
Spoke with patient and she stated she was told by the ENT that the punctured eardrum would 6-8 weeks to heal and at work she wears a headset so she is in pain and would like a refill on the hydrocodone. Patient was seen on 12/27/14 and Hydrocodone given on 01/02/14 #30. No UDS or contract. She has follow up scheduled at both places but ENT will not prescribe the Hydrocodone. Please advise.     KP

## 2015-01-14 NOTE — Telephone Encounter (Signed)
She just got hydrocodone 10 days ago (#30).  ENT are the experts and if they feel she does not need hydrocodone, I am not going to refill.  Dr Laury AxonLowne can reassess when she returns on Monday but no meds at this time

## 2015-01-17 ENCOUNTER — Encounter: Payer: Self-pay | Admitting: Family Medicine

## 2015-01-17 ENCOUNTER — Other Ambulatory Visit: Payer: Self-pay | Admitting: Family Medicine

## 2015-01-17 DIAGNOSIS — G43009 Migraine without aura, not intractable, without status migrainosus: Secondary | ICD-10-CM

## 2015-01-17 MED ORDER — RIZATRIPTAN BENZOATE 10 MG PO TABS: ORAL_TABLET | ORAL | Status: DC

## 2015-01-17 NOTE — Telephone Encounter (Signed)
PA approved effective from 01/13/2015 through 05/18/2015

## 2015-01-18 ENCOUNTER — Other Ambulatory Visit: Payer: Self-pay | Admitting: Family Medicine

## 2015-01-18 MED ORDER — HYDROCODONE-ACETAMINOPHEN 5-325 MG PO TABS: 1.0000 | ORAL_TABLET | Freq: Four times a day (QID) | ORAL | Status: DC | PRN

## 2015-01-18 NOTE — Telephone Encounter (Signed)
Last seen 12/27/14 and filled 01/03/15 #30   Please advise     KP

## 2015-01-19 ENCOUNTER — Telehealth: Payer: Self-pay | Admitting: Family Medicine

## 2015-01-19 NOTE — Telephone Encounter (Signed)
Patient dismissed from [facility] by Loreen FreudYvonne Lowne MD , effective January 17, 2015. Dismissal letter sent out by certified / registered mail.  DAJ  Received signed domestic return receipt verifying delivery of certified letter on January 25, 2015. Article number [id number] DAJ

## 2015-01-24 ENCOUNTER — Telehealth: Payer: Self-pay | Admitting: Family Medicine

## 2015-01-24 ENCOUNTER — Ambulatory Visit: Payer: BLUE CROSS/BLUE SHIELD | Admitting: Family Medicine

## 2015-01-26 NOTE — Telephone Encounter (Signed)
Pt was inadvertantly scheduled for appt 01/24/15 2:15pm for follow up, pt did not show, per notes pt was dismissed 01/19/15 but there is no flag on account that pt is dismissed, charge or no charge for 01/24/15.  [name] [name] - who adds the flag to the chart?

## 2015-01-27 NOTE — Telephone Encounter (Signed)
No charge. 

## 2015-02-18 ENCOUNTER — Emergency Department (HOSPITAL_BASED_OUTPATIENT_CLINIC_OR_DEPARTMENT_OTHER)
Admission: EM | Admit: 2015-02-18 | Discharge: 2015-02-19 | Disposition: A | Discharge status: Home / Self Care | Payer: BLUE CROSS/BLUE SHIELD | Attending: Emergency Medicine | Admitting: Emergency Medicine

## 2015-02-18 ENCOUNTER — Emergency Department (HOSPITAL_BASED_OUTPATIENT_CLINIC_OR_DEPARTMENT_OTHER): Payer: BLUE CROSS/BLUE SHIELD

## 2015-02-18 ENCOUNTER — Encounter (HOSPITAL_BASED_OUTPATIENT_CLINIC_OR_DEPARTMENT_OTHER): Payer: Self-pay | Admitting: *Deleted

## 2015-02-18 DIAGNOSIS — X58XXXA Exposure to other specified factors, initial encounter: Secondary | ICD-10-CM | POA: Insufficient documentation

## 2015-02-18 DIAGNOSIS — Z8679 Personal history of other diseases of the circulatory system: Secondary | ICD-10-CM | POA: Diagnosis not present

## 2015-02-18 DIAGNOSIS — M79602 Pain in left arm: Secondary | ICD-10-CM | POA: Diagnosis present

## 2015-02-18 DIAGNOSIS — Y929 Unspecified place or not applicable: Secondary | ICD-10-CM | POA: Insufficient documentation

## 2015-02-18 DIAGNOSIS — Y999 Unspecified external cause status: Secondary | ICD-10-CM | POA: Diagnosis not present

## 2015-02-18 DIAGNOSIS — Z8719 Personal history of other diseases of the digestive system: Secondary | ICD-10-CM | POA: Insufficient documentation

## 2015-02-18 DIAGNOSIS — Y939 Activity, unspecified: Secondary | ICD-10-CM | POA: Insufficient documentation

## 2015-02-18 DIAGNOSIS — S56912A Strain of unspecified muscles, fascia and tendons at forearm level, left arm, initial encounter: Secondary | ICD-10-CM | POA: Diagnosis not present

## 2015-02-18 DIAGNOSIS — F329 Major depressive disorder, single episode, unspecified: Secondary | ICD-10-CM | POA: Insufficient documentation

## 2015-02-18 DIAGNOSIS — Z8619 Personal history of other infectious and parasitic diseases: Secondary | ICD-10-CM | POA: Insufficient documentation

## 2015-02-18 DIAGNOSIS — Z87891 Personal history of nicotine dependence: Secondary | ICD-10-CM | POA: Insufficient documentation

## 2015-02-18 DIAGNOSIS — T148XXA Other injury of unspecified body region, initial encounter: Secondary | ICD-10-CM

## 2015-02-18 DIAGNOSIS — Z950 Presence of cardiac pacemaker: Secondary | ICD-10-CM | POA: Insufficient documentation

## 2015-02-18 DIAGNOSIS — F410 Panic disorder [episodic paroxysmal anxiety] without agoraphobia: Secondary | ICD-10-CM | POA: Diagnosis not present

## 2015-02-18 LAB — CBC WITH DIFFERENTIAL/PLATELET
BASOS PCT: 0 %
Basophils Absolute: 0 10*3/uL (ref 0.0–0.1)
EOS ABS: 0.1 10*3/uL (ref 0.0–0.7)
Eosinophils Relative: 1 %
HEMATOCRIT: 41.6 % (ref 36.0–46.0)
Hemoglobin: 13.7 g/dL (ref 12.0–15.0)
Lymphocytes Relative: 40 %
Lymphs Abs: 3.7 10*3/uL (ref 0.7–4.0)
MCH: 29.8 pg (ref 26.0–34.0)
MCHC: 32.9 g/dL (ref 30.0–36.0)
MCV: 90.4 fL (ref 78.0–100.0)
MONO ABS: 0.7 10*3/uL (ref 0.1–1.0)
Monocytes Relative: 8 %
NEUTROS ABS: 4.7 10*3/uL (ref 1.7–7.7)
Neutrophils Relative %: 51 %
Platelets: 298 10*3/uL (ref 150–400)
RBC: 4.6 MIL/uL (ref 3.87–5.11)
RDW: 13 % (ref 11.5–15.5)
WBC: 9.2 10*3/uL (ref 4.0–10.5)

## 2015-02-18 MED ORDER — KETOROLAC TROMETHAMINE 60 MG/2ML IM SOLN
INTRAMUSCULAR | Status: AC
  Filled 2015-02-18: qty 2

## 2015-02-18 MED ORDER — METHOCARBAMOL 500 MG PO TABS
ORAL_TABLET | ORAL | Status: AC
  Filled 2015-02-18: qty 2

## 2015-02-18 MED ORDER — KETOROLAC TROMETHAMINE 60 MG/2ML IM SOLN
60.0000 mg | Freq: Once | INTRAMUSCULAR | Status: AC
Start: 2015-02-18 — End: 2015-02-18
  Administered 2015-02-18: 60 mg via INTRAMUSCULAR

## 2015-02-18 MED ORDER — METHOCARBAMOL 500 MG PO TABS
1000.0000 mg | ORAL_TABLET | Freq: Once | ORAL | Status: AC
  Administered 2015-02-18: 1000 mg via ORAL

## 2015-02-18 NOTE — ED Provider Notes (Signed)
CSN: [id number]     Arrival date & time 02/18/15  2230 History  By signing my name below, I, Doreatha Martinva Mathews, attest that this documentation has been prepared under the direction and in the presence of Tisha Cline, MD. Electronically Signed: Doreatha MartinEva Mathews, ED Scribe. 02/18/2015. 11:23 PM.    Chief Complaint  Patient presents with  . Arm Pain   Patient is a 39 y.o. female presenting with chest pain. The history is provided by the patient. No language interpreter was used.  Chest Pain Pain location:  Substernal area Pain quality: aching   Pain radiates to:  L arm Pain radiates to the back: no   Pain severity:  Moderate Onset quality:  Gradual Duration:  2 days Timing:  Constant Progression:  Unchanged Chronicity:  New Context: movement   Relieved by:  None tried Worsened by:  Nothing tried Ineffective treatments:  None tried Associated symptoms: numbness   Associated symptoms: no fever   Risk factors comment:  Complete heart block   HPI Comments: [name] [name] is a 39 y.o. female who presents to the Emergency Department complaining of moderate, constant CP onset 2 days ago with associated left arm pain and numbness. Pt denies having any modifying factors. No recent heavy lifting, trauma, surgeries on the arm. She states arm pain is worsened with movement. She denies additional symptoms.   Past Medical History  Diagnosis Date  . Abnormal Pap smear 2004    colpo  . H/O candidiasis   . H/O varicella 2005  . Pelvic pain in female 2007  . Candida vaginitis 05/2006  . IBS (irritable bowel syndrome)   . 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 Depression   . Anxiety   . Panic attack   . Alcohol abuse    Past Surgical History  Procedure Laterality Date  . Tonsillectomy  1995  . Cholecystectomy    . Ep study and ablation  04/29/12    slow pathway ablation by  Dr Johney FrameAllred   . Pacemaker insertion  04/30/12    Medtronic Adapta L implanted by Dr Ladona Ridgelaylor for complete heart block  . Supraventricular tachycardia ablation N/A 04/29/2012    Procedure: SUPRAVENTRICULAR TACHYCARDIA ABLATION;  Surgeon: Hillis RangeJames Allred, MD;  [facility];  Service: Cardiovascular;  Laterality: N/A;  . Permanent pacemaker insertion N/A 04/30/2012    Procedure: PERMANENT PACEMAKER INSERTION;  Surgeon: Marinus MawGregg W Taylor, MD;  [facility];  Service: Cardiovascular;  Laterality: N/A;   Family History  Problem Relation Age of Onset  . Diabetes Mother   . Cancer Sister     Lump removed frfom shoulder  . Heart disease Maternal Grandfather     MI  . Cancer Paternal Grandmother     breast   Social History  Substance Use Topics  . Smoking status: Former Smoker -- 0.50 packs/day    Types: Cigarettes  . Smokeless tobacco: Never Used  . Alcohol Use: Yes   OB History    Gravida Para Term Preterm AB TAB SAB Ectopic Multiple Living   2 1 1       1      Review of Systems  Constitutional: Negative for fever.  Cardiovascular: Positive for chest pain.  Neurological: Positive for numbness.  All other systems reviewed and are negative.  Allergies  Review of patient's allergies indicates no known allergies.  Home Medications   Prior to Admission medications   Medication Sig Start Date End Date Taking? Authorizing Provider  clonazePAM (KLONOPIN) 0.5 MG tablet Take 0.5 mg by mouth 3 (three) times daily as needed for anxiety.   Yes Historical Provider, MD  ALPRAZolam Prudy Feeler) 0.5 MG tablet Take 1 tablet (0.5 mg total) by mouth at bedtime. 12/27/14   Lelon Perla, DO  Aspirin-Salicylamide-Caffeine (BC HEADACHE POWDER PO) Take 1 packet by mouth daily as needed (for headaches).    Historical Provider, MD  HYDROcodone-acetaminophen (NORCO/VICODIN) 5-325 MG tablet Take 1 tablet by mouth every 6 (six) hours as needed for moderate pain. 01/18/15   Lelon Perla, DO  JUNEL FE 1/20  1-20 MG-MCG tablet Take 1 tablet by mouth daily. 09/15/12   Historical Provider, MD  Liraglutide -Weight Management (SAXENDA) 18 MG/3ML SOPN Inject 3 mg into the skin daily. 12/27/14   Lelon Perla, DO  Multiple Vitamins-Minerals (MULTIVITAMIN) tablet Take 1 tablet by mouth daily. 05/24/14   Osvaldo Shipper, MD  rizatriptan (MAXALT) 10 MG tablet TAKE 1 TABLET (10 MG) BY MOUTH AS NEEDED FOR MIGRAINE. MAY REPEAT ONE TIME IN 2 HOURS IF NEEDED 01/17/15   Lelon Perla, DO  traMADol (ULTRAM) 50 MG tablet Take 1 tablet (50 mg total) by mouth every 6 (six) hours as needed for severe pain. 12/27/14   Grayling Congress Lowne, DO   BP 121/84 mmHg  Pulse 88  Temp(Src) 97.6 F (36.4 C)  Resp 16  Ht  (1.626 m)  Wt 187 lb (84.823 kg)  BMI 32.08 kg/m2  SpO2 100% Physical Exam  Constitutional: She is oriented to person, place, and time. She appears well-developed and well-nourished.  Smells of ETOH  HENT:  Head: Normocephalic and atraumatic.  Mouth/Throat: Oropharynx is clear and moist. No oropharyngeal exudate.  Eyes: Conjunctivae and EOM are normal. Pupils are equal, round, and reactive to light.  Neck: Trachea normal, normal range of motion, full passive range of motion without pain and phonation normal. No spinous process tenderness and no muscular tenderness present. Carotid bruit is not present. No rigidity. Normal range of motion present. No Brudzinski's sign and no Kernig's sign noted.  Cardiovascular: Normal rate, regular rhythm and normal heart sounds.  Exam reveals no gallop and no friction rub.   No murmur heard. Pulmonary/Chest: Effort normal and breath sounds normal. No respiratory distress. She has no wheezes. She has no rales.  Lungs CTA bilaterally.   Abdominal: Soft. Bowel sounds are normal. She exhibits no distension and no mass. There is no tenderness. There is no rebound and no guarding.  Musculoskeletal: Normal range of motion. She exhibits no edema or tenderness.       Left shoulder:  Normal.       Left elbow: Normal.       Left wrist: Normal.       Cervical back: Normal.       Thoracic back: Normal.  Left arm: well seated in the joint. Negative neer's test.   Neurological: She is alert and oriented to person, place, and time. She has normal strength. No sensory deficit. GCS eye subscore is 4. GCS verbal subscore is 5. GCS motor subscore is 6.  Reflex Scores:      Tricep reflexes are 2+ on the right side and 2+ on the left side.      Bicep reflexes are 2+ on the right side and 2+ on the left side.      Brachioradialis reflexes are 2+  on the right side and 2+ on the left side.      Patellar reflexes are 2+ on the right side and 2+ on the left side.      Achilles reflexes are 2+ on the right side and 2+ on the left side. Completely examined with scribe present: Sensation and motor intact to all nerve distributions of the LUE.    Skin: Skin is warm and dry.  Psychiatric: She has a normal mood and affect. Her behavior is normal.  Nursing note and vitals reviewed.  ED Course  Procedures (including critical care time) DIAGNOSTIC STUDIES: Oxygen Saturation is 100% on RA, normal by my interpretation.    COORDINATION OF CARE: 11:23 PM Discussed treatment plan with pt at bedside and pt agreed to plan. Will XR chest and shoulder.    Labs Review Labs Reviewed - No data to display  Imaging Review No results found. I have personally reviewed and evaluated these images and lab results as part of my medical decision-making.   EKG Interpretation   Date/Time:  Friday February 18 2015 23:12:36 EST Ventricular Rate:  78 PR Interval:  120 QRS Duration: 148 QT Interval:  446 QTC Calculation: 508 R Axis:   -123 Text Interpretation:  Atrial-sensed ventricular-paced rhythm Confirmed by  [facility]  MD, Cleotis Sparr ([id number]) on 02/18/2015 11:17:43 PM      MDM   Final diagnoses:  None    Wells 0.   Patient recently discharged by her PMD for using profanity when the PMD  refused to refill her narcotics.  Ruled out for MI with negative EKG and troponin and > 24 hours of pain.  No SOB.  No cough.  Highly doubt PE.  I suspect this is drug seeking behavior (see nursing note) and in the setting of polysubstance overdose I do not feel that it is in the patient's best interest.  Will d/c with muscle relaxant.    I personally performed the services described in this documentation, which was scribed in my presence. The recorded information has been reviewed and is accurate.     Cy Blamer, MD 02/19/15 [phone]

## 2015-02-18 NOTE — ED Notes (Addendum)
Obvious ETOH Pt c/o left arm pain  And states unable to move arm and states cant feel left arm. Pt raised left arm for BP cuff and removed shirt overhead with both arms  states she was dismissed from PMD d/t "cussed out my doctor."

## 2015-02-18 NOTE — ED Notes (Signed)
amb to BR 

## 2015-02-19 LAB — BASIC METABOLIC PANEL
ANION GAP: 11 (ref 5–15)
BUN: 8 mg/dL (ref 6–20)
CALCIUM: 9.7 mg/dL (ref 8.9–10.3)
CHLORIDE: 110 mmol/L (ref 101–111)
CO2: 21 mmol/L — AB (ref 22–32)
Creatinine, Ser: 0.71 mg/dL (ref 0.44–1.00)
GFR calc non Af Amer: >60 mL/min (ref >60)
Glucose, Bld: 89 mg/dL (ref 65–99)
Potassium: 4.2 mmol/L (ref 3.5–5.1)
SODIUM: 142 mmol/L (ref 135–145)

## 2015-02-19 LAB — TROPONIN I

## 2015-02-19 MED ORDER — METHOCARBAMOL 500 MG PO TABS: 500.0000 mg | ORAL_TABLET | Freq: Two times a day (BID) | ORAL | Status: DC

## 2015-02-19 MED ORDER — LIDOCAINE 5 % EX PTCH: 1.0000 | MEDICATED_PATCH | CUTANEOUS | Status: DC

## 2015-02-19 NOTE — ED Notes (Signed)
Pt states " i want some morphine, why cant i have any, i have insurance and pay my copay i should be able to get what i want"

## 2015-02-19 NOTE — ED Notes (Signed)
amb to BR w/o difficulty 

## 2015-03-01 ENCOUNTER — Telehealth: Payer: Self-pay | Admitting: Family Medicine

## 2015-03-01 NOTE — Telephone Encounter (Signed)
Pt called in stating she has shingles and has been to urgent care 2x and ER 1x. Pt stating she needs appt with PCP. Pt aware of dismissal and states she left a msg for [place] to dispute as her estranged husband signed for the certified letter. Advised pt that she is well past 33 days and not an emergency. Pt was upset and hung up on me.

## 2015-03-21 MED FILL — SAXENDA 18 MG/3 ML PEN: 18 | 30 days supply | Qty: 15 | Fill #2

## 2015-03-23 ENCOUNTER — Ambulatory Visit (INDEPENDENT_AMBULATORY_CARE_PROVIDER_SITE_OTHER): Payer: BLUE CROSS/BLUE SHIELD | Admitting: Internal Medicine

## 2015-03-23 ENCOUNTER — Encounter: Payer: Self-pay | Admitting: Internal Medicine

## 2015-03-23 VITALS — BP 124/82 | HR 97 | Ht 64.0 in | Wt 188.4 lb

## 2015-03-23 DIAGNOSIS — I442 Atrioventricular block, complete: Secondary | ICD-10-CM | POA: Diagnosis not present

## 2015-03-23 NOTE — Patient Instructions (Signed)
Medication Instructions:  Your physician recommends that you continue on your current medications as directed. Please refer to the Current Medication list given to you today.   Labwork: None ordered   Testing/Procedures: None ordered   Follow-Up: Your physician wants you to follow-up in: 12 months with [name] BalsamAmber Seiler, NP. You will receive a reminder letter in the mail two months in advance. If you don't receive a letter, please call our office to schedule the follow-up appointment.  Remote monitoring is used to monitor your Pacemaker from home. This monitoring reduces the number of office visits required to check your device to one time per year. It allows us to keep an eye on the functioning of your device to ensure it is working properly. You are scheduled for a device check from home on 06/22/15. You may send your transmission at any time that day. If you have a wireless device, the transmission will be sent automatically. After your physician reviews your transmission, you will receive a postcard with your next transmission date.    Any Other Special Instructions Will Be Listed Below (If Applicable).     If you need a refill on your cardiac medications before your next appointment, please call your pharmacy.

## 2015-03-23 NOTE — Progress Notes (Signed)
[name] [name] is a 40 y.o. female who presents today for electrophysiology followup.  She continues to have chronic difficulty with anxiety, though this seems somewhat better today.  Unfortunately, she has been fired by Dr Laury Axon as a patient. She is doing very well from a CV standpoint.  States "my heart is fine".  Denies fatigue and reduced exercise tolerance.   Today, she denies symptoms of palpitations, chest pain, shortness of breath, or further syncope. The patient is otherwise without complaint today.   Past Medical History  Diagnosis Date  . Abnormal Pap smear 2004    colpo  . H/O candidiasis   . H/O varicella 2005  . Pelvic pain in female 2007  . Candida vaginitis 05/2006  . IBS (irritable bowel syndrome)   . 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 Depression   . Anxiety   . Panic attack   . Alcohol abuse    Past Surgical History  Procedure Laterality Date  . Tonsillectomy  1995  . Cholecystectomy    . Ep study and ablation  04/29/12    slow pathway ablation by Dr Johney Frame   . Pacemaker insertion  04/30/12    Medtronic Adapta L implanted by Dr Ladona Ridgel for complete heart block  . Supraventricular tachycardia ablation N/A 04/29/2012    Procedure: SUPRAVENTRICULAR TACHYCARDIA ABLATION;  Surgeon: Hillis Range, MD;  [facility];  Service: Cardiovascular;  Laterality: N/A;  . Permanent pacemaker insertion N/A 04/30/2012    Procedure: PERMANENT PACEMAKER INSERTION;  Surgeon: Marinus Maw, MD;  Location: [facility] CATH LAB;  Service: Cardiovascular;  Laterality: N/A;    Current Outpatient Prescriptions  Medication Sig Dispense Refill  . ALPRAZolam (XANAX) 0.5 MG tablet Take 1 tablet (0.5 mg total) by mouth at bedtime. 30 tablet 2  . Aspirin-Salicylamide-Caffeine (BC HEADACHE POWDER PO) Take 1 packet by mouth daily as needed (for headaches).    . clonazePAM  (KLONOPIN) 0.5 MG tablet Take 0.5 mg by mouth 3 (three) times daily as needed for anxiety.    Colleen Can FE 1/20 1-20 MG-MCG tablet Take 1 tablet by mouth daily.    Marland Kitchen lidocaine (LIDODERM) 5 % Place 1 patch onto the skin daily. Remove & Discard patch within 12 hours or as directed by MD 7 patch 0  . Liraglutide -Weight Management (SAXENDA) 18 MG/3ML SOPN Inject 3 mg into the skin daily. 5 pen 5  . Multiple Vitamins-Minerals (MULTIVITAMIN) tablet Take 1 tablet by mouth daily. 30 tablet 0  . rizatriptan (MAXALT) 10 MG tablet TAKE 1 TABLET (10 MG) BY MOUTH AS NEEDED FOR MIGRAINE. MAY REPEAT ONE TIME IN 2 HOURS IF NEEDED 30 tablet 1   No current facility-administered medications for this visit.    Physical Exam: Filed Vitals:   03/23/15 1508  BP: 124/82  Pulse: 97  Height: 5\' 4"  (1.626 m)  Weight: 188 lb 6.4 oz (85.458 kg)    GEN- The patient is well appearing, NAD  Head- normocephalic, atraumatic Eyes-  Sclera clear, conjunctiva pink Ears- hearing intact Oropharynx- clear Lungs- Clear to ausculation bilaterally, normal work of breathing Chest- pacemaker pocket is well healed Heart- Regular rate and rhythm, no murmurs, rubs or gallops, PMI not laterally displaced GI- soft, NT, ND, + BS Extremities- no clubbing, cyanosis, or edema, arm swelling is resolved  Pacemaker interrogation- reviewed in detail today,  See PACEART  report  Assessment and Plan:  1. Complete heart block Normal pacemaker function See [name] Art report Doing very well  2. Palpitations- resolved   3. Tobacco/ ETOH- I am encouraged by her lifestyle changes.  She states that she is not drinking or smoking presently  4. Postural dizziness  resolved   Carelink every 3 months (MyCarelink Smart)  Return to see EP NP in 1 year  Hillis RangeJames Staton Markey MD, [facility] 03/23/2015 3:36 PM

## 2015-03-24 MED FILL — HYDROCODON-APAP 5-325: 5-325 | 10 days supply | Qty: 40 | Fill #0

## 2015-04-09 LAB — CUP PACEART INCLINIC DEVICE CHECK
Battery Voltage: 2.79 V
Brady Statistic AP VS Percent: 0 %
Brady Statistic AS VP Percent: 97 %
Implantable Lead Implant Date: 20140219
Implantable Lead Model: 5076
Lead Channel Impedance Value: 555 Ohm
Lead Channel Pacing Threshold Amplitude: 0.75 V
Lead Channel Pacing Threshold Pulse Width: 0.4 ms
Lead Channel Pacing Threshold Pulse Width: 0.46 ms
Lead Channel Setting Pacing Amplitude: 2 V
Lead Channel Setting Sensing Sensitivity: 2.8 mV
MDC IDC LEAD IMPLANT DT: 20140219
MDC IDC LEAD LOCATION: 753859
MDC IDC LEAD LOCATION: 753860
MDC IDC MSMT BATTERY IMPEDANCE: 229 Ohm
MDC IDC MSMT BATTERY REMAINING LONGEVITY: 106 mo
MDC IDC MSMT LEADCHNL RA IMPEDANCE VALUE: 610 Ohm
MDC IDC MSMT LEADCHNL RA PACING THRESHOLD AMPLITUDE: 0.75 V
MDC IDC MSMT LEADCHNL RA SENSING INTR AMPL: 4 mV
MDC IDC MSMT LEADCHNL RV SENSING INTR AMPL: 11.2 mV
MDC IDC SESS DTM: 20170111201447
MDC IDC SET LEADCHNL RV PACING AMPLITUDE: 2.5 V
MDC IDC SET LEADCHNL RV PACING PULSEWIDTH: 0.46 ms
MDC IDC STAT BRADY AP VP PERCENT: 3 %
MDC IDC STAT BRADY AS VS PERCENT: 0 %

## 2015-04-19 MED FILL — SAXENDA 18 MG/3 ML PEN: 18 | 30 days supply | Qty: 15 | Fill #3

## 2015-04-20 MED FILL — LORazepam 0.5 MG TABS: 0.5 | 30 days supply | Qty: 90 | Fill #0

## 2015-04-21 MED FILL — LARIN FE 1-20 TABLET: 1-20 | 84 days supply | Qty: 84 | Fill #2

## 2015-04-26 MED FILL — ULTICARE PEN NDL 8MM 31G: 31G X 8 MM | 90 days supply | Qty: 100 | Fill #1

## 2015-05-25 ENCOUNTER — Encounter: Payer: Self-pay | Admitting: Family Medicine

## 2015-05-30 MED FILL — ALPRAZolam 1 MG TABS: 1 | 30 days supply | Qty: 60 | Fill #0

## 2015-06-08 MED FILL — PHENTERMINE 37.5 MG TABLET: 37.5 | 30 days supply | Qty: 30 | Fill #0

## 2015-06-10 ENCOUNTER — Encounter (HOSPITAL_COMMUNITY): Payer: Self-pay | Admitting: Emergency Medicine

## 2015-06-10 ENCOUNTER — Emergency Department (HOSPITAL_COMMUNITY)
Admission: EM | Admit: 2015-06-10 | Discharge: 2015-06-10 | Disposition: A | Discharge status: Home / Self Care | Payer: BLUE CROSS/BLUE SHIELD | Attending: Physician Assistant | Admitting: Physician Assistant

## 2015-06-10 DIAGNOSIS — Z3202 Encounter for pregnancy test, result negative: Secondary | ICD-10-CM | POA: Insufficient documentation

## 2015-06-10 DIAGNOSIS — Z950 Presence of cardiac pacemaker: Secondary | ICD-10-CM | POA: Insufficient documentation

## 2015-06-10 DIAGNOSIS — R4789 Other speech disturbances: Secondary | ICD-10-CM | POA: Insufficient documentation

## 2015-06-10 DIAGNOSIS — F1310 Sedative, hypnotic or anxiolytic abuse, uncomplicated: Secondary | ICD-10-CM | POA: Diagnosis not present

## 2015-06-10 DIAGNOSIS — Z8619 Personal history of other infectious and parasitic diseases: Secondary | ICD-10-CM | POA: Insufficient documentation

## 2015-06-10 DIAGNOSIS — I252 Old myocardial infarction: Secondary | ICD-10-CM | POA: Insufficient documentation

## 2015-06-10 DIAGNOSIS — Z8719 Personal history of other diseases of the digestive system: Secondary | ICD-10-CM | POA: Diagnosis not present

## 2015-06-10 DIAGNOSIS — Z79899 Other long term (current) drug therapy: Secondary | ICD-10-CM | POA: Insufficient documentation

## 2015-06-10 DIAGNOSIS — F329 Major depressive disorder, single episode, unspecified: Secondary | ICD-10-CM | POA: Diagnosis not present

## 2015-06-10 DIAGNOSIS — R110 Nausea: Secondary | ICD-10-CM | POA: Diagnosis not present

## 2015-06-10 DIAGNOSIS — R51 Headache: Secondary | ICD-10-CM | POA: Insufficient documentation

## 2015-06-10 DIAGNOSIS — F410 Panic disorder [episodic paroxysmal anxiety] without agoraphobia: Secondary | ICD-10-CM | POA: Insufficient documentation

## 2015-06-10 DIAGNOSIS — Z87891 Personal history of nicotine dependence: Secondary | ICD-10-CM | POA: Insufficient documentation

## 2015-06-10 DIAGNOSIS — R519 Headache, unspecified: Secondary | ICD-10-CM

## 2015-06-10 LAB — URINALYSIS, ROUTINE W REFLEX MICROSCOPIC
BILIRUBIN URINE: NEGATIVE
Glucose, UA: NEGATIVE mg/dL
KETONES UR: NEGATIVE mg/dL
NITRITE: NEGATIVE
PROTEIN: NEGATIVE mg/dL
SPECIFIC GRAVITY, URINE: 1.005 (ref 1.005–1.030)
pH: 5.5 (ref 5.0–8.0)

## 2015-06-10 LAB — BASIC METABOLIC PANEL
Anion gap: 13 (ref 5–15)
BUN: 14 mg/dL (ref 6–20)
CALCIUM: 10.1 mg/dL (ref 8.9–10.3)
CHLORIDE: 108 mmol/L (ref 101–111)
CO2: 19 mmol/L — ABNORMAL LOW (ref 22–32)
CREATININE: 0.73 mg/dL (ref 0.44–1.00)
Glucose, Bld: 87 mg/dL (ref 65–99)
Potassium: 4.6 mmol/L (ref 3.5–5.1)
SODIUM: 140 mmol/L (ref 135–145)

## 2015-06-10 LAB — CBC WITH DIFFERENTIAL/PLATELET
BASOS PCT: 0 %
Basophils Absolute: 0 10*3/uL (ref 0.0–0.1)
EOS ABS: 0.1 10*3/uL (ref 0.0–0.7)
EOS PCT: 1 %
HCT: 40.8 % (ref 36.0–46.0)
HEMOGLOBIN: 13.5 g/dL (ref 12.0–15.0)
Lymphocytes Relative: 27 %
Lymphs Abs: 3 10*3/uL (ref 0.7–4.0)
MCH: 29.6 pg (ref 26.0–34.0)
MCHC: 33.1 g/dL (ref 30.0–36.0)
MCV: 89.5 fL (ref 78.0–100.0)
MONOS PCT: 7 %
Monocytes Absolute: 0.7 10*3/uL (ref 0.1–1.0)
NEUTROS PCT: 65 %
Neutro Abs: 7.1 10*3/uL (ref 1.7–7.7)
PLATELETS: 305 10*3/uL (ref 150–400)
RBC: 4.56 MIL/uL (ref 3.87–5.11)
RDW: 13.2 % (ref 11.5–15.5)
WBC: 10.9 10*3/uL — AB (ref 4.0–10.5)

## 2015-06-10 LAB — PREGNANCY, URINE: PREG TEST UR: NEGATIVE

## 2015-06-10 LAB — URINE MICROSCOPIC-ADD ON

## 2015-06-10 LAB — ETHANOL: Alcohol, Ethyl (B): <5 mg/dL (ref <5)

## 2015-06-10 LAB — RAPID URINE DRUG SCREEN, HOSP PERFORMED
Amphetamines: NOT DETECTED
Barbiturates: NOT DETECTED
Benzodiazepines: POSITIVE — AB
COCAINE: NOT DETECTED
OPIATES: NOT DETECTED
Tetrahydrocannabinol: NOT DETECTED

## 2015-06-10 MED ORDER — DEXAMETHASONE SODIUM PHOSPHATE 10 MG/ML IJ SOLN
10.0000 mg | Freq: Once | INTRAMUSCULAR | Status: AC
Start: 2015-06-10 — End: 2015-06-10
  Administered 2015-06-10: 10 mg via INTRAVENOUS
  Filled 2015-06-10: qty 1

## 2015-06-10 MED ORDER — DIPHENHYDRAMINE HCL 50 MG/ML IJ SOLN
25.0000 mg | Freq: Once | INTRAMUSCULAR | Status: AC
  Administered 2015-06-10: 25 mg via INTRAVENOUS
  Filled 2015-06-10: qty 1

## 2015-06-10 MED ORDER — PROCHLORPERAZINE EDISYLATE 5 MG/ML IJ SOLN
10.0000 mg | Freq: Once | INTRAMUSCULAR | Status: AC
  Administered 2015-06-10: 10 mg via INTRAVENOUS
  Filled 2015-06-10: qty 2

## 2015-06-10 NOTE — ED Provider Notes (Signed)
CSN: [id number]     Arrival date & time 06/10/15  1846 History   First MD Initiated Contact with Patient 06/10/15 1914     Chief Complaint  Patient presents with  . Headache  . Fatigue     (Consider location/radiation/quality/duration/timing/severity/associated sxs/prior Treatment) Patient is a 40 y.o. female presenting with headaches. The history is provided by the patient and a significant other.  Headache Pain location:  Frontal Quality:  Sharp Radiates to:  Does not radiate Severity currently:  10/10 Severity at highest:  10/10 Onset quality:  Gradual (started at noon then gradually worsened over muliple hour periods till peak at 6:45pm) Duration:  9 hours Timing:  Constant Progression:  Worsening Chronicity:  New Relieved by:  Nothing Worsened by:  Nothing Ineffective treatments:  None tried Associated symptoms: nausea   Associated symptoms: no abdominal pain, no back pain, no cough, no dizziness, no eye pain, no fatigue, no fever, no myalgias, no photophobia, no sore throat, no vomiting and no weakness     Past Medical History  Diagnosis Date  . Abnormal Pap smear 2004    colpo  . H/O candidiasis   . H/O varicella 2005  . Pelvic pain in female 2007  . Candida vaginitis 05/2006  . IBS (irritable bowel syndrome)   . 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 Depression   . Anxiety   . Panic attack   . Alcohol abuse    Past Surgical History  Procedure Laterality Date  . Tonsillectomy  1995  . Cholecystectomy    . Ep study and ablation  04/29/12    slow pathway ablation by Dr Johney FrameAllred   . Pacemaker insertion  04/30/12    Medtronic Adapta L implanted by Dr Ladona Ridgelaylor for complete heart block  . Supraventricular tachycardia ablation N/A 04/29/2012    Procedure: SUPRAVENTRICULAR TACHYCARDIA ABLATION;  Surgeon: Hillis RangeJames Allred, MD;  [facility];   Service: Cardiovascular;  Laterality: N/A;  . Permanent pacemaker insertion N/A 04/30/2012    Procedure: PERMANENT PACEMAKER INSERTION;  Surgeon: Marinus MawGregg W Taylor, MD;  [facility];  Service: Cardiovascular;  Laterality: N/A;   Family History  Problem Relation Age of Onset  . Diabetes Mother   . Cancer Sister     Lump removed frfom shoulder  . Heart disease Maternal Grandfather     MI  . Cancer Paternal Grandmother     breast   Social History  Substance Use Topics  . Smoking status: Former Smoker -- 0.50 packs/day    Types: Cigarettes  . Smokeless tobacco: Never Used  . Alcohol Use: Yes   OB History    Gravida Para Term Preterm AB TAB SAB Ectopic Multiple Living   2 1 1       1      Review of Systems  Constitutional: Negative for fever, chills, diaphoresis, activity change, appetite change and fatigue.  HENT: Negative for facial swelling, rhinorrhea, sore throat, trouble swallowing and voice change.   Eyes: Negative for photophobia, pain and visual disturbance.  Respiratory: Negative for cough, shortness of breath, wheezing and stridor.   Cardiovascular: Negative for chest pain, palpitations and leg swelling.  Gastrointestinal: Positive for nausea. Negative for vomiting, abdominal pain, constipation and anal bleeding.  Endocrine: Negative.   Genitourinary: Negative for dysuria, vaginal bleeding, vaginal discharge and vaginal pain.  Musculoskeletal: Negative for myalgias, back  pain and arthralgias.  Skin: Negative.  Negative for rash.  Allergic/Immunologic: Negative.   Neurological: Positive for speech difficulty and headaches. Negative for dizziness, tremors, syncope and weakness.  Psychiatric/Behavioral: Negative for suicidal ideas, sleep disturbance and self-injury.  All other systems reviewed and are negative.     Allergies  Review of patient's allergies indicates no known allergies.  Home Medications   Prior to Admission medications   Medication Sig Start  Date End Date Taking? Authorizing Provider  ALPRAZolam Prudy Feeler) 0.5 MG tablet Take 1 tablet (0.5 mg total) by mouth at bedtime. Patient taking differently: Take 0.5 mg by mouth daily as needed for anxiety.  12/27/14  Yes Yvonne R Lowne Chase, DO  Aspirin-Salicylamide-Caffeine (BC HEADACHE POWDER PO) Take 1 packet by mouth daily as needed (for headaches).   Yes Historical Provider, MD  JUNEL FE 1/20 1-20 MG-MCG tablet Take 1 tablet by mouth daily. 09/15/12  Yes Historical Provider, MD  Multiple Vitamins-Minerals (MULTIVITAMIN) tablet Take 1 tablet by mouth daily. 05/24/14  Yes Osvaldo Shipper, MD  PRESCRIPTION MEDICATION Take 1 tablet by mouth at bedtime. Sleep medication   Yes Historical Provider, MD  rizatriptan (MAXALT) 10 MG tablet TAKE 1 TABLET (10 MG) BY MOUTH AS NEEDED FOR MIGRAINE. MAY REPEAT ONE TIME IN 2 HOURS IF NEEDED 01/17/15  Yes Yvonne R Lowne Chase, DO   BP 122/87 mmHg  Pulse 88  Temp(Src) 97.8 F (36.6 C) (Oral)  Resp 24  SpO2 99% Physical Exam  Constitutional: She is oriented to person, place, and time. She appears well-developed and well-nourished. No distress.  HENT:  Head: Normocephalic and atraumatic.  Right Ear: External ear normal.  Left Ear: External ear normal.  Mouth/Throat: No oropharyngeal exudate.  Eyes: Conjunctivae and EOM are normal. Pupils are equal, round, and reactive to light. No scleral icterus.  Neck: Normal range of motion. Neck supple. No JVD present. No tracheal deviation present. No thyromegaly present.  Cardiovascular: Normal rate, regular rhythm and intact distal pulses.   Pulmonary/Chest: Effort normal and breath sounds normal. No respiratory distress. She has no wheezes. She has no rales.  Abdominal: Soft. Bowel sounds are normal. She exhibits no distension. There is no tenderness.  Musculoskeletal: Normal range of motion. She exhibits no edema or tenderness.  Neurological: She is alert and oriented to person, place, and time. No cranial nerve  deficit. She exhibits normal muscle tone. Coordination normal.  Stuttering speech but no slurring. No facial asymmetry. 5/5 strength in all 4 extremities. Sensation intact in all extremities.   Skin: Skin is warm and dry. She is not diaphoretic. No pallor.  Psychiatric: She has a normal mood and affect. She expresses no homicidal and no suicidal ideation. She expresses no suicidal plans and no homicidal plans.  Nursing note and vitals reviewed.   ED Course  Procedures (including critical care time) Labs Review Labs Reviewed  CBC WITH DIFFERENTIAL/PLATELET - Abnormal; Notable for the following:    WBC 10.9 (*)    All other components within normal limits  BASIC METABOLIC PANEL - Abnormal; Notable for the following:    CO2 19 (*)    All other components within normal limits  URINALYSIS, ROUTINE W REFLEX MICROSCOPIC (NOT AT [facility]) - Abnormal; Notable for the following:    Hgb urine dipstick MODERATE (*)    Leukocytes, UA TRACE (*)    All other components within normal limits  URINE RAPID DRUG SCREEN, HOSP PERFORMED - Abnormal; Notable for the following:    Benzodiazepines POSITIVE (*)  All other components within normal limits  URINE MICROSCOPIC-ADD ON - Abnormal; Notable for the following:    Squamous Epithelial / LPF 0-5 (*)    Bacteria, UA RARE (*)    All other components within normal limits  URINE CULTURE  ETHANOL  PREGNANCY, URINE    Imaging Review No results found. I have personally reviewed and evaluated these images and lab results as part of my medical decision-making.   EKG Interpretation None      MDM   Final diagnoses:  Frontal headache    The patient is a 40 year old female with a history of alcohol abuse and panic attacks who presents with frontal headache starting at noon today which gradually worsened over multiple hours and peaked at 6:45 PM. Patient denies to me that the headache was sudden or maximal at onset. She has developed nausea and stuttering  speech. Remainder of exam normal as above. I have recommended laboratory evaluation and head CT to the patient given her neurologic symptoms. She is also given medications to improve her headache. After medications given patient reports immediate complete relief of all of her symptoms. Repeat physical exams normal neurologic exam with normal speech. Patient at this time declines head CT and requests to be discharged. I discussed with her alternative diagnoses including subarachnoid hemorrhage, dural sinus thrombosis, meningitis, intracranial mass. The patient understands these risks and states she still prefers not to have head CT. She states she is comfortable following up with her primary care doctor on Monday and return to emergency department immediately her symptoms worsen. She is able discussed risks benefits and alternatives to these diagnostic tests therefore will choose capacity to make this decision. Patient is discharged with strict ED return precautions.  Patient seen with attending, Dr. Juliann Pares, who oversaw clinical decision making.    Lula Olszewski, MD 06/10/15 2202  Courteney Randall An, MD 06/10/15 2322

## 2015-06-10 NOTE — ED Notes (Signed)
Patient ambulatory to restroom with steady gait.

## 2015-06-10 NOTE — ED Notes (Addendum)
Patient states that she is feeling much better than when she came in and wants to know if she can just go so she may go home and eat. Explained to patient that the MD is in another room and this RN would relay that information to the doctor as soon as possible.

## 2015-06-10 NOTE — ED Notes (Signed)
Patient verbalized understanding of discharge instructions and denies any further needs or questions at this time. VS stable. Patient ambulatory with steady gait.  

## 2015-06-10 NOTE — ED Notes (Signed)
PER [facility]: Patient to ED from home c/o sudden onset H/A at noon and progressing weakness/fatigue since then. Patient also states that she's had nausea and "can think of stuff but can't get it out." Patient appears to be stuttering and getting upset that she can't speak what is on her mind fluently. Per EMS, no LOC. Patient ambulatory to restroom and back. Strength equal in all extremities. EMS VS: 143/110 (abnormal for pt), 97%RA, HR 96 dual-paced Medtronic, CBG 92. Patient still c/o headache, speech is forced but clear, A&O x 4.

## 2015-06-12 LAB — URINE CULTURE: Culture: 5000

## 2015-06-22 ENCOUNTER — Telehealth: Payer: Self-pay | Admitting: Cardiology

## 2015-06-22 ENCOUNTER — Ambulatory Visit: Payer: BLUE CROSS/BLUE SHIELD | Admitting: *Deleted

## 2015-06-22 NOTE — Telephone Encounter (Signed)
Spoke with pt and reminded pt of remote transmission that is due today. Pt verbalized understanding.   

## 2015-06-27 ENCOUNTER — Encounter: Payer: Self-pay | Admitting: Cardiology

## 2015-07-05 MED FILL — ALPRAZolam 1 MG TABS: 1 | 30 days supply | Qty: 60 | Fill #0

## 2015-08-03 MED FILL — ALPRAZolam 1 MG TABS: 1 | 30 days supply | Qty: 60 | Fill #0

## 2015-08-03 MED FILL — NORETHIN-ESTRAD-FERR 1-0.02: 1-20 | 84 days supply | Qty: 84 | Fill #3

## 2015-08-11 MED FILL — PHENTERMINE 37.5 MG TABLET: 37.5 | 30 days supply | Qty: 30 | Fill #0

## 2015-08-31 MED FILL — ZOLPIDEM TARTRATE 10 MG TAB: 10 | 30 days supply | Qty: 30 | Fill #0

## 2015-09-05 MED FILL — ALPRAZolam 1 MG TABS: 1 | 30 days supply | Qty: 60 | Fill #1

## 2015-10-03 MED FILL — ALPRAZolam 1 MG TABS: 1 | 30 days supply | Qty: 60 | Fill #2

## 2015-10-03 MED FILL — ZOLPIDEM TARTRATE 10 MG TAB: 10 | 30 days supply | Qty: 30 | Fill #1

## 2015-10-06 IMAGING — US US OB COMP LESS 14 WK
1 series · 13 of 28 positions shown · non-contrast
Comparison: None.

CLINICAL DATA: 38-year-old female with positive home pregnancy
test. Urine pregnancy test in the ER was negative. Patient
presenting with vaginal bleeding

EXAM:
OBSTETRIC <14 WK US AND TRANSVAGINAL OB US
TECHNIQUE: Both transabdominal and transvaginal ultrasound examinations were
performed for complete evaluation of the gestation as well as the
maternal uterus, adnexal regions, and pelvic cul-de-sac.
Transvaginal technique was performed to assess early pregnancy.

[Series 1: us ob comp less 14 wk · 0.24mm/px · 86 acquisitions, 13 frames shown]
[im 4/86]
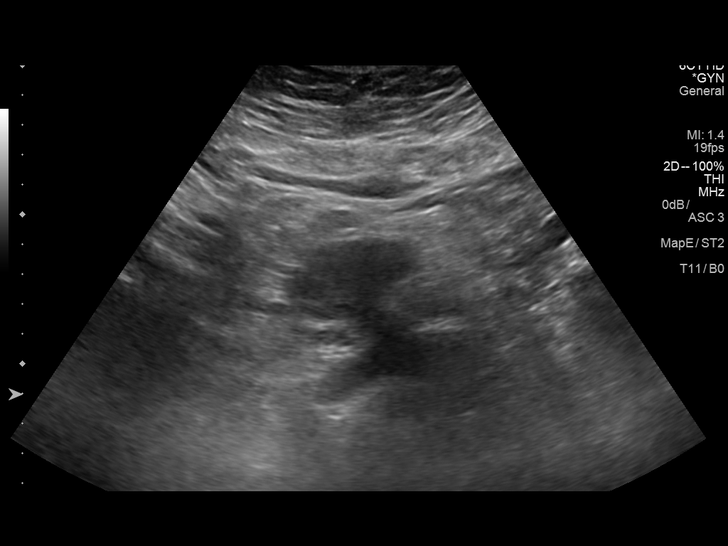
[im 10/86]
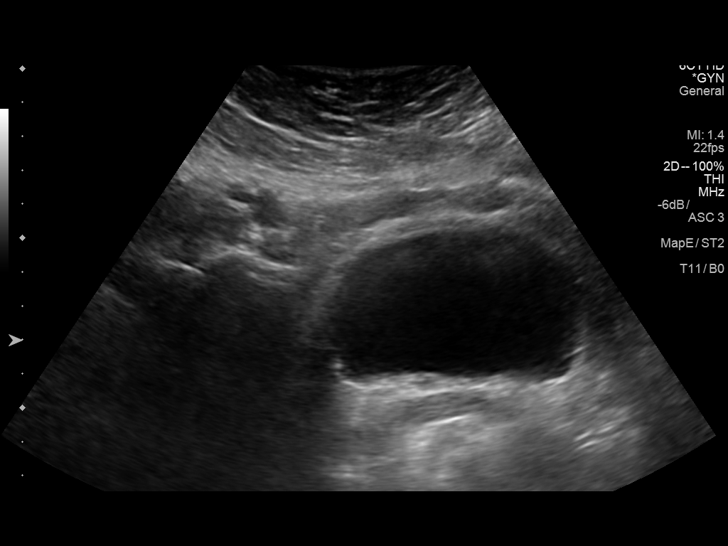
[im 16/86]
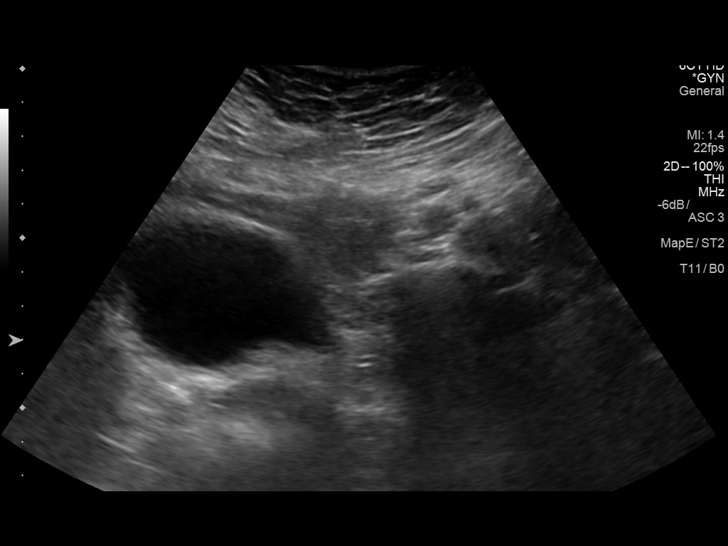
[im 23/86]
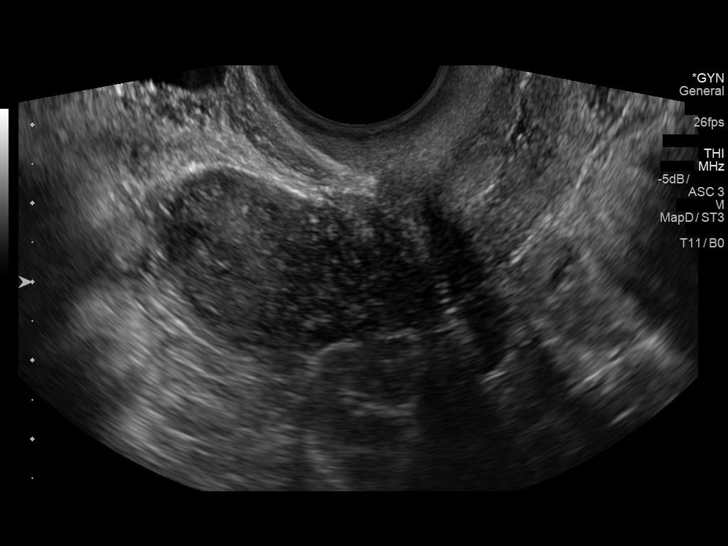
[im 29/86]
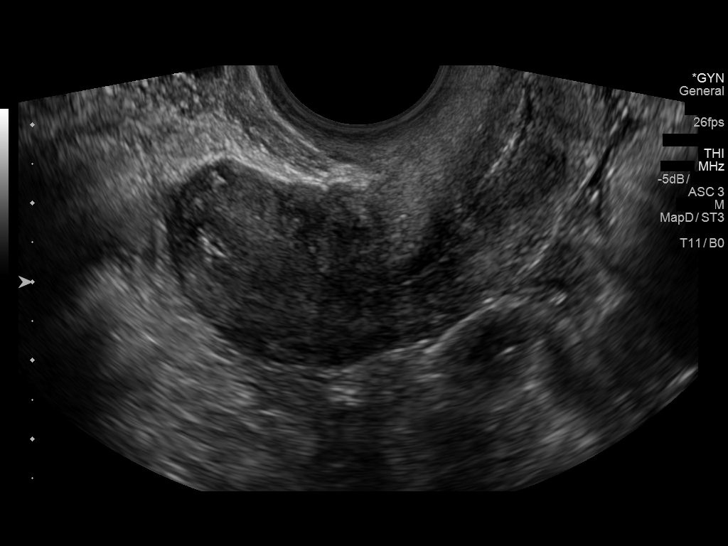
[im 35/86]
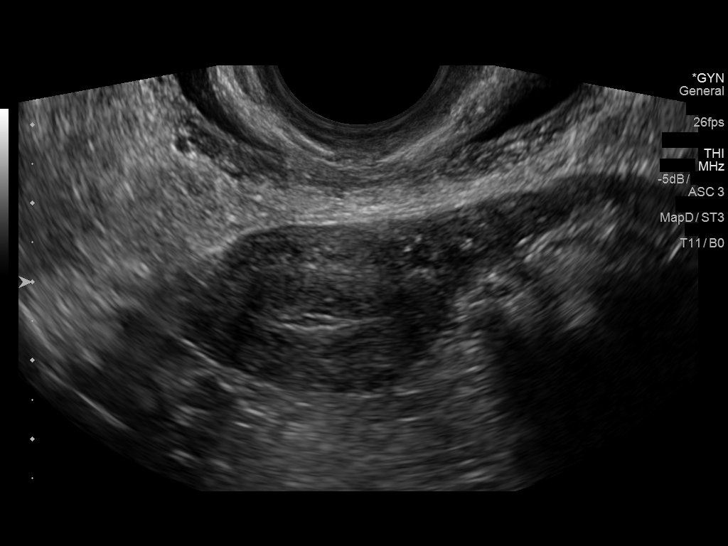
[im 45/86]
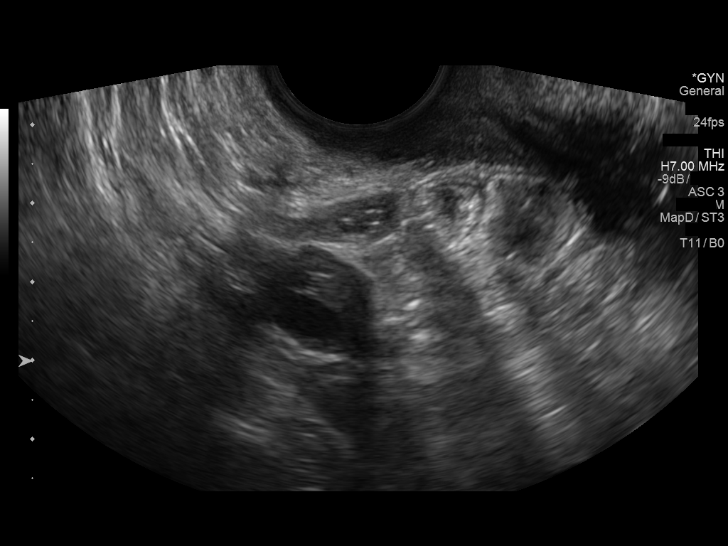
[im 51/86]
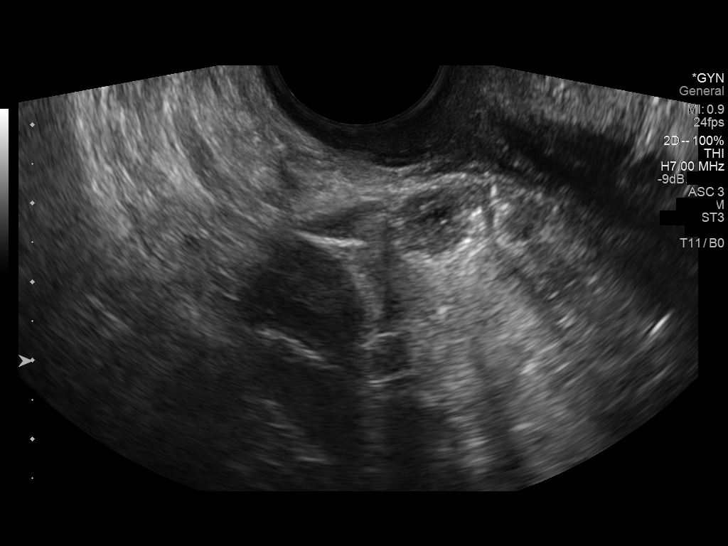
[im 57/86]
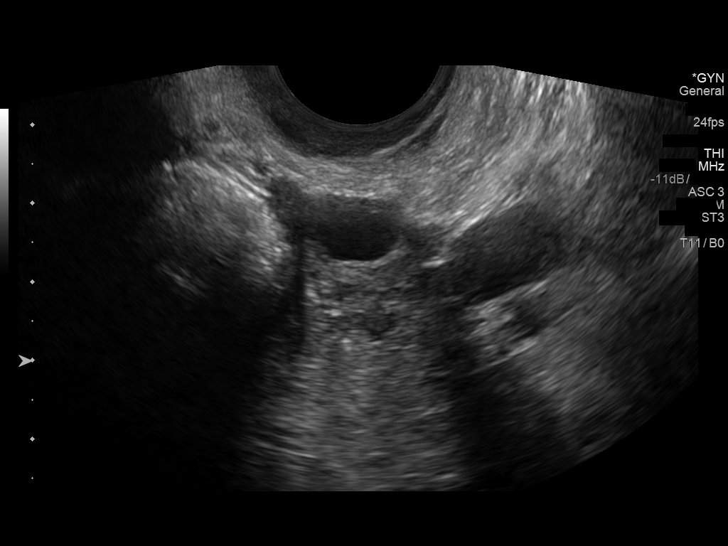
[im 63/86]
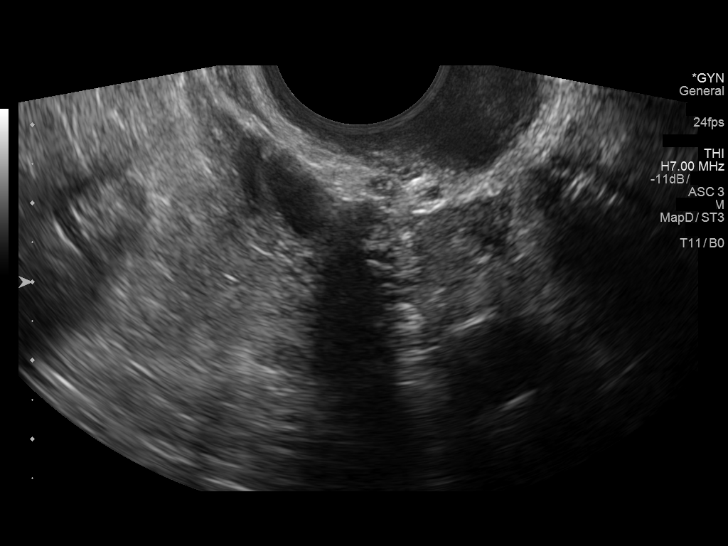
[im 70/86]
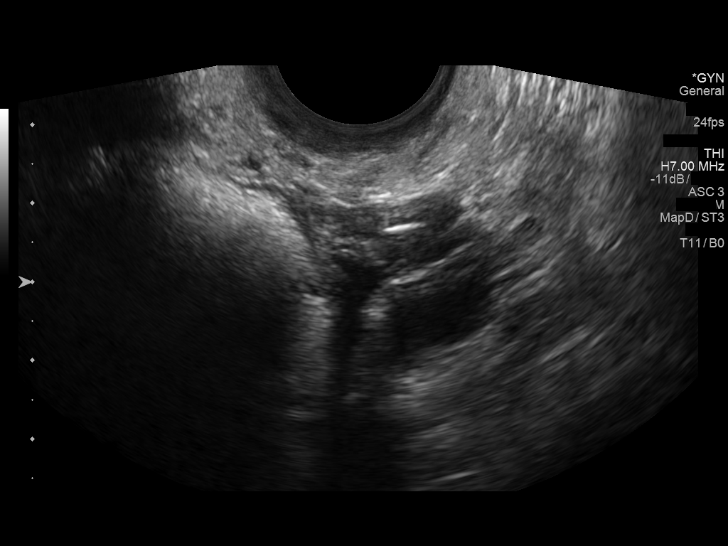
[im 76/86]
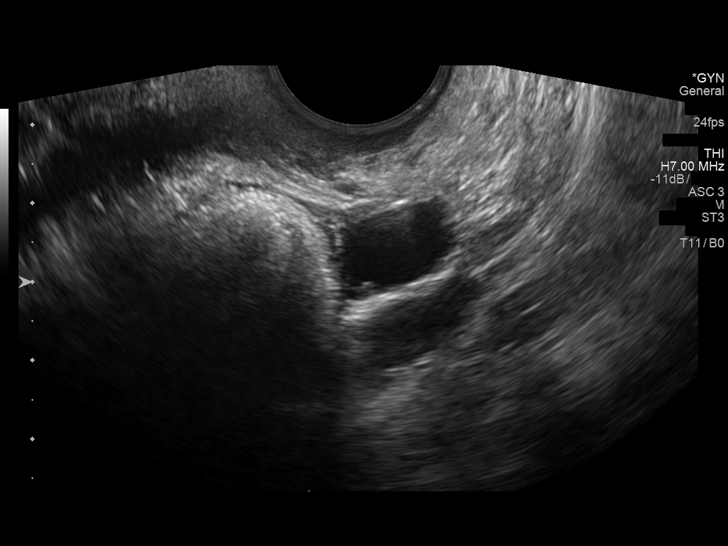
[im 82/86]
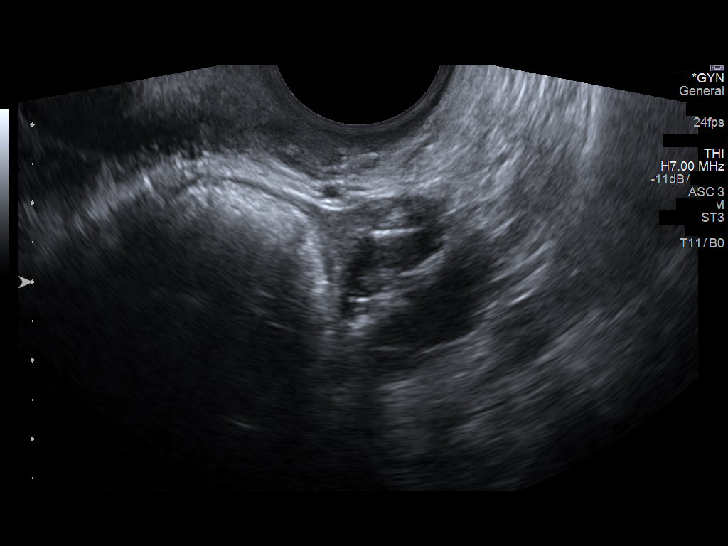

[13 of 28 positions shown; findings below may reference images not displayed]

FINDINGS: The uterus is anteverted appears grossly unremarkable. No
intrauterine pregnancy identified. The possibility of an ectopic
pregnancy is not excluded. Correlation with HCG levels and follow-up
with ultrasound if clinically indicated recommended.

The endometrium measures 8 mm in thickness. A 4 mm endometrial
echogenic focus may represent an area of scarring.

The ovaries appear unremarkable. Multiple small bilateral ovarian
follicles/cysts noted. A 2.1 x 1.3 x 1.5 cm anechoic area adjacent
to the left ovary likely represents a paraovarian cyst.
IMPRESSION: No intrauterine pregnancy identified. Correlation with clinical exam
and HCG levels and follow-up with ultrasound if clinically indicated
recommended.

Unremarkable uterus and ovaries.  Probable left paraovarian cyst.

## 2015-10-06 MED FILL — RIZATRIPTAN 10 MG TABLET: 10 | 14 days supply | Qty: 6 | Fill #0

## 2015-10-10 MED FILL — DOXYCYCLINE 100 MG TABLET: 100 | 30 days supply | Qty: 60 | Fill #0

## 2015-10-18 MED FILL — HYDROmorphone HCL 2 MG TABS: 2 | 5 days supply | Qty: 30 | Fill #0

## 2015-10-18 MED FILL — TROKENDI XR 100 MG CAPSULE: 100 | 30 days supply | Qty: 30 | Fill #0

## 2015-10-21 MED FILL — ONDANSETRON HCL 8 MG TABLET: 8 | 30 days supply | Qty: 180 | Fill #0

## 2015-10-31 MED FILL — NORETHIN-ESTRAD-FERR 1-0.02: 1-20 | 84 days supply | Qty: 84 | Fill #4

## 2015-10-31 MED FILL — BUTALB-ACETAMIN-CAFF 50-325: 50-325-40 | 30 days supply | Qty: 50 | Fill #0

## 2015-10-31 MED FILL — NORTRIPTYLINE HCL 25 MG CAP: 25 | 41 days supply | Qty: 120 | Fill #0

## 2015-11-02 ENCOUNTER — Ambulatory Visit (INDEPENDENT_AMBULATORY_CARE_PROVIDER_SITE_OTHER): Payer: BLUE CROSS/BLUE SHIELD | Admitting: *Deleted

## 2015-11-02 DIAGNOSIS — I442 Atrioventricular block, complete: Secondary | ICD-10-CM

## 2015-11-03 MED FILL — ALPRAZolam 1 MG TABS: 1 | 30 days supply | Qty: 60 | Fill #3

## 2015-11-03 MED FILL — ZOLPIDEM TARTRATE 10 MG TAB: 10 | 30 days supply | Qty: 30 | Fill #2

## 2015-11-03 NOTE — Progress Notes (Signed)
Remote pacemaker transmission.   

## 2015-11-09 ENCOUNTER — Encounter: Payer: Self-pay | Admitting: Cardiology

## 2015-11-10 MED FILL — DOXYCYCLINE HYCLATE 100 MG: 100 | 30 days supply | Qty: 60 | Fill #1

## 2015-11-15 LAB — CUP PACEART REMOTE DEVICE CHECK
Brady Statistic AP VS Percent: 0 %
Brady Statistic AS VP Percent: 100 %
Brady Statistic AS VS Percent: 0 %
Implantable Lead Implant Date: 20140219
Implantable Lead Implant Date: 20140219
Implantable Lead Location: 753859
Implantable Lead Location: 753860
Lead Channel Impedance Value: 508 Ohm
Lead Channel Impedance Value: 520 Ohm
Lead Channel Pacing Threshold Amplitude: 0.875 V
Lead Channel Pacing Threshold Pulse Width: 0.4 ms
Lead Channel Sensing Intrinsic Amplitude: 2.8 mV
Lead Channel Setting Pacing Amplitude: 2 V
Lead Channel Setting Pacing Amplitude: 2.5 V
Lead Channel Setting Pacing Pulse Width: 0.46 ms
MDC IDC MSMT BATTERY IMPEDANCE: 252 Ohm
MDC IDC MSMT BATTERY REMAINING LONGEVITY: 100 mo
MDC IDC MSMT BATTERY VOLTAGE: 2.79 V
MDC IDC MSMT LEADCHNL RA PACING THRESHOLD AMPLITUDE: 0.75 V
MDC IDC MSMT LEADCHNL RA PACING THRESHOLD PULSEWIDTH: 0.4 ms
MDC IDC SESS DTM: 20170823211119
MDC IDC SET LEADCHNL RV SENSING SENSITIVITY: 2.8 mV
MDC IDC STAT BRADY AP VP PERCENT: 0 %

## 2015-11-29 MED FILL — PROMETHAZINE 12.5 MG TABLET: 12.5 | 1 days supply | Qty: 2 | Fill #0

## 2015-11-29 MED FILL — SUPREP BOWEL PREP KIT: 17.5-3.13-1 | 1 days supply | Qty: 354 | Fill #0

## 2015-11-29 MED FILL — METOCLOPRAMIDE 5 MG TABLET: 5 | 1 days supply | Qty: 2 | Fill #0

## 2015-12-01 MED FILL — ALPRAZolam 1 MG TABS: 1 | 30 days supply | Qty: 60 | Fill #0

## 2015-12-06 MED FILL — ZOLPIDEM TARTRATE 10 MG TAB: 10 | 30 days supply | Qty: 30 | Fill #0

## 2015-12-13 MED FILL — DOXYCYCLINE HYCLATE 100 MG: 100 | 30 days supply | Qty: 60 | Fill #2

## 2016-01-05 MED FILL — ZOLPIDEM TARTRATE 10 MG TAB: 10 | 30 days supply | Qty: 30 | Fill #1

## 2016-01-05 MED FILL — ALPRAZolam 1 MG TABS: 1 | 30 days supply | Qty: 60 | Fill #1

## 2016-01-06 MED FILL — NORETHIN-ESTRAD-FERR 1-0.02: 1-20 | 84 days supply | Qty: 84 | Fill #0

## 2016-01-20 MED FILL — DOXYCYCLINE HYCLATE 100 MG: 100 | 15 days supply | Qty: 30 | Fill #3

## 2016-01-30 MED FILL — ZOLPIDEM TARTRATE 10 MG TAB: 10 | 30 days supply | Qty: 30 | Fill #2

## 2016-02-01 ENCOUNTER — Telehealth: Payer: Self-pay | Admitting: Cardiology

## 2016-02-01 ENCOUNTER — Ambulatory Visit (INDEPENDENT_AMBULATORY_CARE_PROVIDER_SITE_OTHER): Payer: BLUE CROSS/BLUE SHIELD | Admitting: *Deleted

## 2016-02-01 DIAGNOSIS — I442 Atrioventricular block, complete: Secondary | ICD-10-CM

## 2016-02-01 NOTE — Progress Notes (Signed)
Remote pacemaker transmission.   

## 2016-02-01 NOTE — Telephone Encounter (Signed)
Spoke with pt and reminded pt of remote transmission that is due today. Pt verbalized understanding.   

## 2016-02-08 MED FILL — DOXYCYCLINE HYC DR 100 MG T: 100 | 30 days supply | Qty: 60 | Fill #0

## 2016-02-08 MED FILL — ALPRAZolam 1 MG TABS: 1 | 30 days supply | Qty: 60 | Fill #0

## 2016-02-09 ENCOUNTER — Encounter: Payer: Self-pay | Admitting: Cardiology

## 2016-02-18 LAB — CUP PACEART REMOTE DEVICE CHECK
Battery Impedance: 277 Ohm
Battery Remaining Longevity: 97 mo
Battery Voltage: 2.79 V
Brady Statistic AP VS Percent: 0 %
Implantable Lead Implant Date: 20140219
Implantable Lead Location: 753860
Implantable Lead Model: 5076
Implantable Pulse Generator Implant Date: 20140219
Lead Channel Impedance Value: 537 Ohm
Lead Channel Pacing Threshold Amplitude: 0.5 V
Lead Channel Pacing Threshold Pulse Width: 0.4 ms
Lead Channel Pacing Threshold Pulse Width: 0.4 ms
Lead Channel Setting Pacing Amplitude: 2.5 V
Lead Channel Setting Sensing Sensitivity: 2.8 mV
MDC IDC LEAD IMPLANT DT: 20140219
MDC IDC LEAD LOCATION: 753859
MDC IDC MSMT LEADCHNL RA SENSING INTR AMPL: 2.8 mV
MDC IDC MSMT LEADCHNL RV IMPEDANCE VALUE: 507 Ohm
MDC IDC MSMT LEADCHNL RV PACING THRESHOLD AMPLITUDE: 0.75 V
MDC IDC SESS DTM: 20171122174705
MDC IDC SET LEADCHNL RA PACING AMPLITUDE: 2 V
MDC IDC SET LEADCHNL RV PACING PULSEWIDTH: 0.46 ms
MDC IDC STAT BRADY AP VP PERCENT: 0 %
MDC IDC STAT BRADY AS VP PERCENT: 100 %
MDC IDC STAT BRADY AS VS PERCENT: 0 %

## 2016-03-01 MED FILL — MESALAMINE DR 1.2G TABLET: 1.2 | 30 days supply | Qty: 120 | Fill #0

## 2016-03-01 MED FILL — ANUCORT-HC 25 MG SUPPOSITOR: 25 | 28 days supply | Qty: 8 | Fill #0

## 2016-03-01 MED FILL — SM ANTI-DIARRHEAL 2 MG CAPL: 2 | 48 days supply | Qty: 48 | Fill #0

## 2016-03-06 MED FILL — DIPHENOXYLATE/ATROPINE TAB: 2.5-0.025 | 2 days supply | Qty: 16 | Fill #0

## 2016-03-06 MED FILL — BUTALB-ACETAMIN-CAFF 50-325: 50-325-40 | 30 days supply | Qty: 50 | Fill #0

## 2016-03-06 MED FILL — ALPRAZolam 1 MG TABS: 1 | 30 days supply | Qty: 60 | Fill #1

## 2016-03-06 MED FILL — ZOLPIDEM TARTRATE 10 MG TAB: 10 | 30 days supply | Qty: 30 | Fill #0

## 2016-03-06 MED FILL — NORTRIPTYLINE HCL 50 MG CAP: 50 | 30 days supply | Qty: 90 | Fill #0

## 2016-03-08 MED FILL — DOXYCYCLINE HYC DR 100 MG T: 100 | 30 days supply | Qty: 60 | Fill #1

## 2016-03-08 MED FILL — AZITHROMYCIN 250 MG TABLET: 250 | 5 days supply | Qty: 6 | Fill #0

## 2016-03-09 MED FILL — AMOXICILLIN 500 MG CAPSULE: 500 | 10 days supply | Qty: 30 | Fill #0

## 2016-03-14 NOTE — Progress Notes (Deleted)
Electrophysiology Office Note Date: 03/14/2016  ID:  [name] [name], DOB 07-19-1975, MRN [id number]  PCP: Gaspar Garbe, MD Primary Cardiologist: *** Electrophysiologist: ***  CC: Pacemaker follow-up  [name] [name] is a 41 y.o. female seen today for ***.  {He/she (caps):30048} presents today for routine electrophysiology followup.  Since last being seen in our clinic, the patient reports doing very well.  {He/she (caps):30048} denies chest pain, palpitations, dyspnea, PND, orthopnea, nausea, vomiting, dizziness, syncope, edema, weight gain, or early satiety.  Device History: PPM implanted *** for ***   Past Medical History:  Diagnosis Date  . Abnormal Pap smear 2004   colpo  . Alcohol abuse   . Anxiety   . 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 Candida vaginitis 05/2006  . 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 Depression   . H/O candidiasis   . H/O varicella 2005  . IBS (irritable bowel syndrome)   . Panic attack   . Pelvic pain in female 2007   Past Surgical History:  Procedure Laterality Date  . CHOLECYSTECTOMY    . EP study and ablation  04/29/12   slow pathway ablation by Dr Johney Frame   . PACEMAKER INSERTION  04/30/12   Medtronic Adapta L implanted by Dr Ladona Ridgel for complete heart block  . PERMANENT PACEMAKER INSERTION N/A 04/30/2012   Procedure: PERMANENT PACEMAKER INSERTION;  Surgeon: Marinus Maw, MD;  Location: [facility] C[facility] LAB;  Service: Cardiovascular;  Laterality: N/A;  . SUPRAVENTRICULAR TACHYCARDIA ABLATION N/A 04/29/2012   Procedure: SUPRAVENTRICULAR TACHYCARDIA ABLATION;  Surgeon: Hillis Range, MD;  Location: [facility] CATH LAB;  Service: Cardiovascular;  Laterality: N/A;  . TONSILLECTOMY  1995    Current Outpatient Prescriptions  Medication Sig Dispense Refill  . ALPRAZolam (XANAX) 0.5 MG tablet Take 1 tablet (0.5 mg total) by mouth at bedtime. (Patient taking  differently: Take 0.5 mg by mouth daily as needed for anxiety. ) 30 tablet 2  . Aspirin-Salicylamide-Caffeine (BC HEADACHE POWDER PO) Take 1 packet by mouth daily as needed (for headaches).    Colleen Can FE 1/20 1-20 MG-MCG tablet Take 1 tablet by mouth daily.    . Multiple Vitamins-Minerals (MULTIVITAMIN) tablet Take 1 tablet by mouth daily. 30 tablet 0  . PRESCRIPTION MEDICATION Take 1 tablet by mouth at bedtime. Sleep medication    . rizatriptan (MAXALT) 10 MG tablet TAKE 1 TABLET (10 MG) BY MOUTH AS NEEDED FOR MIGRAINE. MAY REPEAT ONE TIME IN 2 HOURS IF NEEDED 30 tablet 1   No current facility-administered medications for this visit.     Allergies:   Patient has no known allergies.   Social History: Social History   Social History  . Marital status: Married    Spouse name: N/A  . Number of children: N/A  . Years of education: N/A   Occupational History  . Not on file.   Social History Main Topics  . Smoking status: Former Smoker    Packs/day: 0.50    Types: Cigarettes  . Smokeless tobacco: Never Used  . Alcohol use Yes  . Drug use: No  . Sexual activity: Yes    Partners: Male    Birth control/ protection: Pill     Comment: loestrin fe   Other Topics Concern  . Not on file   Social History Narrative   Lives in [place] with spouse and son age 27.   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 Diabetes Mother   . Cancer Sister     Lump removed frfom shoulder  . Heart disease Maternal Grandfather     MI  . Cancer Paternal Grandmother     breast     Review of Systems: General: No chills, fever, night sweats or weight changes  Cardiovascular:  No chest pain, dyspnea on exertion, edema, orthopnea, palpitations, paroxysmal nocturnal dyspnea  Dermatological: No rash, lesions or masses Respiratory: No cough, dyspnea Urologic: No hematuria, dysuria Abdominal: No nausea, vomiting, diarrhea, bright  red blood per rectum, melena, or hematemesis Neurologic: No visual changes, weakness, changes in mental status All other systems reviewed and are otherwise negative except as noted above.   Physical Exam: VS:  There were no vitals taken for this visit. , BMI There is no height or weight on file to calculate BMI.  GEN- The patient is well appearing, alert and oriented x 3 today.   HEENT: normocephalic, atraumatic; sclera clear, conjunctiva pink; hearing intact; oropharynx clear; neck supple, no JVP Lymph- no cervical lymphadenopathy Lungs- Clear to ausculation bilaterally, normal work of breathing.  No wheezes, rales, rhonchi Heart- Regular rate and rhythm, no murmurs, rubs or gallops, PMI not laterally displaced GI- soft, non-tender, non-distended, bowel sounds present, no hepatosplenomegaly Extremities- no clubbing, cyanosis, or edema; DP/PT/radial pulses 2+ bilaterally MS- no significant deformity or atrophy Skin- warm and dry, no rash or lesion; PPM pocket well healed Psych- euthymic mood, full affect Neuro- strength and sensation are intact  PPM Interrogation- reviewed in detail today,  See PACEART report  EKG:  EKG {ACTION; IS/IS [id number]}OT:[id number]} ordered today. The ekg ordered today shows ***  Recent Labs: 06/10/2015: BUN 14; Creatinine, Ser 0.73; Hemoglobin 13.5; Platelets 305; Potassium 4.6; Sodium 140   Wt Readings from Last 3 Encounters:  03/23/15 188 lb 6.4 oz (85.5 kg)  02/18/15 187 lb (84.8 kg)  12/27/14 198 lb 3.2 oz (89.9 kg)     Other studies Reviewed: Additional studies/ records that were reviewed today include: ***  Review of the above records today demonstrates: ***  Assessment and Plan:  1.  Symptomatic bradycardia Normal PPM function See Pace Art report No changes today   Current medicines are reviewed at length with the patient today.   The patient {ACTIONS; HAS/DOES NOT HAVE:19233} concerns regarding her medicines.  The following changes were made  today:  {NONE DEFAULTED:18576::"none"}  Labs/ tests ordered today include: *** No orders of the defined types were placed in this encounter.    Disposition:   Follow up with *** {gen number [id number]}[id number]:[id number]} {TIME; UNITS DAY/WEEK/MONTH:19136}   Signed, Gypsy BalsamAmber Tamera Pingley, NP 03/14/2016 6:06 PM  [facility] [address] [place] [address] [phone] (office) [phone] (fax

## 2016-03-16 ENCOUNTER — Encounter: Payer: Self-pay | Admitting: Nurse Practitioner

## 2016-03-16 ENCOUNTER — Ambulatory Visit (INDEPENDENT_AMBULATORY_CARE_PROVIDER_SITE_OTHER): Payer: BLUE CROSS/BLUE SHIELD | Admitting: Nurse Practitioner

## 2016-03-16 VITALS — HR 92 | Ht 65.0 in | Wt 193.0 lb

## 2016-03-16 DIAGNOSIS — F418 Other specified anxiety disorders: Secondary | ICD-10-CM | POA: Diagnosis not present

## 2016-03-16 DIAGNOSIS — F329 Major depressive disorder, single episode, unspecified: Secondary | ICD-10-CM

## 2016-03-16 DIAGNOSIS — F32A Depression, unspecified: Secondary | ICD-10-CM

## 2016-03-16 DIAGNOSIS — F419 Anxiety disorder, unspecified: Secondary | ICD-10-CM

## 2016-03-16 DIAGNOSIS — I471 Supraventricular tachycardia: Secondary | ICD-10-CM

## 2016-03-16 DIAGNOSIS — I442 Atrioventricular block, complete: Secondary | ICD-10-CM | POA: Diagnosis not present

## 2016-03-16 NOTE — Progress Notes (Signed)
Electrophysiology Office Note Date: 03/16/2016  ID:  [name] [name], DOB [birth year], MRN [id number]  PCP: Gaspar Garbeichard W Tisovec, MD Electrophysiologist: Johney FrameAllred  CC: Pacemaker follow-up  [name] [name] is a 41 y.o. female seen today for Dr Johney FrameAllred.  She presents today for routine electrophysiology followup.  Since last being seen in our clinic, the patient has struggled with ulcerative colitis and migraines. She has stopped smoking and drinking. Her family situation has stabilized.  She denies chest pain, palpitations, dyspnea, PND, orthopnea, nausea, vomiting, dizziness, syncope, edema, weight gain, or early satiety.  Device History: MDT dual chamber PPM implanted 2014 for complete heart block    Past Medical History:  Diagnosis Date  . Abnormal Pap smear 2004   colpo  . Alcohol abuse   . Anxiety   . 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 Candida vaginitis 05/2006  . 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 Depression   . H/O candidiasis   . H/O varicella 2005  . IBS (irritable bowel syndrome)   . Panic attack   . Pelvic pain in female 2007   Past Surgical History:  Procedure Laterality Date  . CHOLECYSTECTOMY    . EP study and ablation  04/29/12   slow pathway ablation by Dr Johney FrameAllred   . PACEMAKER INSERTION  04/30/12   Medtronic Adapta L implanted by Dr Ladona Ridgelaylor for complete heart block  . PERMANENT PACEMAKER INSERTION N/A 04/30/2012   Procedure: PERMANENT PACEMAKER INSERTION;  Surgeon: Marinus MawGregg W Taylor, MD;  [facility];  Service: Cardiovascular;  Laterality: N/A;  . SUPRAVENTRICULAR TACHYCARDIA ABLATION N/A 04/29/2012   Procedure: SUPRAVENTRICULAR TACHYCARDIA ABLATION;  Surgeon: Hillis RangeJames Allred, MD;  [facility];  Service: Cardiovascular;  Laterality: N/A;  . TONSILLECTOMY  1995    Current Outpatient Prescriptions  Medication Sig Dispense Refill  . ALPRAZolam (XANAX) 1 MG  tablet Take 1 mg by mouth 2 (two) times daily as needed.  3  . Aspirin-Salicylamide-Caffeine (BC HEADACHE POWDER PO) Take 1 packet by mouth daily as needed (for headaches).    Colleen Can. JUNEL FE 1/20 1-20 MG-MCG tablet Take 1 tablet by mouth daily.    . Multiple Vitamins-Minerals (MULTIVITAMIN) tablet Take 1 tablet by mouth daily. 30 tablet 0  . nortriptyline (PAMELOR) 50 MG capsule Take 50 mg by mouth at bedtime.  5  . PRESCRIPTION MEDICATION Take 1 tablet by mouth at bedtime. Sleep medication    . zolpidem (AMBIEN) 10 MG tablet Take 10 mg by mouth at bedtime.  2   No current facility-administered medications for this visit.     Allergies:   Patient has no known allergies.   Social History: Social History   Social History  . Marital status: Married    Spouse name: N/A  . Number of children: N/A  . Years of education: N/A   Occupational History  . Not on file.   Social History Main Topics  . Smoking status: Former Smoker    Packs/day: 0.50    Types: Cigarettes  . Smokeless tobacco: Never Used  . Alcohol use Yes  . Drug use: No  . Sexual activity: Yes    Partners: Male    Birth control/ protection: Pill     Comment: loestrin fe   Other Topics Concern  . Not on file   Social History Narrative   [place] and son age 18.  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 Diabetes Mother   . Cancer Sister     Lump removed frfom shoulder  . Heart disease Maternal Grandfather     MI  . Cancer Paternal Grandmother     breast     Review of Systems: All other systems reviewed and are otherwise negative except as noted above.   Physical Exam: VS:  Pulse 92   Ht 5\' 5"  (1.651 m)   Wt 193 lb (87.5 kg)   BMI 32.12 kg/m  , BMI Body mass index is 32.12 kg/m.  GEN- The patient is obese appearing, alert and oriented x 3 today.   HEENT: normocephalic, atraumatic; sclera clear, conjunctiva  pink; hearing intact; oropharynx clear; neck supple  Lungs- Clear to ausculation bilaterally, normal work of breathing.  No wheezes, rales, rhonchi Heart- Regular rate and rhythm (paced) GI- soft, non-tender, non-distended, bowel sounds present Extremities- no clubbing, cyanosis, or edema; DP/PT/radial pulses 2+ bilaterally MS- no significant deformity or atrophy Skin- warm and dry, no rash or lesion; PPM pocket well healed Psych- euthymic mood, full affect Neuro- strength and sensation are intact  PPM Interrogation- reviewed in detail today,  See PACEART report  EKG:  EKG is not ordered today.  Recent Labs: 06/10/2015: BUN 14; Creatinine, Ser 0.73; Hemoglobin 13.5; Platelets 305; Potassium 4.6; Sodium 140   Wt Readings from Last 3 Encounters:  03/16/16 193 lb (87.5 kg)  03/23/15 188 lb 6.4 oz (85.5 kg)  02/18/15 187 lb (84.8 kg)     Other studies Reviewed: Additional studies/ records that were reviewed today include: Dr Jenel Lucks office notes  Assessment and Plan:  1.  Complete heart block Normal PPM function See Pace Art report No changes today  2.  SVT No recurrence  3.  Situational anxiety/depression Denies SI/HI today Followed by PCP   Current medicines are reviewed at length with the patient today.   The patient does not have concerns regarding her medicines.  The following changes were made today:  none  Labs/ tests ordered today include: none No orders of the defined types were placed in this encounter.    Disposition:   Follow up with [facility], Dr Johney Frame 1 year     Signed, Gypsy Balsam, NP 03/16/2016 10:32 AM  [facility] [address] [place] [address] [phone] (office) [phone] (fax

## 2016-03-16 NOTE — Patient Instructions (Signed)
Medication Instructions:   Your physician recommends that you continue on your current medications as directed. Please refer to the Current Medication list given to you today.   If you need a refill on your cardiac medications before your next appointment, please call your pharmacy.  Labwork: NONE ORDERED  TODAY    Testing/Procedures: NONE ORDERED  TODAY    Follow-Up:  Your physician wants you to follow-up in: ONE YEAR WITH ALLRED  You will receive a reminder letter in the mail two months in advance. If you don't receive a letter, please call our office to schedule the follow-up appointment.  Remote monitoring is used to monitor your Pacemaker of ICD from home. This monitoring reduces the number of office visits required to check your device to one time per year. It allows us to keep an eye on the functioning of your device to ensure it is working properly. You are scheduled for a device check from home on . 06/15/2016..You may send your transmission at any time that day. If you have a wireless device, the transmission will be sent automatically. After your physician reviews your transmission, you will receive a postcard with your next transmission date.     Any Other Special Instructions Will Be Listed Below (If Applicable).

## 2016-04-03 ENCOUNTER — Encounter: Payer: Self-pay | Admitting: Neurology

## 2016-04-03 ENCOUNTER — Ambulatory Visit (INDEPENDENT_AMBULATORY_CARE_PROVIDER_SITE_OTHER): Payer: BLUE CROSS/BLUE SHIELD | Admitting: Neurology

## 2016-04-03 ENCOUNTER — Other Ambulatory Visit (INDEPENDENT_AMBULATORY_CARE_PROVIDER_SITE_OTHER): Payer: BLUE CROSS/BLUE SHIELD

## 2016-04-03 VITALS — BP 124/76 | HR 81 | Ht 65.0 in | Wt 191.4 lb

## 2016-04-03 DIAGNOSIS — G43719 Chronic migraine without aura, intractable, without status migrainosus: Secondary | ICD-10-CM

## 2016-04-03 DIAGNOSIS — Z79899 Other long term (current) drug therapy: Secondary | ICD-10-CM

## 2016-04-03 LAB — BASIC METABOLIC PANEL
BUN: 10 mg/dL (ref 6–23)
CALCIUM: 9.9 mg/dL (ref 8.4–10.5)
CO2: 28 meq/L (ref 19–32)
Chloride: 101 mEq/L (ref 96–112)
Creatinine, Ser: 0.94 mg/dL (ref 0.40–1.20)
GFR: 70.06 mL/min (ref >60.00)
GLUCOSE: 98 mg/dL (ref 70–99)
POTASSIUM: 4.5 meq/L (ref 3.5–5.1)
SODIUM: 135 meq/L (ref 135–145)

## 2016-04-03 MED ORDER — TRAMADOL-ACETAMINOPHEN 37.5-325 MG PO TABS: 1.0000 | ORAL_TABLET | Freq: Four times a day (QID) | ORAL | 0 refills | Status: DC | PRN

## 2016-04-03 MED ORDER — PREDNISONE 10 MG PO TABS: ORAL_TABLET | ORAL | 0 refills | Status: DC

## 2016-04-03 MED ORDER — TOPIRAMATE 50 MG PO TABS: 50.0000 mg | ORAL_TABLET | Freq: Every day | ORAL | 0 refills | Status: DC

## 2016-04-03 MED FILL — predniSONE 10 MG TABS: 10 | 6 days supply | Qty: 21 | Fill #0

## 2016-04-03 MED FILL — TOPIRAMATE 50 MG TABLET: 50 | 30 days supply | Qty: 30 | Fill #0

## 2016-04-03 MED FILL — TRAMADOL-ACETAMINOPHN 37.5-: 37.5-325 | 4 days supply | Qty: 25 | Fill #0

## 2016-04-03 NOTE — Patient Instructions (Addendum)
Migraine Recommendations: 1.  Start topiramate 50mg  at bedtime.  Possible side effects include: impaired thinking, sedation, paresthesias (numbness and tingling) and weight loss.  It may cause dehydration and there is a small risk for kidney stones, so make sure to stay hydrated with water during the day.  There is also a very small risk for glaucoma, so if you notice any change in your vision while taking this medication, see an ophthalmologist.   2.  Take Ultracet (tramadol-acetaminophen) 1 to 2 tablets every 6 hours as needed, limited to no more than 2 days out of the week. 3.  Limit use of pain relievers to no more than 2 days out of the week.  These medications include acetaminophen, ibuprofen, triptans and narcotics.  This will help reduce risk of rebound headaches. 4.  Be aware of common food triggers such as processed sweets, processed foods with nitrites (such as deli meat, hot dogs, sausages), foods with MSG, alcohol (such as wine), chocolate, certain cheeses, certain fruits (dried fruits, some citrus fruit), vinegar, diet soda. 4.  Avoid caffeine 5.  Routine exercise 6.  Proper sleep hygiene 7.  Stay adequately hydrated with water 8.  Keep a headache diary. 9.  Maintain proper stress management. 10.  Do not skip meals. 11.  Consider supplements:  Magnesium citrate 400mg  to 600mg  daily, riboflavin 400mg , Coenzyme Q 10 100mg  three times daily 12.  Stop the butalbital.  You must taper off of BCs completely  Take for only 6 days this week  Then for only 5 days next week  Then for only 4 days the following week  Then for only 3 days for a week  Then for only 2 days for a week  Then for only 1 day for a week  Then STOP 13.  To help ease tapering off of BC, will prescribe you a prednisone taper (10mg  tablets):  Take 6tabs x1day, then 5tabs x1day, then 4tabs x1day, then 3tabs x1day, then 2tabs x1day, then 1tab x1day, then STOP.  Do not take any NSAIDs while on it, as it can aggravate your  stomach. 14.  Contact me in 4 weeks and follow up in 3 months.

## 2016-04-03 NOTE — Progress Notes (Signed)
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  History, including headache semiology, supplemented by PCP notes.  Onset:  Many years ago.  They resolved but returned about March 2017.  She has had increased depression and anxiety since experiencing cardiac arrest 4 years ago and was diagnosed with heart block.  She also was diagnosed with ulcerative colitis and has been unable to work since August. Location:  Bi-frontal, back of neck Quality:  pounding Intensity:  Constant 6/10 with episodes of 10/10 Aura:  no Prodrome:  no Associated symptoms:  nausea, vomiting, photophobia, phonophobia, vertigo, weakness, paresthesias Duration:  Constant (severe 10/10 fluctuations last 24 to 48 hours) Frequency:  Daily (severe 10/10 fluctuations occur every 10 days) Triggers/exacerbating factors:  noise, light, movement Relieving factors:  Rest in quiet dark room with cool rag Activity:  Aggravated by light activity.  Cannot function at all when severe.  Past abortive therapy:  Midrin, ibuprofen, naproxen, Tylenol, caffeine, Maxalt (slurred speech/difficulty thinking), Percocet Past preventative therapy:  Nortriptyline  Current NSAIDS:  no Current analgesics:  Fioricet (takes 1 to 2 days per week), BC powder (takes daily) Current triptans:  no Current anti-emetic:  no Current muscle relaxants:  no Current anti-anxiolytic:  Xanax Current sleep aide:  Ambien Current Antihypertensive  medications:  no Current Antidepressant medications:  no Current Anticonvulsant medications:  no Current Vitamins/Herbal/Supplements:  no Current Antihistamines/Decongestants:  no Other therapy:  no Other medication:  Junel Fe  Caffeine:  Restarted it daily for migraines, Diet Dr. Reino Kent Alcohol:  stopped Smoker:  stopped Diet:  Hydrates with water.  Does drink soda (Diet Dr. Reino Kent and Diet Ginger Ale) Exercise:  Not routine. Depression/stress/anxiety:  She has a significant history of depression with past suicide attempt.  She is doing well now.   Sleep hygiene:  Poor.  Mind races Family history of headache:  father  CT of head from 03/23/14 was normal.  PAST MEDICAL HISTORY: Past Medical History:  Diagnosis Date  . Abnormal Pap smear 2004   colpo  . Alcohol abuse   . Anxiety   . 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 Candida vaginitis 05/2006  . 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 Depression   . H/O candidiasis   . H/O varicella 2005  . IBS (irritable bowel syndrome)   . Panic attack   . Pelvic pain in female 2007    PAST SURGICAL HISTORY: Past Surgical History:  Procedure Laterality Date  . CHOLECYSTECTOMY    . EP study and ablation  04/29/12   slow pathway ablation by Dr Johney Frame   . PACEMAKER INSERTION  04/30/12   Medtronic Adapta L implanted by Dr Ladona Ridgel for complete heart block  . PERMANENT PACEMAKER INSERTION N/A 04/30/2012   Procedure: PERMANENT PACEMAKER INSERTION;  Surgeon: Marinus Maw, MD;  Location: [facility] CATH LAB;  Service: Cardiovascular;  Laterality: N/A;  . SUPRAVENTRICULAR TACHYCARDIA ABLATION N/A 04/29/2012   Procedure: SUPRAVENTRICULAR TACHYCARDIA ABLATION;  Surgeon: Hillis Range, MD;  Location: [facility]  CATH LAB;  Service: Cardiovascular;  Laterality: N/A;  . TONSILLECTOMY  1995    MEDICATIONS: Current Outpatient Prescriptions on File Prior to Visit  Medication Sig  Dispense Refill  . ALPRAZolam (XANAX) 1 MG tablet Take 1 mg by mouth 2 (two) times daily as needed.  3  . JUNEL FE 1/20 1-20 MG-MCG tablet Take 1 tablet by mouth daily.    Marland Kitchen. zolpidem (AMBIEN) 10 MG tablet Take 10 mg by mouth at bedtime.  2   No current facility-administered medications on file prior to visit.     ALLERGIES: No Known Allergies  FAMILY HISTORY: Family History  Problem Relation Age of Onset  . Diabetes Mother   . Cancer Sister     Lump removed frfom shoulder  . Heart disease Maternal Grandfather     MI  . Cancer Paternal Grandmother     breast    SOCIAL HISTORY: Social History   Social History  . Marital status: Married    Spouse name: N/A  . Number of children: N/A  . Years of education: N/A   Occupational History  . Not on file.   Social History Main Topics  . Smoking status: Former Smoker    Packs/day: 0.50    Types: Cigarettes  . Smokeless tobacco: Never Used  . Alcohol use Yes  . Drug use: No  . Sexual activity: Yes    Partners: Male    Birth control/ protection: Pill     Comment: loestrin fe   Other Topics Concern  . Not on file   Social History Narrative   [place] and son age 688.   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    REVIEW OF SYSTEMS: Constitutional: No fevers, chills, or sweats, no generalized fatigue, change in appetite Eyes: No visual changes, double vision, eye pain Ear, nose and throat: No hearing loss, ear pain, nasal congestion, sore throat Cardiovascular: No chest pain, palpitations Respiratory:  No shortness of breath at rest or with exertion, wheezes GastrointestinaI: ulcerative colitis Genitourinary:  No dysuria, urinary retention or frequency Musculoskeletal:  No neck pain, back pain Integumentary: No rash, pruritus, skin lesions Neurological: as above Psychiatric: depression, insomnia, anxiety Endocrine: No palpitations, fatigue, diaphoresis, mood swings, change in appetite, change in  weight, increased thirst Hematologic/Lymphatic:  No purpura, petechiae. Allergic/Immunologic: no itchy/runny eyes, nasal congestion, recent allergic reactions, rashes  PHYSICAL EXAM: Vitals:   04/03/16 0932  BP: 124/76  Pulse: 81   General: No acute distress.  Patient appears well-groomed.  Head:  Normocephalic/atraumatic Eyes:  fundi examined but not visualized Neck: supple, no paraspinal tenderness, full range of motion Back: No paraspinal tenderness Heart: regular rate and rhythm Lungs: Clear to auscultation bilaterally. Vascular: No carotid bruits. Neurological Exam: Mental status: alert and oriented to person, place, and time, recent and remote memory intact, fund of knowledge intact, attention and concentration intact, speech fluent and not dysarthric, language intact. Cranial nerves: CN I: not tested CN II: pupils equal, round and reactive to light, visual fields intact CN III, IV, VI:  full range of motion, no nystagmus, no ptosis CN V: facial sensation intact CN VII: upper and lower face symmetric CN VIII: hearing intact CN IX, X: gag intact, uvula midline CN XI: sternocleidomastoid and trapezius muscles intact CN XII: tongue midline Bulk & Tone: normal, no fasciculations. Motor:  5/5 throughout  Sensation: temperature and vibration sensation intact. Deep Tendon Reflexes:  2+ throughout, toes downgoing.  Finger to nose testing:  Without  dysmetria.  Heel to shin:  Without dysmetria.  Gait:  Normal station and stride.  Able to turn and tandem walk. Romberg negative.  IMPRESSION: Chronic migraine without aura.  She would rather not start another antidepressant.  Abortive therapy limited as I do not feel comfortable prescribing triptans given her history of heart block (unless cleared by cardiology), and would rather limit NSAID use due to UC.  PLAN: 1.  Start topiramate 50mg  at bedtime.  Side effects discussed, including teratogenicity (so should not get pregnant).   Will check baseline BMP.  2.  Take Ultracet (tramadol-acetaminophen) for abortive therapy. 3.  Limit use of pain relievers to no more than 2 days out of the week.  These medications include acetaminophen, ibuprofen, triptans and narcotics.  This will help reduce risk of rebound headaches. 4.  Be aware of common food triggers such as processed sweets, processed foods with nitrites (such as deli meat, hot dogs, sausages), foods with MSG, alcohol (such as wine), chocolate, certain cheeses, certain fruits (dried fruits, some citrus fruit), vinegar, diet soda. 4.  Avoid caffeine 5.  Routine exercise 6.  Proper sleep hygiene 7.  Stay adequately hydrated with water 8.  Keep a headache diary. 9.  Maintain proper stress management. 10.  Do not skip meals. 11.  Consider supplements:  Magnesium citrate 400mg  to 600mg  daily, riboflavin 400mg , Coenzyme Q 10 100mg  three times daily 12.  Stop Fioricet and will taper off of BC. 13.  To help ease tapering off of BC, will prescribe a prednisone taper (instructed not take any NSAIDs while on it) 14.  Contact me in 4 weeks and follow up in 3 months.  Thank you for allowing me to take part in the care of this patient.  [name], DO  CC:  Suszanne Conners. Elise Benne, PA-C

## 2016-04-04 ENCOUNTER — Telehealth: Payer: Self-pay

## 2016-04-04 MED FILL — ALPRAZolam 1 MG TABS: 1 | 30 days supply | Qty: 60 | Fill #2

## 2016-04-04 MED FILL — ZOLPIDEM TARTRATE 10 MG TAB: 10 | 30 days supply | Qty: 30 | Fill #1

## 2016-04-04 NOTE — Telephone Encounter (Signed)
This encounter was created in error - please disregard.

## 2016-04-04 NOTE — Telephone Encounter (Signed)
Called patient to give lab results. No answer. Left detailed vmail per DPR.

## 2016-04-04 NOTE — Telephone Encounter (Signed)
-----   Message from Drema DallasAdam R Jaffe, DO sent at 04/03/2016  9:15 PM EST ----- Lab is normal

## 2016-04-18 ENCOUNTER — Encounter: Payer: Self-pay | Admitting: Nurse Practitioner

## 2016-04-18 MED FILL — CLINDA-BENZOYL PEROX 1-5% P: 1-5 | 30 days supply | Qty: 50 | Fill #0

## 2016-04-30 ENCOUNTER — Telehealth: Payer: Self-pay | Admitting: Neurology

## 2016-04-30 NOTE — Telephone Encounter (Signed)
PT called and wanted to give a medication update/Dawn CB# [phone]

## 2016-05-01 MED ORDER — TRAMADOL-ACETAMINOPHEN 37.5-325 MG PO TABS: 1.0000 | ORAL_TABLET | Freq: Four times a day (QID) | ORAL | 0 refills | Status: DC | PRN

## 2016-05-01 MED FILL — TRAMADOL-ACETAMINOPHN 37.5-: 37.5-325 | 4 days supply | Qty: 25 | Fill #0

## 2016-05-01 NOTE — Telephone Encounter (Signed)
Patient is calling with her topamax update.  She is on 50 mg but said that you may want to increase it to 100 mg.  She is also needing a refill on the tramadol.

## 2016-05-01 NOTE — Telephone Encounter (Signed)
Spoke to patient. Gave instructions. Would like to have Ultracet refilled. Phoned-in Rx. Patient verbalized understanding.

## 2016-05-01 NOTE — Telephone Encounter (Signed)
1.  If the headaches are significantly improved, she may continue topamax 50mg  at bedtime.  If they are still frequent or only mildly improved, then we can increase dose to 100mg  at bedtime.  She should then contact us in 4 weeks with another update.    2.  I believe I prescribed her acetaminophen-tramadol combination pill (Ultracet).  We can refill that for her.

## 2016-05-03 ENCOUNTER — Other Ambulatory Visit: Payer: Self-pay

## 2016-05-03 MED ORDER — TOPIRAMATE 100 MG PO TABS: 100.0000 mg | ORAL_TABLET | Freq: Every day | ORAL | 2 refills | Status: DC

## 2016-05-03 MED ORDER — TOPIRAMATE 50 MG PO TABS
50.0000 mg | ORAL_TABLET | Freq: Every day | ORAL | 0 refills | Status: DC
Start: 2016-05-03 — End: 2016-05-03

## 2016-05-03 MED FILL — TOPIRAMATE 100 MG TABLET: 100 | 30 days supply | Qty: 30 | Fill #0

## 2016-05-08 MED FILL — ZOLPIDEM TARTRATE 10 MG TAB: 10 | 30 days supply | Qty: 30 | Fill #2

## 2016-05-08 MED FILL — ALPRAZolam 1 MG TABS: 1 | 30 days supply | Qty: 60 | Fill #3

## 2016-05-09 MED FILL — raNITIdine HCL 300 MG TABS: 300 | 30 days supply | Qty: 30 | Fill #0

## 2016-05-10 MED FILL — TAYTULLA 1 MG-20 MCG CAP: 1-20 | 84 days supply | Qty: 84 | Fill #0

## 2016-05-18 MED FILL — OMEPRAZOLE DR 40 MG CAPSULE: 40 | 30 days supply | Qty: 30 | Fill #0

## 2016-05-23 MED FILL — FLUCONAZOLE 150 MG TABLET: 150 | 3 days supply | Qty: 3 | Fill #0

## 2016-06-01 MED FILL — FLUCONAZOLE 150 MG TABLET: 150 | 3 days supply | Qty: 3 | Fill #1

## 2016-06-01 MED FILL — TOPIRAMATE 100 MG TABLET: 100 | 30 days supply | Qty: 30 | Fill #1

## 2016-06-07 MED FILL — ZOLPIDEM TARTRATE 10 MG TAB: 10 | 30 days supply | Qty: 30 | Fill #0

## 2016-06-07 MED FILL — ALPRAZolam 1 MG TABS: 1 | 30 days supply | Qty: 60 | Fill #0

## 2016-06-12 ENCOUNTER — Other Ambulatory Visit: Payer: Self-pay

## 2016-06-12 MED ORDER — TRAMADOL-ACETAMINOPHEN 37.5-325 MG PO TABS: 1.0000 | ORAL_TABLET | Freq: Four times a day (QID) | ORAL | 0 refills | Status: DC | PRN

## 2016-06-12 MED FILL — PROCTO-MED HC 2.5% CREAM: 2.5 | 10 days supply | Qty: 30 | Fill #0

## 2016-06-12 MED FILL — MESALAMINE DR 1.2G TABLET: 1.2 | 30 days supply | Qty: 60 | Fill #0

## 2016-06-12 MED FILL — TRAMADOL-ACETAMINOPHN 37.5-: 37.5-325 | 4 days supply | Qty: 25 | Fill #0

## 2016-06-12 MED FILL — DEXILANT DR 60 MG CAPSULE: 60 | 30 days supply | Qty: 30 | Fill #0

## 2016-06-14 ENCOUNTER — Telehealth: Payer: Self-pay | Admitting: Neurology

## 2016-06-14 NOTE — Telephone Encounter (Signed)
Spoke to patient. Explained Ultracet phoned-in to pharmacy on 04/03. Patient will contact pharmacy.

## 2016-06-14 NOTE — Telephone Encounter (Signed)
PT called and needs a refill Tramadol called in/Dawn

## 2016-07-02 ENCOUNTER — Ambulatory Visit: Payer: Self-pay | Admitting: Neurology

## 2016-07-02 MED FILL — TOPIRAMATE 100 MG TABLET: 100 | 30 days supply | Qty: 30 | Fill #2

## 2016-07-04 ENCOUNTER — Encounter: Payer: Self-pay | Admitting: Neurology

## 2016-07-05 MED FILL — ALPRAZolam 1 MG TABS: 1 | 30 days supply | Qty: 60 | Fill #1

## 2016-07-05 MED FILL — ZOLPIDEM TARTRATE 10 MG TAB: 10 | 30 days supply | Qty: 30 | Fill #1

## 2016-07-17 MED FILL — DEXILANT DR 60 MG CAPSULE: 60 | 30 days supply | Qty: 30 | Fill #1

## 2016-07-17 MED FILL — TAYTULLA 1 MG-20 MCG CAP: 1-20 | 84 days supply | Qty: 84 | Fill #1

## 2016-07-25 ENCOUNTER — Encounter: Payer: Self-pay | Admitting: Neurology

## 2016-07-25 ENCOUNTER — Ambulatory Visit (INDEPENDENT_AMBULATORY_CARE_PROVIDER_SITE_OTHER): Payer: BLUE CROSS/BLUE SHIELD | Admitting: Neurology

## 2016-07-25 VITALS — BP 110/70 | HR 70 | Ht 65.0 in | Wt 166.5 lb

## 2016-07-25 DIAGNOSIS — G43719 Chronic migraine without aura, intractable, without status migrainosus: Secondary | ICD-10-CM

## 2016-07-25 MED ORDER — TOPIRAMATE 50 MG PO TABS: 150.0000 mg | ORAL_TABLET | Freq: Every day | ORAL | 3 refills | Status: DC

## 2016-07-25 MED ORDER — TRAMADOL-ACETAMINOPHEN 37.5-325 MG PO TABS: 1.0000 | ORAL_TABLET | Freq: Four times a day (QID) | ORAL | 5 refills | Status: DC | PRN

## 2016-07-25 MED FILL — TOPIRAMATE 50 MG TABLET: 50 | 90 days supply | Qty: 90 | Fill #0

## 2016-07-25 NOTE — Progress Notes (Signed)
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  UPDATE: Headaches are improved. She still has daily headache, except it is a dull 2-3/10 nagging headache now, that doesn't require medication.  Severe headaches that require Ultracet occur once a week, lasting 2 days. Frequency of abortive medication: 1 to 2 days a week Current NSAIDS:  no Current analgesics:  Ultracet Current triptans:  no Current anti-emetic:  no Current muscle relaxants:  no Current anti-anxiolytic:  Xanax Current sleep aide:  Ambien Current Antihypertensive medications:  no Current Antidepressant medications:  no Current Anticonvulsant medications:  topiramate 100mg  Current Vitamins/Herbal/Supplements:  no Current Antihistamines/Decongestants:  no Other therapy:  no Other medication:  Junel Fe   Caffeine:  Restarted it daily for migraines, Diet Dr. Reino Kent Alcohol:  stopped Smoker:  stopped Diet:  Hydrates with water.  Now Gluten-free, which has helped her UC symptoms and overall well-being. Exercise:  Increased Depression/stress/anxiety:  She started a new job and has more energy.  Sleep hygiene:  Improved.  Since she has a new job, she is tired at night and sleeps 5 to 6 hours, which is good for her.  She uses Ambien but no longer uses Xanax at night.  HISTORY:  Onset:  Many years ago.  They resolved but returned about March 2017.  She has had increased depression and anxiety since experiencing cardiac arrest 4 years ago and was diagnosed with heart block.  She also was diagnosed with ulcerative colitis and has been unable to work since August. Location:   Bi-frontal, back of neck Quality:  pounding Intensity:  Constant 6/10 with episodes of 10/10 Aura:  no Prodrome:  no Associated symptoms:  nausea, vomiting, photophobia, phonophobia, vertigo, weakness, paresthesias Duration:  Constant (severe 10/10 fluctuations last 24 to 48 hours) Frequency:  Daily (severe 10/10 fluctuations occur every 10 days) Triggers/exacerbating factors:  noise, light, movement Relieving factors:  Rest in quiet dark room with cool rag Activity:  Aggravated by light activity.  Cannot function at all when severe.   Past abortive therapy:  Midrin, ibuprofen, naproxen, Tylenol, caffeine, Maxalt (slurred speech/difficulty thinking), Percocet, Fioricet, BC Past preventative therapy:  Nortriptyline   Family history of headache:  father   CT of head from 03/23/14 was normal.  PAST MEDICAL HISTORY: Past Medical History:  Diagnosis Date  . Abnormal Pap smear 2004   colpo  . Alcohol abuse   . Anxiety   . 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 Candida vaginitis 05/2006  . 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 Depression   . H/O candidiasis   . H/O varicella 2005  . IBS (irritable bowel syndrome)   . Panic attack   . Pelvic pain in female 2007    MEDICATIONS: Current Outpatient Prescriptions on File Prior to Visit  Medication Sig Dispense Refill  . ALPRAZolam (XANAX) 1 MG tablet Take 1 mg by mouth 2 (two) times daily as needed.  3  . diphenoxylate-atropine (LOMOTIL) 2.5-0.025 MG tablet Take 1 tablet by mouth 4 (four) times daily as needed for diarrhea or loose stools.    Marland Kitchen doxycycline (VIBRA-TABS) 100 MG tablet Take  100 mg by mouth 2 (two) times daily.    . Ferrous Sulfate (IRON SUPPLEMENT PO) Take by mouth.    Colleen Can FE 1/20 1-20 MG-MCG tablet Take 1 tablet by mouth daily.    . mesalamine (LIALDA) 1.2 g EC tablet Take 1.2 g by mouth daily with breakfast.    . predniSONE  (DELTASONE) 10 MG tablet Take 6tabs x1day, then 5tabs x1day, then 4tabs x1day, then 3tabs x1day, then 2tabs x1day, then 1tab x1day, then STOP 21 tablet 0  . zolpidem (AMBIEN) 10 MG tablet Take 10 mg by mouth at bedtime.  2   No current facility-administered medications on file prior to visit.     ALLERGIES: No Known Allergies  FAMILY HISTORY: Family History  Problem Relation Age of Onset  . Diabetes Mother   . Cancer Sister        Lump removed frfom shoulder  . Heart disease Maternal Grandfather        MI  . Cancer Paternal Grandmother        breast   .  SOCIAL HISTORY: Social History   Social History  . Marital status: Married    Spouse name: N/A  . Number of children: N/A  . Years of education: N/A   Occupational History  . Not on file.   Social History Main Topics  . Smoking status: Former Smoker    Packs/day: 0.50    Types: Cigarettes  . Smokeless tobacco: Never Used  . Alcohol use Yes  . Drug use: No  . Sexual activity: Yes    Partners: Male    Birth control/ protection: Pill     Comment: loestrin fe   Other Topics Concern  . Not on file   Social History Narrative   Lives in [place] with spouse and son age 82.   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    REVIEW OF SYSTEMS: Constitutional: No fevers, chills, or sweats, no generalized fatigue, change in appetite Eyes: No visual changes, double vision, eye pain Ear, nose and throat: No hearing loss, ear pain, nasal congestion, sore throat Cardiovascular: No chest pain, palpitations Respiratory:  No shortness of breath at rest or with exertion, wheezes GastrointestinaI: No nausea, vomiting, diarrhea, abdominal pain, fecal incontinence Genitourinary:  No dysuria, urinary retention or frequency Musculoskeletal:  No neck pain, back pain Integumentary: No rash, pruritus, skin lesions Neurological: as above Psychiatric: No depression, insomnia, anxiety Endocrine: No palpitations, fatigue, diaphoresis,  mood swings, change in appetite, change in weight, increased thirst Hematologic/Lymphatic:  No purpura, petechiae. Allergic/Immunologic: no itchy/runny eyes, nasal congestion, recent allergic reactions, rashes  PHYSICAL EXAM: Vitals:   07/25/16 0745  BP: 110/70  Pulse: 70   General: No acute distress.  Patient appears well-groomed.  normal body habitus. Head:  Normocephalic/atraumatic Eyes:  Fundi examined but not visualized Neck: supple, no paraspinal tenderness, full range of motion Heart:  Regular rate and rhythm Lungs:  Clear to auscultation bilaterally Back: No paraspinal tenderness Neurological Exam: alert and oriented to person, place, and time. Attention span and concentration intact, recent and remote memory intact, fund of knowledge intact.  Speech fluent and not dysarthric, language intact.  CN II-XII intact. Bulk and tone normal, muscle strength 5/5 throughout.  Sensation to light touch intact.  Deep tendon reflexes 2+ throughout, toes downgoing.  Finger to nose and heel to shin testing intact.  Gait normal, Romberg negative.  IMPRESSION: Chronic migraine  PLAN: 1.  To achieve better migraine control, will increase topiramate to 150mg   at bedtime.  In 4 to 6 weeks, she may contact us to adjust dose further if needed. 2.  Ultracet as needed, limited to no more than 2 days out of the week.  She has past history of substance abuse, however abortive medication options are limited.  Due to UC, she cannot take NSAIDs.  Due to history of conduction block, she cannot take triptans.  Tylenol and Excedrin alone are ineffective.  Tramadol has lower addictive potential than other opioids. 3.  Continue lifestyle modification:  Gluten-free diet, hydration, exercise, sleep hygiene 4.  Follow up in 3 to 4 months.  Shon MilletAdam Halcyon Heck, DO  CC: Roxanne MinsMichael Duran, PA-C

## 2016-07-25 NOTE — Patient Instructions (Signed)
1.  Increase topiramate to 150mg  at bedtime.  Contact me in 4 to 6 weeks with update if needed and we can increase/adjust dose 2.  Continue tramadol as needed (limit to no more than 2 days out of the week 3.  Follow up in 3 to 4 months.

## 2016-08-10 MED FILL — ALPRAZolam 1 MG TABS: 1 | 30 days supply | Qty: 60 | Fill #2

## 2016-08-10 MED FILL — ZOLPIDEM TARTRATE 10 MG TAB: 10 | 30 days supply | Qty: 30 | Fill #2

## 2016-09-03 ENCOUNTER — Telehealth: Payer: Self-pay | Admitting: Neurology

## 2016-09-03 MED ORDER — TRAMADOL-ACETAMINOPHEN 37.5-325 MG PO TABS: 1.0000 | ORAL_TABLET | Freq: Four times a day (QID) | ORAL | 5 refills | Status: DC | PRN

## 2016-09-03 MED ORDER — TOPIRAMATE 200 MG PO TABS: 200.0000 mg | ORAL_TABLET | Freq: Every day | ORAL | 1 refills | Status: DC

## 2016-09-03 MED FILL — TRAMADOL-ACETAMINOPHN 37.5-: 37.5-325 | 3 days supply | Qty: 25 | Fill #0

## 2016-09-03 MED FILL — TOPIRAMATE 200 MG TABLET: 200 | 30 days supply | Qty: 30 | Fill #0

## 2016-09-03 NOTE — Telephone Encounter (Signed)
PT called and said she wanted to increase her topamax and call in a prescription for tramadol

## 2016-09-03 NOTE — Telephone Encounter (Signed)
Spoke with patient and told her per Dr. Everlena CooperJaffe I would send in a prescription for Topamax 200mg  at bedtime.  Patient mentioned that at her May appointment her Tramadol did not get sent top pharmacy.  Office note makes it clear she takes this med and could have refill so I sent in a refill for that as well.  Patient will call in 4 weeks with update.

## 2016-09-03 NOTE — Telephone Encounter (Signed)
Office note indicated for her to call if she wanted to increase dosage.   Please advise dosage to increase to and I will send in the prescription.  Thanks!

## 2016-09-03 NOTE — Telephone Encounter (Signed)
Increase topiramate to 200mg  at bedtime.  She may contact us in 4 weeks with update.

## 2016-09-09 ENCOUNTER — Encounter (HOSPITAL_BASED_OUTPATIENT_CLINIC_OR_DEPARTMENT_OTHER): Payer: Self-pay | Admitting: Emergency Medicine

## 2016-09-09 ENCOUNTER — Emergency Department (HOSPITAL_BASED_OUTPATIENT_CLINIC_OR_DEPARTMENT_OTHER)
Admission: EM | Admit: 2016-09-09 | Discharge: 2016-09-09 | Disposition: A | Discharge status: Home / Self Care | Payer: 59 | Attending: Emergency Medicine | Admitting: Emergency Medicine

## 2016-09-09 DIAGNOSIS — M79602 Pain in left arm: Secondary | ICD-10-CM | POA: Insufficient documentation

## 2016-09-09 DIAGNOSIS — M5412 Radiculopathy, cervical region: Secondary | ICD-10-CM | POA: Diagnosis not present

## 2016-09-09 DIAGNOSIS — Z87891 Personal history of nicotine dependence: Secondary | ICD-10-CM | POA: Diagnosis not present

## 2016-09-09 DIAGNOSIS — I471 Supraventricular tachycardia: Secondary | ICD-10-CM | POA: Diagnosis not present

## 2016-09-09 DIAGNOSIS — M25512 Pain in left shoulder: Secondary | ICD-10-CM | POA: Diagnosis present

## 2016-09-09 MED ORDER — METHYLPREDNISOLONE 4 MG PO TBPK: ORAL_TABLET | ORAL | 0 refills | Status: DC

## 2016-09-09 MED ORDER — HYDROCODONE-ACETAMINOPHEN 5-325 MG PO TABS: 1.0000 | ORAL_TABLET | Freq: Four times a day (QID) | ORAL | 0 refills | Status: DC | PRN

## 2016-09-09 MED ORDER — IBUPROFEN 600 MG PO TABS: 600.0000 mg | ORAL_TABLET | Freq: Four times a day (QID) | ORAL | 0 refills | Status: DC | PRN

## 2016-09-09 MED ORDER — HYDROCODONE-ACETAMINOPHEN 5-325 MG PO TABS
1.0000 | ORAL_TABLET | Freq: Once | ORAL | Status: AC
  Administered 2016-09-09: 1 via ORAL
  Filled 2016-09-09: qty 1

## 2016-09-09 MED ORDER — METHOCARBAMOL 500 MG PO TABS: 500.0000 mg | ORAL_TABLET | Freq: Two times a day (BID) | ORAL | 0 refills | Status: DC

## 2016-09-09 NOTE — ED Triage Notes (Signed)
Since Thursday has been having pain and swelling and numbness to left arm and shoulder. States feels like similar pain to shingles . States has been driving long distances like 3300 miles over the past 6 weeks

## 2016-09-09 NOTE — Discharge Instructions (Signed)
Please read and follow all provided instructions.  Your diagnoses today include:  1. Cervical radiculopathy     Tests performed today include: Vital signs. See below for your results today.   Medications prescribed:  Take as prescribed   Home care instructions:  Follow any educational materials contained in this packet.  Follow-up instructions: Please follow-up with your primary care provider for further evaluation of symptoms and treatment   Return instructions:  Please return to the Emergency Department if you do not get better, if you get worse, or new symptoms OR  - Fever (temperature greater than 101.14F)  - Bleeding that does not stop with holding pressure to the area    -Severe pain (please note that you may be more sore the day after your accident)  - Chest Pain  - Difficulty breathing  - Severe nausea or vomiting  - Inability to tolerate food and liquids  - Passing out  - Skin becoming red around your wounds  - Change in mental status (confusion or lethargy)  - New numbness or weakness    Please return if you have any other emergent concerns.  Additional Information:  Your vital signs today were: BP 120/87 (BP Location: Right Arm)    Pulse 68    Temp 97.8 F (36.6 C) (Oral)    Resp 17    Ht 5\' 5"  (1.651 m)    Wt 71.7 kg (158 lb)    LMP 08/22/2016    SpO2 97%    BMI 26.29 kg/m  If your blood pressure (BP) was elevated above 135/85 this visit, please have this repeated by your doctor within one month. ---------------

## 2016-09-09 NOTE — ED Notes (Signed)
Pt voiced concern re: no rx for "Vicodin"; sts. "this is not going to work," referring to  rxs provided. PA notified.

## 2016-09-09 NOTE — ED Notes (Signed)
ED Provider at bedside. 

## 2016-09-09 NOTE — ED Provider Notes (Signed)
[facility] Provider Note   CSN: [id number] Arrival date & time: 09/09/16  0808     History   Chief Complaint Chief Complaint  Patient presents with  . Shoulder Pain    HPI [name] [name] is a 41 y.o. female.  HPI  41 y.o. 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 Pt states that she recently started a new job for the past 6 weeks and has been driving frequently. Noted for the past 1-2 weeks that she started to develop severe burning sensation that radiates from her trapezius down her shoulder to her finger tips. States it is an excruciating pain and feels similar to shingles in the past that she has had in that arm. Rates pain 10/10. ROM of shoulder seems to worsen pain. Denies fevers. No redness or rashes on arm. No fevers. No meds PTA. No other symptoms noted.   Past Medical History:  Diagnosis Date  . Abnormal Pap smear 2004   colpo  . Alcohol abuse   . Anxiety   . 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 Candida vaginitis 05/2006  . 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 Depression   . H/O candidiasis   . H/O varicella 2005  . IBS (irritable bowel syndrome)   . Panic attack   . Pelvic pain in female 2007    Patient Active Problem List   Diagnosis Date Noted  . Morbid obesity (HCC) 12/27/2014  . Overdose 05/23/2014  . Suicidal ideation 05/23/2014  . Alcohol abuse 05/23/2014  . Alcohol abuse with intoxication (HCC) 05/23/2014  . Hypotension 04/26/2014  . Chronic migraine without aura without status migrainosus, not intractable 03/29/2014  . Insomnia 03/29/2014  . Cannot sleep 03/19/2014  . Generalized anxiety disorder 02/11/2013  . Dizziness and giddiness  02/11/2013  . Syncope 02/11/2013  . Complete heart block (HCC) 05/19/2012  . SVT (supraventricular tachycardia) (HCC) 04/09/2012  . Abnormal Pap smear   . H/O candidiasis   . H/O varicella   . Pre-eclampsia   . IBS (irritable bowel syndrome)   . IRRITABLE BOWEL SYNDROME 11/04/2009  . NAUSEA 11/04/2009  . ABDOMINAL PAIN, UNSPECIFIED SITE 11/04/2009  . LIVER FUNCTION TESTS, ABNORMAL, HX OF 11/04/2009  . HEADACHE, CHRONIC 05/16/2007  . DIARRHEA 05/16/2007    Past Surgical History:  Procedure Laterality Date  . CHOLECYSTECTOMY    . EP study and ablation  04/29/12   slow pathway ablation by Dr Johney Frame   . PACEMAKER INSERTION  04/30/12   Medtronic Adapta L implanted by Dr Ladona Ridgel for complete heart block  . PERMANENT PACEMAKER INSERTION N/A 04/30/2012   Procedure: PERMANENT PACEMAKER INSERTION;  Surgeon: Marinus Maw, MD;  Location[facility] CATH LAB;  Service: Cardiovascular;  Laterality: N/A;  . SUPRAVENTRICULAR TACHYCARDIA ABLATION N/A 04/29/2012   Procedure: SUPRAVENTRICULAR TACHYCARDIA ABLATION;  Surgeon: Hillis Range, MD;  Location: [facility] CATH LAB;  Service: Cardiovascular;  Laterality: N/A;  . TONSILLECTOMY  1995    OB History    Gravida Para Term Preterm AB Living   2 1 1     1    SAB TAB Ectopic Multiple Live Births           1       Home Medications  Prior to Admission medications   Medication Sig Start Date End Date Taking? Authorizing Provider  ALPRAZolam Prudy Feeler(XANAX) 1 MG tablet Take 1 mg by mouth 2 (two) times daily as needed. 03/06/16  Yes [provider]  JUNEL FE 1/20 1-20 MG-MCG tablet Take 1 tablet by mouth daily. 09/15/12  Yes [provider]  predniSONE (DELTASONE) 10 MG tablet Take 6tabs x1day, then 5tabs x1day, then 4tabs x1day, then 3tabs x1day, then 2tabs x1day, then 1tab x1day, then STOP 04/03/16  Yes Jaffe, Adam R, DO  topiramate (TOPAMAX) 200 MG tablet Take 1 tablet (200 mg total) by mouth at bedtime. 09/03/16  Yes Jaffe, Adam R, DO    traMADol-acetaminophen (ULTRACET) 37.5-325 MG tablet Take 1-2 tablets by mouth every 6 (six) hours as needed. 09/03/16  Yes Jaffe, Adam R, DO  zolpidem (AMBIEN) 10 MG tablet Take 10 mg by mouth at bedtime. 03/06/16  Yes [provider]  diphenoxylate-atropine (LOMOTIL) 2.5-0.025 MG tablet Take 1 tablet by mouth 4 (four) times daily as needed for diarrhea or loose stools.    [provider]  doxycycline (VIBRA-TABS) 100 MG tablet Take 100 mg by mouth 2 (two) times daily.    [provider]  Ferrous Sulfate (IRON SUPPLEMENT PO) Take by mouth.    [provider]  mesalamine (LIALDA) 1.2 g EC tablet Take 1.2 g by mouth daily with breakfast.    [provider]    Family History Family History  Problem Relation Age of Onset  . Diabetes Mother   . Cancer Sister        Lump removed frfom shoulder  . Heart disease Maternal Grandfather        MI  . Cancer Paternal Grandmother        breast    Social History Social History  Substance Use Topics  . Smoking status: Former Smoker    Packs/day: 0.50    Types: Cigarettes  . Smokeless tobacco: Never Used  . Alcohol use No     Allergies   Patient has no known allergies.   Review of Systems Review of Systems ROS reviewed and all are negative for acute change except as noted in the HPI.  Physical Exam Updated Vital Signs BP 120/87 (BP Location: Right Arm)   Pulse 68   Temp 97.8 F (36.6 C) (Oral)   Resp 17   Ht 5\' 5"  (1.651 m)   Wt 71.7 kg (158 lb)   LMP 08/22/2016   SpO2 97%   BMI 26.29 kg/m   Physical Exam  Constitutional: She is oriented to person, place, and time. Vital signs are normal. She appears well-developed and well-nourished.  Pt tearful  HENT:  Head: Normocephalic and atraumatic.  Right Ear: Hearing normal.  Left Ear: Hearing normal.  Eyes: Conjunctivae and EOM are normal. Pupils are equal, round, and reactive to light.  Neck: Normal range of motion. Neck supple.   Cervical spine ROM intact without discomfort. Very mild TTP along cervical spine along C4-C6. No palpable step offs or deformities.   Cardiovascular: Normal rate and regular rhythm.   Pulmonary/Chest: Effort normal.  Musculoskeletal: Normal range of motion.  Left Shoulder Negative hawkins test, negative Neer's test, no TTP over shoulder or elbow. No pain with flexion/extension/abduction/adduction internal or external rotation. No obvious bony deformity. NVI Distal pulses appreciated   Neurological: She is alert and oriented to person, place, and time. She has normal strength. No sensory deficit.  BUE grip strengths equal. Sensation equal. Moving upper extremities without  difficulty. DTRs intact.   Skin: Skin is warm and dry.  Left arm with no appreciable swelling. No erythema. No shingles rash  Psychiatric: She has a normal mood and affect. Her speech is normal and behavior is normal. Thought content normal.  Nursing note and vitals reviewed.    ED Treatments / Results  Labs (all labs ordered are listed, but only abnormal results are displayed) Labs Reviewed - No data to display  EKG  EKG Interpretation None       Radiology No results found.  Procedures Procedures (including critical care time)  Medications Ordered in ED Medications - No data to display   Initial Impression / Assessment and Plan / ED Course  I have reviewed the triage vital signs and the nursing notes.  Pertinent labs & imaging results that were available during my care of the patient were reviewed by me and considered in my medical decision making (see chart for details).  Final Clinical Impressions(s) / ED Diagnoses     {I have reviewed the relevant previous healthcare records.  {I obtained HPI from historian. {Patient discussed with supervising physician.  ED Course:  Assessment: Pt is a 41 y.o. 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 Pt states that she recently started a new job for the past 6 weeks and has been driving frequently. Noted for the past 1-2 weeks that she started to develop severe burning sensation that radiates from her trapezius down her shoulder to her finger tips. States it is an excruciating pain and feels similar to shingles in the past that she has had in that arm. Rates pain 10/10. ROM of shoulder seems to worsen pain. Denies fevers. No redness or rashes on arm. No fevers. On exam, pt in NAD. Nontoxic/nonseptic appearing. VSS. Afebrile. Musculoskeletal exam benign. No appreciable swelling or erythema. No signs of infection. No shingles rash. Non TTP along biceps and forarm musculature. ROM appears intact. NVI with distal pulses appreciated. Motor/sensation intact. Mild tenderness on cervical spine. Discussed with attending physician who has seen patient. Given anaglesia in ED. Plan is to DC home with follow up to PCP. Likely cervical etiology. Given Muscle relaxants and steroids. I have reviewed the [place] Controlled Substance Reporting System. Given Rx #5 Norco. Encouraged follow up with PCP for further pain medication. At time of discharge, Patient is in no acute distress. Vital Signs are stable. Patient is able to ambulate. Patient able to tolerate PO.     Disposition/Plan:  DC Home Additional Verbal discharge instructions given and discussed with patient.  Pt Instructed to f/u with PCP in the next week for evaluation and treatment of symptoms. Return precautions given Pt acknowledges and agrees with plan  Supervising Physician Tegeler, Canary Brim, *  Final diagnoses:  Cervical radiculopathy    New Prescriptions New Prescriptions   No medications on file     Audry Pili, Cordelia Poche 09/09/16 1038    Tegeler, Canary Brim, MD 09/09/16  [phone]

## 2016-09-10 MED FILL — ALPRAZolam 1 MG TABS: 1 | 30 days supply | Qty: 60 | Fill #3

## 2016-09-10 MED FILL — ZOLPIDEM TARTRATE 10 MG TAB: 10 | 30 days supply | Qty: 30 | Fill #3

## 2016-09-26 MED FILL — GABAPENTIN 100 MG CAPSULE: 100 | 30 days supply | Qty: 90 | Fill #0

## 2016-10-12 MED FILL — TOPIRAMATE 200 MG TABLET: 200 | 30 days supply | Qty: 30 | Fill #1

## 2016-10-12 MED FILL — ZOLPIDEM TARTRATE 10 MG TAB: 10 | 30 days supply | Qty: 30 | Fill #0 | Status: TO

## 2016-10-12 MED FILL — ALPRAZolam 1 MG TABS: 1 | 30 days supply | Qty: 60 | Fill #0 | Status: TO

## 2016-10-12 MED FILL — TRAMADOL-ACETAMINOPHN 37.5-: 37.5-325 | 3 days supply | Qty: 25 | Fill #1 | Status: TO

## 2016-10-16 ENCOUNTER — Encounter: Payer: Self-pay | Admitting: Nurse Practitioner

## 2016-10-17 ENCOUNTER — Ambulatory Visit (INDEPENDENT_AMBULATORY_CARE_PROVIDER_SITE_OTHER): Payer: 59 | Admitting: *Deleted

## 2016-10-17 DIAGNOSIS — I442 Atrioventricular block, complete: Secondary | ICD-10-CM | POA: Diagnosis not present

## 2016-10-17 NOTE — Progress Notes (Signed)
Remote pacemaker transmission.   

## 2016-10-18 ENCOUNTER — Encounter: Payer: Self-pay | Admitting: Cardiology

## 2016-10-19 NOTE — Telephone Encounter (Signed)
Called patient to make sure she received the results of her remote transmission. Patient states that she spoke to Gypsy BalsamAmber Seiler, NP on 10/16/16 and was told that everything was normal. Patient has no further questions at this time.

## 2016-10-24 MED FILL — NORETHIN-ESTRAD-FERR 1-0.02: 1-20 | 84 days supply | Qty: 84 | Fill #1

## 2016-11-05 ENCOUNTER — Encounter: Payer: Self-pay | Admitting: Cardiology

## 2016-11-06 LAB — CUP PACEART REMOTE DEVICE CHECK
Battery Impedance: 397 Ohm
Brady Statistic AP VP Percent: 9 %
Brady Statistic AS VS Percent: 0 %
Date Time Interrogation Session: 20180807212513
Implantable Lead Implant Date: 20140219
Implantable Lead Location: 753860
Lead Channel Impedance Value: 503 Ohm
Lead Channel Pacing Threshold Amplitude: 0.75 V
Lead Channel Setting Pacing Amplitude: 2.5 V
Lead Channel Setting Sensing Sensitivity: 2.8 mV
MDC IDC LEAD IMPLANT DT: 20140219
MDC IDC LEAD LOCATION: 753859
MDC IDC MSMT BATTERY REMAINING LONGEVITY: 87 mo
MDC IDC MSMT BATTERY VOLTAGE: 2.79 V
MDC IDC MSMT LEADCHNL RA IMPEDANCE VALUE: 544 Ohm
MDC IDC MSMT LEADCHNL RA PACING THRESHOLD AMPLITUDE: 0.625 V
MDC IDC MSMT LEADCHNL RA PACING THRESHOLD PULSEWIDTH: 0.4 ms
MDC IDC MSMT LEADCHNL RV PACING THRESHOLD PULSEWIDTH: 0.4 ms
MDC IDC PG IMPLANT DT: 20140219
MDC IDC SET LEADCHNL RA PACING AMPLITUDE: 2 V
MDC IDC SET LEADCHNL RV PACING PULSEWIDTH: 0.46 ms
MDC IDC STAT BRADY AP VS PERCENT: 0 %
MDC IDC STAT BRADY AS VP PERCENT: 91 %

## 2016-11-19 ENCOUNTER — Other Ambulatory Visit: Payer: Self-pay

## 2016-11-19 ENCOUNTER — Telehealth: Payer: Self-pay | Admitting: Neurology

## 2016-11-19 MED ORDER — TOPIRAMATE 200 MG PO TABS
200.0000 mg | ORAL_TABLET | Freq: Every day | ORAL | 1 refills | Status: DC
Start: 2016-11-19 — End: 2017-01-25

## 2016-11-19 MED FILL — TOPIRAMATE 200 MG TABLET: 200 | 30 days supply | Qty: 30 | Fill #0

## 2016-11-19 NOTE — Telephone Encounter (Signed)
Spoke w/Pt, she is completely out of topiramate 200mg , has appt next week, wants to increase dose, advsd her will send in refill, but will need to ask Dr Everlena CooperJaffe about increase.

## 2016-11-19 NOTE — Telephone Encounter (Signed)
Pt called and needs a prescription for Topamax called in but she wants to increase the dosage

## 2016-11-20 NOTE — Telephone Encounter (Signed)
Called Pt advsd will discuss further options at upcoming appt, Pt was fine with this.

## 2016-11-20 NOTE — Telephone Encounter (Signed)
topiramate dosing over  will not likely be more effective.  I would probably add another agent which we can discuss at follow up.

## 2016-11-26 ENCOUNTER — Encounter: Payer: Self-pay | Admitting: Neurology

## 2016-11-26 ENCOUNTER — Ambulatory Visit (INDEPENDENT_AMBULATORY_CARE_PROVIDER_SITE_OTHER): Payer: 59 | Admitting: Neurology

## 2016-11-26 VITALS — BP 110/78 | HR 72 | Ht 65.0 in | Wt 159.7 lb

## 2016-11-26 DIAGNOSIS — B0229 Other postherpetic nervous system involvement: Secondary | ICD-10-CM

## 2016-11-26 DIAGNOSIS — M5412 Radiculopathy, cervical region: Secondary | ICD-10-CM | POA: Diagnosis not present

## 2016-11-26 DIAGNOSIS — G43009 Migraine without aura, not intractable, without status migrainosus: Secondary | ICD-10-CM

## 2016-11-26 DIAGNOSIS — R519 Headache, unspecified: Secondary | ICD-10-CM

## 2016-11-26 DIAGNOSIS — R51 Headache: Secondary | ICD-10-CM

## 2016-11-26 MED ORDER — PREDNISONE 10 MG PO TABS: ORAL_TABLET | ORAL | 0 refills | Status: DC

## 2016-11-26 MED FILL — predniSONE 10 MG TABS: 10 | 6 days supply | Qty: 21 | Fill #0

## 2016-11-26 NOTE — Progress Notes (Signed)
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   UPDATE: She has not had severe migraines but has continued to have a dull constant headache that has picked up over the past week, likely triggered by the weather. Frequency of abortive medication: 1 to 2 days a week Current NSAIDS:  no (contraindicated due to ulcerative colitis) Current analgesics:  Ultracet (minimally effective) Current triptans:  no Current anti-emetic:  no Current muscle relaxants:  no Current anti-anxiolytic:  Xanax Current sleep aide:  Ambien Current Antihypertensive medications:  no Current Antidepressant medications:  no Current Anticonvulsant medications:  topiramate  Current Vitamins/Herbal/Supplements:  no Current Antihistamines/Decongestants:  no Other therapy:  no Other medication:  Junel Fe   Caffeine:  stopped Alcohol:  stopped Smoker:  stopped Diet:  Hydrates with water.  Now Gluten-free, which has helped her UC symptoms and overall well-being. Exercise:  Increased Depression/stress/anxiety:  Improved.  She also presents with a new issue.  Two years ago, she developed Shingles on the left arm.  In June, she developed a sharp burning pain along the same distribution of her Shingles, the left side of her neck down the lateral arm to the entire hand.  After a few days, the pain subsided except for the hand and now she only notes constant pins and needles sensation in the index and lateral aspect of her middle finger.  She did not develop a rash.  She has some neck discomfort.  She denies weakness.  She tried Lyrica, which was too strong.  She  would rather not take a pain medication due to potential drowsiness.   HISTORY:  Onset:  Many years ago.  They resolved but returned about March 2017.  She has had increased depression and anxiety since experiencing cardiac arrest 4 years ago and was diagnosed with heart block.  She also was diagnosed with ulcerative colitis and has been unable to work since August. Location:  Bi-frontal, back of neck Quality:  pounding Intensity:  Constant 6/10 with episodes of 10/10 Aura:  no Prodrome:  no Associated symptoms:  nausea, vomiting, photophobia, phonophobia, vertigo, weakness, paresthesias Duration:  Constant (severe 10/10 fluctuations last 24 to 48 hours) Frequency:  Daily (severe 10/10 fluctuations occur every 10 days) Triggers/exacerbating factors:  noise, light, movement Relieving factors:  Rest in quiet dark room with cool rag Activity:  Aggravated by light activity.  Cannot function at all when severe.   Past abortive therapy:  Midrin, ibuprofen, naproxen, Tylenol, caffeine, Maxalt (slurred speech/difficulty thinking), Percocet, Fioricet, BC Past preventative therapy:  Nortriptyline   Family history of headache:  father   CT of head from 03/23/14 was normal.  PAST MEDICAL HISTORY: Past Medical History:  Diagnosis Date  . Abnormal Pap smear 2004   colpo  . Alcohol abuse   . Anxiety   . 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 Candida vaginitis 05/2006  . 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 Depression   . H/O candidiasis   . H/O varicella 2005  . IBS (irritable bowel syndrome)   . Panic attack   . Pelvic pain in female 2007  MEDICATIONS: Current Outpatient Prescriptions on File Prior to Visit  Medication Sig Dispense Refill  . ALPRAZolam (XANAX) 1 MG tablet Take 1 mg by mouth 2 (two) times daily as needed.  3  . Ferrous Sulfate (IRON SUPPLEMENT PO) Take by mouth.    Marland Kitchen ibuprofen  (ADVIL,MOTRIN) 600 MG tablet Take 1 tablet (600 mg total) by mouth every 6 (six) hours as needed. 30 tablet 0  . JUNEL FE 1/20 1-20 MG-MCG tablet Take 1 tablet by mouth daily.    Marland Kitchen topiramate (TOPAMAX) 200 MG tablet Take 1 tablet (200 mg total) by mouth at bedtime. 30 tablet 1  . traMADol-acetaminophen (ULTRACET) 37.5-325 MG tablet Take 1-2 tablets by mouth every 6 (six) hours as needed. 25 tablet 5  . zolpidem (AMBIEN) 10 MG tablet Take 10 mg by mouth at bedtime.  2   No current facility-administered medications on file prior to visit.     ALLERGIES: No Known Allergies  FAMILY HISTORY: Family History  Problem Relation Age of Onset  . Diabetes Mother   . Cancer Sister        Lump removed frfom shoulder  . Heart disease Maternal Grandfather        MI  . Cancer Paternal Grandmother        breast    SOCIAL HISTORY: Social History   Social History  . Marital status: Married    Spouse name: N/A  . Number of children: N/A  . Years of education: N/A   Occupational History  . Not on file.   Social History Main Topics  . Smoking status: Former Smoker    Packs/day: 0.50    Types: Cigarettes  . Smokeless tobacco: Never Used  . Alcohol use No  . Drug use: No  . Sexual activity: Yes    Partners: Male    Birth control/ protection: Pill     Comment: loestrin fe   Other Topics Concern  . Not on file   Social History Narrative   Lives in [place] with spouse and son age 58.   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    REVIEW OF SYSTEMS: Constitutional: No fevers, chills, or sweats, no generalized fatigue, change in appetite Eyes: No visual changes, double vision, eye pain Ear, nose and throat: No hearing loss, ear pain, nasal congestion, sore throat Cardiovascular: No chest pain, palpitations Respiratory:  No shortness of breath at rest or with exertion, wheezes GastrointestinaI: No nausea, vomiting, diarrhea, abdominal pain, fecal incontinence Genitourinary:  No  dysuria, urinary retention or frequency Musculoskeletal:  No neck pain, back pain Integumentary: No rash, pruritus, skin lesions Neurological: as above Psychiatric: No depression, insomnia, anxiety Endocrine: No palpitations, fatigue, diaphoresis, mood swings, change in appetite, change in weight, increased thirst Hematologic/Lymphatic:  No purpura, petechiae. Allergic/Immunologic: no itchy/runny eyes, nasal congestion, recent allergic reactions, rashes  PHYSICAL EXAM: Vitals:   11/26/16 0752  BP: 110/78  Pulse: 72  SpO2: 99%   General: No acute distress.  Patient appears well-groomed.   Head:  Normocephalic/atraumatic Eyes:  Fundi examined but not visualized Neck: supple, no paraspinal tenderness, full range of motion Heart:  Regular rate and rhythm Lungs:  Clear to auscultation bilaterally Back: No paraspinal tenderness Neurological Exam: alert and oriented to person, place, and time. Attention span and concentration intact, recent and remote memory intact, fund of knowledge intact.  Speech fluent and not dysarthric, language intact.  CN II-XII intact. Bulk and tone normal, muscle strength 5/5 throughout.  Sensation to pinprick and vibration  intact but notes allodynia along the left lateral upper and lower arm as well as first 3 digits of left hand..  Deep tendon reflexes 2+ throughout, toes downgoing.  Finger to nose and heel to shin testing intact.  Gait normal, Romberg negative.  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   PLAN: 1.  Prednisone taper to break current daily headache and possibly improve neuralgia. 2.  Even though I suspect radicular pain related to post-herpetic neuralgia, I would like to get MRI of cervical spine with and without contrast to rule out a structural etiology (disc protrusion for example), which may change management.  She has  failed pharmacologic therapy and I don't believe physical therapy would be helpful. 3.  Continue topiramate  at bedtime.  4.  Consider magnesium citrate, turmeric, riboflavin and coenyzme Q10 5.  Ultracet as needed. 6.  If neuralgia does not improve, consider referral to interventional pain management (for possible block) as she would rather not take an oral pain medication. 7.  Follow up in 4 months.  40 minutes spent face to face with patient, over 50% spent discussing diagnosis and management.  [name], DO  CC:  Roxanne Mins, PA-C

## 2016-11-26 NOTE — Patient Instructions (Signed)
1.  I will prescribe you a prednisone taper to try and break current constant headache.  It may also help with the nerve pain.   Prednisone :  Take 6tabs x1day, then 5tabs x1day, then 4tabs x1day, then 3tabs x1day, then 2tabs x1day, then 1tab x1day, then STOP 2.  Consider supplements:  Magnesium citrate  to  daily, turmeric  once daily, riboflavin  once daily, Coenzyme Q 10  three times daily 3.  I agree that the nerve pain is the shingles pain acting up again.  However, we will get MRI of cervical spine to look for a structural cause of the nerve pain (such as bulging disc) which may change management. 4.  Follow up in 4 months.

## 2016-12-04 ENCOUNTER — Telehealth: Payer: Self-pay | Admitting: Neurology

## 2016-12-04 NOTE — Telephone Encounter (Signed)
Patient states that she has pacemaker and cant get her MRI done at [facility] we would need to resch the MRI at [facility] or some where else

## 2016-12-05 NOTE — Telephone Encounter (Signed)
Called [facility] scheduling, spoke with Lyla Son, she advsd I will need pacemaker as well as lead information to ensure is compatable, need to fax to #[phone] or [phone]. Called Pt, LM on VM for her to rtrn my call.

## 2016-12-06 NOTE — Telephone Encounter (Signed)
Returning your call. °

## 2016-12-07 ENCOUNTER — Encounter: Payer: Self-pay | Admitting: Nurse Practitioner

## 2016-12-07 NOTE — Telephone Encounter (Signed)
Spoke with Pt, she is to call her cardiologist office and have them send the information to [facility] at the scheduling department, gave her the fax#, though as her cardiologist is a part of [facility], the information should be available to them already.

## 2016-12-10 ENCOUNTER — Encounter: Payer: Self-pay | Admitting: Cardiology

## 2016-12-14 ENCOUNTER — Encounter: Payer: Self-pay | Admitting: Neurology

## 2016-12-19 ENCOUNTER — Other Ambulatory Visit: Payer: Self-pay

## 2016-12-19 DIAGNOSIS — R200 Anesthesia of skin: Secondary | ICD-10-CM

## 2016-12-24 MED FILL — TOPIRAMATE 200 MG TABLET: 200 | 30 days supply | Qty: 30 | Fill #1

## 2016-12-26 ENCOUNTER — Ambulatory Visit
Admission: RE | Admit: 2016-12-26 | Discharge: 2016-12-26 | Disposition: A | Discharge status: Home / Self Care | Payer: 59 | Source: Ambulatory Visit | Attending: Neurology | Admitting: Neurology

## 2016-12-26 ENCOUNTER — Other Ambulatory Visit: Payer: Self-pay | Admitting: Neurology

## 2016-12-26 DIAGNOSIS — R200 Anesthesia of skin: Secondary | ICD-10-CM

## 2016-12-28 ENCOUNTER — Other Ambulatory Visit: Payer: Self-pay

## 2016-12-28 ENCOUNTER — Telehealth: Payer: Self-pay

## 2016-12-28 DIAGNOSIS — M542 Cervicalgia: Secondary | ICD-10-CM

## 2016-12-28 NOTE — Telephone Encounter (Signed)
-----   Message from Drema DallasAdam R Jaffe, DO sent at 12/27/2016  7:17 AM EDT ----- X-ray does show some disc degeneration at C6-7.  I would like to refer her for nerve conduction study of the left arm to evaluate for any evidence of a  Pinched nerve in the neck at that level.

## 2016-12-28 NOTE — Telephone Encounter (Signed)
Called and spoke with Pt, advsd her of xray results and recommendation for EMG, added orders and trx her to GreenbackDana in reception to make appt for EMG

## 2017-01-16 ENCOUNTER — Ambulatory Visit (INDEPENDENT_AMBULATORY_CARE_PROVIDER_SITE_OTHER): Payer: 59 | Admitting: *Deleted

## 2017-01-16 ENCOUNTER — Telehealth: Payer: Self-pay | Admitting: Cardiology

## 2017-01-16 DIAGNOSIS — I442 Atrioventricular block, complete: Secondary | ICD-10-CM | POA: Diagnosis not present

## 2017-01-16 NOTE — Telephone Encounter (Signed)
Spoke with pt and reminded pt of remote transmission that is due today. Pt verbalized understanding.   

## 2017-01-17 ENCOUNTER — Ambulatory Visit (INDEPENDENT_AMBULATORY_CARE_PROVIDER_SITE_OTHER): Payer: 59 | Admitting: Neurology

## 2017-01-17 ENCOUNTER — Encounter: Payer: Self-pay | Admitting: Cardiology

## 2017-01-17 DIAGNOSIS — M542 Cervicalgia: Secondary | ICD-10-CM

## 2017-01-17 DIAGNOSIS — G5602 Carpal tunnel syndrome, left upper limb: Secondary | ICD-10-CM

## 2017-01-17 DIAGNOSIS — M5412 Radiculopathy, cervical region: Secondary | ICD-10-CM

## 2017-01-17 NOTE — Progress Notes (Signed)
Remote pacemaker transmission.   

## 2017-01-17 NOTE — Procedures (Signed)
[facility]  [address], [address]  [place], [place] [address] Tel: [phone] Fax:  [phone] Test Date:  01/17/2017  Patient: [name] ReamerKatherine Tijerina DOB: [birth year] Physician: Nita Sickleonika Hawraa Stambaugh, DO  Sex: Female Height: 5\' 5"  Ref Phys: Shon MilletAdam Jaffe, DO  ID#: [id number] Temp: 35.7C Technician:    Patient Complaints: This is a 41 year old female referred for evaluation of left arm pain and paresthesias.  NCV & EMG Findings: Extensive electrodiagnostic testing of the left upper extremity shows:  1. Left median and ulnar sensory responses are within normal limits. Left mixed palmar sensory responses are abnormal.  2. Left median and ulnar motor responses are within normal limits.  3. Chronic motor axon loss changes are seen affecting the pronator teres and triceps muscles, without accompanied active denervation.    Impression: 1. Chronic C7 radiculopathy affecting the left upper extremity, mild in degree electrically. 2. Right median neuropathy at or distal to the wrist, consistent with the clinical diagnosis of carpal tunnel syndrome; very mild in degree electrically.   ___________________________ Nita Sickleonika Romari Gasparro, DO    Nerve Conduction Studies Anti Sensory Summary Table   Site NR Peak (ms) Norm Peak (ms) P-T Amp (V) Norm P-T Amp  Left Median Anti Sensory (2nd Digit)  35.7C  Wrist    2.5 <3.4 59.1 >20  Left Ulnar Anti Sensory (5th Digit)  35.7C  Wrist    2.3 <3.1 48.6 >12   Motor Summary Table   Site NR Onset (ms) Norm Onset (ms) O-P Amp (mV) Norm O-P Amp Site1 Site2 Delta-0 (ms) Dist (cm) Vel (m/s) Norm Vel (m/s)  Left Median Motor (Abd Poll Brev)  35.7C  Wrist    2.5 <3.9 10.3 >6 Elbow Wrist 4.0 26.5 66 >50  Elbow    6.5  10.1         Left Ulnar Motor (Abd Dig Minimi)  35.7C  Wrist    2.3 <3.1 10.2 >7 B Elbow Wrist 2.8 22.0 79 >50  B Elbow    5.1  10.2  A Elbow B Elbow 1.5 10.0 67 >50  A Elbow    6.6  10.0          Comparison Summary Table   Site NR  Peak (ms) Norm Peak (ms) P-T Amp (V) Site1 Site2 Delta-P (ms) Norm Delta (ms)  Left Median/Ulnar Palm Comparison (Wrist - 8cm)  35.7C  Median Palm    1.5 <2.2 65.1 Median Palm Ulnar Palm 0.4   Ulnar Palm    1.1 <2.2 23.0       EMG   Side Muscle Ins Act Fibs Psw Fasc Number Recrt Dur Dur. Amp Amp. Poly Poly. Comment  Left 1stDorInt Nml Nml Nml Nml Nml Nml Nml Nml Nml Nml Nml Nml N/A  Left Abd Poll Brev Nml Nml Nml Nml Nml Nml Nml Nml Nml Nml Nml Nml N/A  Left PronatorTeres Nml Nml Nml Nml 1- Rapid Some 1+ Some 1+ Nml Nml N/A  Left Biceps Nml Nml Nml Nml Nml Nml Nml Nml Nml Nml Nml Nml N/A  Left Triceps Nml Nml Nml Nml 1- Rapid Some 1+ Some 1+ Nml Nml N/A  Left Deltoid Nml Nml Nml Nml Nml Nml Nml Nml Nml Nml Nml Nml N/A      Waveforms:

## 2017-01-21 LAB — CUP PACEART REMOTE DEVICE CHECK
Battery Impedance: 471 Ohm
Battery Remaining Longevity: 83 mo
Brady Statistic AS VS Percent: 0 %
Date Time Interrogation Session: 20181107232220
Implantable Lead Implant Date: 20140219
Implantable Lead Implant Date: 20140219
Implantable Lead Location: 753860
Implantable Lead Model: 5076
Implantable Pulse Generator Implant Date: 20140219
Lead Channel Impedance Value: 544 Ohm
Lead Channel Setting Pacing Amplitude: 2 V
Lead Channel Setting Pacing Amplitude: 2.5 V
Lead Channel Setting Pacing Pulse Width: 0.46 ms
Lead Channel Setting Sensing Sensitivity: 2.8 mV
MDC IDC LEAD LOCATION: 753859
MDC IDC MSMT BATTERY VOLTAGE: 2.79 V
MDC IDC MSMT LEADCHNL RA IMPEDANCE VALUE: 562 Ohm
MDC IDC MSMT LEADCHNL RA PACING THRESHOLD AMPLITUDE: 0.625 V
MDC IDC MSMT LEADCHNL RA PACING THRESHOLD PULSEWIDTH: 0.4 ms
MDC IDC MSMT LEADCHNL RV PACING THRESHOLD AMPLITUDE: 1 V
MDC IDC MSMT LEADCHNL RV PACING THRESHOLD PULSEWIDTH: 0.4 ms
MDC IDC STAT BRADY AP VP PERCENT: 8 %
MDC IDC STAT BRADY AP VS PERCENT: 0 %
MDC IDC STAT BRADY AS VP PERCENT: 92 %

## 2017-01-24 ENCOUNTER — Encounter: Payer: Self-pay | Admitting: Neurology

## 2017-01-25 ENCOUNTER — Other Ambulatory Visit: Payer: Self-pay

## 2017-01-25 ENCOUNTER — Other Ambulatory Visit: Payer: Self-pay | Admitting: Neurology

## 2017-01-25 MED ORDER — GABAPENTIN 100 MG PO CAPS: 100.0000 mg | ORAL_CAPSULE | Freq: Three times a day (TID) | ORAL | 3 refills | Status: DC

## 2017-01-25 MED FILL — TOPIRAMATE 200 MG TABLET: 200 | 30 days supply | Qty: 30 | Fill #0

## 2017-01-25 MED FILL — GABAPENTIN 100 MG CAPSULE: 100 | 90 days supply | Qty: 270 | Fill #0

## 2017-01-30 ENCOUNTER — Other Ambulatory Visit: Payer: Self-pay

## 2017-01-30 DIAGNOSIS — M542 Cervicalgia: Secondary | ICD-10-CM

## 2017-02-06 ENCOUNTER — Encounter: Payer: Self-pay | Admitting: Neurology

## 2017-02-26 MED FILL — TOPIRAMATE 200 MG TABLET: 200 | 30 days supply | Qty: 30 | Fill #1

## 2017-03-19 ENCOUNTER — Encounter: Payer: Self-pay | Admitting: Physical Medicine & Rehabilitation

## 2017-03-21 MED FILL — LARIN FE 1-20 TABLET: 1-20 | 84 days supply | Qty: 84 | Fill #0 | Status: TO

## 2017-03-25 ENCOUNTER — Other Ambulatory Visit: Payer: Self-pay | Admitting: Neurology

## 2017-03-26 MED FILL — TRAMADOL-ACETAMINOPHN 37.5-: 37.5-325 | 3 days supply | Qty: 25 | Fill #0

## 2017-03-28 ENCOUNTER — Ambulatory Visit: Payer: Self-pay | Admitting: Neurology

## 2017-04-03 MED FILL — TOPIRAMATE 200 MG TABLET: 200 | 30 days supply | Qty: 30 | Fill #2

## 2017-04-03 MED FILL — FLUCONAZOLE 150 MG TABLET: 150 | 3 days supply | Qty: 3 | Fill #0

## 2017-04-03 MED FILL — AZITHROMYCIN 250 MG TABLET: 250 | 5 days supply | Qty: 6 | Fill #0

## 2017-04-03 MED FILL — BENZONATATE 100 MG CAPSULE: 100 | 7 days supply | Qty: 21 | Fill #0

## 2017-04-04 ENCOUNTER — Ambulatory Visit: Payer: Self-pay

## 2017-04-08 ENCOUNTER — Ambulatory Visit (HOSPITAL_BASED_OUTPATIENT_CLINIC_OR_DEPARTMENT_OTHER): Payer: 59 | Admitting: Physical Medicine & Rehabilitation

## 2017-04-08 ENCOUNTER — Encounter: Payer: 59 | Attending: Physical Medicine & Rehabilitation

## 2017-04-08 ENCOUNTER — Encounter: Payer: Self-pay | Admitting: Physical Medicine & Rehabilitation

## 2017-04-08 VITALS — BP 111/75 | HR 76

## 2017-04-08 DIAGNOSIS — Z8249 Family history of ischemic heart disease and other diseases of the circulatory system: Secondary | ICD-10-CM | POA: Insufficient documentation

## 2017-04-08 DIAGNOSIS — Z833 Family history of diabetes mellitus: Secondary | ICD-10-CM | POA: Insufficient documentation

## 2017-04-08 DIAGNOSIS — Z87891 Personal history of nicotine dependence: Secondary | ICD-10-CM | POA: Insufficient documentation

## 2017-04-08 DIAGNOSIS — M5412 Radiculopathy, cervical region: Secondary | ICD-10-CM | POA: Diagnosis present

## 2017-04-08 DIAGNOSIS — Z803 Family history of malignant neoplasm of breast: Secondary | ICD-10-CM | POA: Insufficient documentation

## 2017-04-08 DIAGNOSIS — Z9049 Acquired absence of other specified parts of digestive tract: Secondary | ICD-10-CM | POA: Diagnosis not present

## 2017-04-08 DIAGNOSIS — Z950 Presence of cardiac pacemaker: Secondary | ICD-10-CM | POA: Diagnosis not present

## 2017-04-08 DIAGNOSIS — K589 Irritable bowel syndrome without diarrhea: Secondary | ICD-10-CM | POA: Diagnosis not present

## 2017-04-08 DIAGNOSIS — Z9889 Other specified postprocedural states: Secondary | ICD-10-CM | POA: Insufficient documentation

## 2017-04-08 DIAGNOSIS — Z808 Family history of malignant neoplasm of other organs or systems: Secondary | ICD-10-CM | POA: Insufficient documentation

## 2017-04-08 DIAGNOSIS — Z8619 Personal history of other infectious and parasitic diseases: Secondary | ICD-10-CM | POA: Diagnosis not present

## 2017-04-08 NOTE — Patient Instructions (Signed)
Increase Gabapentin to 3 tabs 3 times a day for three days If no better then 4 tabs, 3 times a day, if still no better please call for further instructions  [facility] , they will call to schedule

## 2017-04-08 NOTE — Progress Notes (Signed)
Subjective:    Patient ID: [name] [name], female    DOB: Jan 06, 1976, 42 y.o.   MRN: [id number] CC:  Neck and Left arm pain Consult requested by Dr. [name] , [place] [facility] HPI 42 yo with prior hx of shingles in 2016 and chronic migraine headaches who developed Left shoulder pain and neck pain in June 2018 that radiates to Left first dorsal web space. Left index finger pain and numbness.  Patient's upper extremity pain is worse than neck pain Pain has gotten better with time, however the numbness in the index finger as well as the thumb is very irritating. Dr. Everlena Cooper prescribed prednisone burst and taper  Tried Lyrica High dose but made her groggy Has tried gabapentin at low-dose which was not helpful but did not give her any side effects.    Pain Inventory Average Pain 5 Pain Right Now 5 My pain is constant, tingling and aching  In the last 24 hours, has pain interfered with the following? General activity 5 Relation with others 3 Enjoyment of life 4 What TIME of day is your pain at its worst? all Sleep (in general) Good  Pain is worse with: . Pain improves with: . Relief from Meds: 0  Mobility walk without assistance ability to climb steps?  yes do you drive?  yes  Function employed # of hrs/week .  Neuro/Psych tingling  Prior Studies Any changes since last visit?  no  Physicians involved in your care Any changes since last visit?  no   Family History  Problem Relation Age of Onset  . Diabetes Mother   . Cancer Sister        Lump removed frfom shoulder  . Heart disease Maternal Grandfather        MI  . Cancer Paternal Grandmother        breast   Social History   Socioeconomic History  . Marital status: Married    Spouse name: None  . Number of children: None  . Years of education: None  . Highest education level: None  Social Needs  . Financial resource strain: None  . Food insecurity - worry: None  . Food insecurity - inability:  None  . Transportation needs - medical: None  . Transportation needs - non-medical: None  Occupational History  . None  Tobacco Use  . Smoking status: Former Smoker    Packs/day: 0.50    Types: Cigarettes  . Smokeless tobacco: Never Used  Substance and Sexual Activity  . Alcohol use: No    Comment: less then social  . Drug use: No  . Sexual activity: Yes    Partners: Male    Birth control/protection: Pill    Comment: loestrin fe  Other Topics Concern  . None  Social History Narrative   Lives in [place] with spouse and son age 29.   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 CHOLECYSTECTOMY    . EP study and ablation  04/29/12   slow pathway ablation by Dr Johney Frame   . PACEMAKER INSERTION  04/30/12   Medtronic Adapta L implanted by Dr Ladona Ridgel for complete heart block  . PERMANENT PACEMAKER INSERTION N/A 04/30/2012   Procedure: PERMANENT PACEMAKER INSERTION;  Surgeon: Marinus Maw, MD;  Location: [facility] CATH LAB;  Service: Cardiovascular;  Laterality: N/A;  . SUPRAVENTRICULAR TACHYCARDIA ABLATION N/A 04/29/2012   Procedure: SUPRAVENTRICULAR TACHYCARDIA ABLATION;  Surgeon: Hillis Range, MD;  Location: [facility]  CATH LAB;  Service: Cardiovascular;  Laterality: N/A;  . TONSILLECTOMY  1995   Past Medical History:  Diagnosis Date  . Abnormal Pap smear 2004   colpo  . Alcohol abuse   . Anxiety   . 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 Candida vaginitis 05/2006  . 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 Depression   . H/O candidiasis   . H/O varicella 2005  . IBS (irritable bowel syndrome)   . Panic attack   . Pelvic pain in female 2007   BP 111/75   Pulse 76   LMP 03/18/2017   SpO2 99%   Opioid Risk Score:   Fall Risk Score:  `1  Depression screen PHQ 2/9  Depression screen [facility] 2/9 05/28/2013 01/14/2013  Decreased Interest 2 0  Down, Depressed,  Hopeless 2 0  PHQ - 2 Score 4 0  Altered sleeping 3 -  Tired, decreased energy 2 -  Change in appetite 3 -  Feeling bad or failure about yourself  3 -  Trouble concentrating 2 -  Moving slowly or fidgety/restless 1 -  Suicidal thoughts 2 -  PHQ-9 Score 20 -     Review of Systems  Constitutional: Negative.   HENT: Negative.   Eyes: Negative.   Respiratory: Positive for shortness of breath.   Cardiovascular: Negative.   Gastrointestinal: Negative.   Endocrine: Negative.   Genitourinary: Negative.   Musculoskeletal: Positive for joint swelling.  Skin: Negative.   Allergic/Immunologic: Negative.   Neurological: Negative.   Hematological: Negative.   Psychiatric/Behavioral: Negative.   All other systems reviewed and are negative.      Objective:   Physical Exam  Constitutional: She is oriented to person, place, and time. She appears well-developed and well-nourished. No distress.  HENT:  Head: Normocephalic and atraumatic.  Eyes: Conjunctivae and EOM are normal. Pupils are equal, round, and reactive to light.  Neck: Normal range of motion.  Cardiovascular: Normal rate, regular rhythm and normal heart sounds. Exam reveals no friction rub.  No murmur heard. Left upper chest AICD site appears normal  Pulmonary/Chest: Effort normal and breath sounds normal. No respiratory distress. She has no wheezes.  Abdominal: Soft. Bowel sounds are normal. She exhibits no distension. There is no tenderness.  Musculoskeletal: Normal range of motion.  Normal upper extremity and lower extremity range of motion Left shoulder negative impingement sign No swelling or effusion noted in the hand or wrist.  No pain with range of motion There is pain with light palpation of the index finger   Neurological: She is alert and oriented to person, place, and time. She displays no atrophy. Gait normal.  Reflex Scores:      Tricep reflexes are 1+ on the right side and 1+ on the left side.      Bicep  reflexes are 1+ on the right side and 1+ on the left side.      Brachioradialis reflexes are 1+ on the right side and 1+ on the left side.      Patellar reflexes are 1+ on the right side and 1+ on the left side.      Achilles reflexes are 1+ on the right side and 1+ on the left side. Motor strength is 5/5 bilateral deltoid, bicep, tricep, grip, hip flexor, knee extensor, ankle dose flexor  Skin: She is not diaphoretic.  Psychiatric: She has a normal mood and affect.  Nursing  note and vitals reviewed.         Assessment & Plan:  #1.  Neck pain and upper extremity pain.  Her symptoms and physical exam is most consistent with left C6 distribution.  EMG/NCV demonstrated chronic left C7 radic. X-ray showed C6-C7 degenerative disc.  Unable to get MRI secondary to implantable defibrillator.  Will order CT of the cervical spine without contrast. Recommend increase gabapentin to 300 mg 3 times daily try for 3 days if no better increased to 400 mg 3 times daily.  And if still no better call and we will need to call in 600 mg tablets. Monitor for side effects including sedation. Depending on cervical CT findings may need cervical epidural versus neurosurgical consultation.

## 2017-04-08 NOTE — Progress Notes (Signed)
No test needed as stated by Dr. Bryson DamesKirstiens.

## 2017-04-17 ENCOUNTER — Ambulatory Visit (INDEPENDENT_AMBULATORY_CARE_PROVIDER_SITE_OTHER): Payer: 59 | Admitting: *Deleted

## 2017-04-17 ENCOUNTER — Telehealth: Payer: Self-pay | Admitting: Cardiology

## 2017-04-17 DIAGNOSIS — I442 Atrioventricular block, complete: Secondary | ICD-10-CM

## 2017-04-17 NOTE — Telephone Encounter (Signed)
LMOVM reminding pt to send remote transmission.   

## 2017-04-18 ENCOUNTER — Encounter: Payer: Self-pay | Admitting: Cardiology

## 2017-04-18 NOTE — Progress Notes (Signed)
Remote pacemaker transmission.   

## 2017-04-23 ENCOUNTER — Encounter: Payer: 59 | Attending: Physical Medicine & Rehabilitation

## 2017-04-23 ENCOUNTER — Ambulatory Visit: Payer: Self-pay | Admitting: Physical Medicine & Rehabilitation

## 2017-04-23 DIAGNOSIS — M5412 Radiculopathy, cervical region: Secondary | ICD-10-CM | POA: Insufficient documentation

## 2017-04-23 DIAGNOSIS — Z8249 Family history of ischemic heart disease and other diseases of the circulatory system: Secondary | ICD-10-CM | POA: Insufficient documentation

## 2017-04-23 DIAGNOSIS — Z950 Presence of cardiac pacemaker: Secondary | ICD-10-CM | POA: Insufficient documentation

## 2017-04-23 DIAGNOSIS — Z9889 Other specified postprocedural states: Secondary | ICD-10-CM | POA: Insufficient documentation

## 2017-04-23 DIAGNOSIS — Z87891 Personal history of nicotine dependence: Secondary | ICD-10-CM | POA: Insufficient documentation

## 2017-04-23 DIAGNOSIS — Z808 Family history of malignant neoplasm of other organs or systems: Secondary | ICD-10-CM | POA: Insufficient documentation

## 2017-04-23 DIAGNOSIS — Z8619 Personal history of other infectious and parasitic diseases: Secondary | ICD-10-CM | POA: Insufficient documentation

## 2017-04-23 DIAGNOSIS — Z803 Family history of malignant neoplasm of breast: Secondary | ICD-10-CM | POA: Insufficient documentation

## 2017-04-23 DIAGNOSIS — K589 Irritable bowel syndrome without diarrhea: Secondary | ICD-10-CM | POA: Insufficient documentation

## 2017-04-23 DIAGNOSIS — Z9049 Acquired absence of other specified parts of digestive tract: Secondary | ICD-10-CM | POA: Insufficient documentation

## 2017-04-23 DIAGNOSIS — Z833 Family history of diabetes mellitus: Secondary | ICD-10-CM | POA: Insufficient documentation

## 2017-05-09 LAB — CUP PACEART REMOTE DEVICE CHECK
Brady Statistic AP VP Percent: 8 %
Brady Statistic AP VS Percent: 0 %
Brady Statistic AS VS Percent: 0 %
Date Time Interrogation Session: 20190207022603
Implantable Lead Implant Date: 20140219
Lead Channel Impedance Value: 488 Ohm
Lead Channel Impedance Value: 512 Ohm
Lead Channel Pacing Threshold Amplitude: 0.625 V
Lead Channel Pacing Threshold Amplitude: 0.75 V
Lead Channel Setting Sensing Sensitivity: 2.8 mV
MDC IDC LEAD IMPLANT DT: 20140219
MDC IDC LEAD LOCATION: 753859
MDC IDC LEAD LOCATION: 753860
MDC IDC MSMT BATTERY IMPEDANCE: 496 Ohm
MDC IDC MSMT BATTERY REMAINING LONGEVITY: 79 mo
MDC IDC MSMT BATTERY VOLTAGE: 2.79 V
MDC IDC MSMT LEADCHNL RA PACING THRESHOLD PULSEWIDTH: 0.4 ms
MDC IDC MSMT LEADCHNL RV PACING THRESHOLD PULSEWIDTH: 0.4 ms
MDC IDC PG IMPLANT DT: 20140219
MDC IDC SET LEADCHNL RA PACING AMPLITUDE: 2 V
MDC IDC SET LEADCHNL RV PACING AMPLITUDE: 2.5 V
MDC IDC SET LEADCHNL RV PACING PULSEWIDTH: 0.46 ms
MDC IDC STAT BRADY AS VP PERCENT: 92 %

## 2017-05-13 MED FILL — TOPIRAMATE 200 MG TABLET: 200 | 30 days supply | Qty: 30 | Fill #3

## 2017-06-13 MED FILL — LARIN FE 1-20 TABLET: 1-20 | 84 days supply | Qty: 84 | Fill #1 | Status: TO

## 2017-06-17 MED FILL — TOPIRAMATE 200 MG TABLET: 200 | 30 days supply | Qty: 30 | Fill #4

## 2017-06-19 MED FILL — GABAPENTIN 100 MG CAPSULE: 100 | 30 days supply | Qty: 90 | Fill #1

## 2017-07-17 ENCOUNTER — Ambulatory Visit (INDEPENDENT_AMBULATORY_CARE_PROVIDER_SITE_OTHER): Payer: 59 | Admitting: *Deleted

## 2017-07-17 DIAGNOSIS — I442 Atrioventricular block, complete: Secondary | ICD-10-CM | POA: Diagnosis not present

## 2017-07-17 NOTE — Progress Notes (Signed)
Remote pacemaker transmission.   

## 2017-07-18 ENCOUNTER — Encounter: Payer: Self-pay | Admitting: Cardiology

## 2017-07-22 MED FILL — TOPIRAMATE 200 MG TABLET: 200 | 30 days supply | Qty: 30 | Fill #5

## 2017-08-13 LAB — CUP PACEART REMOTE DEVICE CHECK
Battery Impedance: 570 Ohm
Brady Statistic AP VP Percent: 7 %
Brady Statistic AP VS Percent: 0 %
Brady Statistic AS VS Percent: 0 %
Date Time Interrogation Session: 20190508021451
Implantable Lead Implant Date: 20140219
Implantable Lead Location: 753860
Implantable Lead Model: 5076
Lead Channel Impedance Value: 516 Ohm
Lead Channel Pacing Threshold Pulse Width: 0.4 ms
Lead Channel Setting Pacing Amplitude: 2.5 V
MDC IDC LEAD IMPLANT DT: 20140219
MDC IDC LEAD LOCATION: 753859
MDC IDC MSMT BATTERY REMAINING LONGEVITY: 75 mo
MDC IDC MSMT BATTERY VOLTAGE: 2.79 V
MDC IDC MSMT LEADCHNL RA IMPEDANCE VALUE: 505 Ohm
MDC IDC MSMT LEADCHNL RA PACING THRESHOLD AMPLITUDE: 0.625 V
MDC IDC MSMT LEADCHNL RV PACING THRESHOLD AMPLITUDE: 0.625 V
MDC IDC MSMT LEADCHNL RV PACING THRESHOLD PULSEWIDTH: 0.4 ms
MDC IDC PG IMPLANT DT: 20140219
MDC IDC SET LEADCHNL RA PACING AMPLITUDE: 2 V
MDC IDC SET LEADCHNL RV PACING PULSEWIDTH: 0.46 ms
MDC IDC SET LEADCHNL RV SENSING SENSITIVITY: 2.8 mV
MDC IDC STAT BRADY AS VP PERCENT: 93 %

## 2017-08-23 ENCOUNTER — Other Ambulatory Visit: Payer: Self-pay | Admitting: Neurology

## 2017-08-23 MED ORDER — TRAMADOL-ACETAMINOPHEN 37.5-325 MG PO TABS: ORAL_TABLET | ORAL | 0 refills | Status: DC

## 2017-08-23 MED ORDER — TOPIRAMATE 200 MG PO TABS: 200.0000 mg | ORAL_TABLET | Freq: Every day | ORAL | 0 refills | Status: DC

## 2017-08-23 MED FILL — TRAMADOL-ACETAMINOPHN 37.5-: 37.5-325 | 4 days supply | Qty: 25 | Fill #0

## 2017-08-23 NOTE — Telephone Encounter (Signed)
It looks like you have prescribed Ultracet PRN and Topamax 200 mg hs. Patient last seen on 11/26/16 and told to return in 4 months. No appt scheduled. Dr. Everlena CooperJaffe - please advise on refills.

## 2017-08-23 NOTE — Telephone Encounter (Signed)
If patient is at the pharmacy, call can be transferred to refill team.  1.     Which medications need to be refilled? (please list name of each medication and dose if know) Patient is needing both her Migraine medications refilled  2.     Which pharmacy/location (including street and city if local pharmacy) is medication to be sent to? [facility]  3.     Do they need a 30 or 90 day supply? 90 Day Supply/ due to losing her job

## 2017-08-23 NOTE — Telephone Encounter (Signed)
We can refill it but will need to make a follow up appointment for further refills.

## 2017-08-23 NOTE — Telephone Encounter (Signed)
RX ordered with note that states she will need appointment for any further refills.   Dr. Everlena CooperJaffe- please sign since controlled substance.

## 2017-09-05 ENCOUNTER — Telehealth: Payer: Self-pay | Admitting: *Deleted

## 2017-09-05 NOTE — Telephone Encounter (Signed)
Called and spoke with Pt, advised her a refill for #90 was sent in on 08/23/17. Pt said she rcvd the ultracet, but did not get the topiramate. Pt states she will call [facility] in the morning. I advised her to call and leave me a message and I will call them if need be.

## 2017-09-05 NOTE — Telephone Encounter (Signed)
Patient called asked in her other prescription can be called in before she loses her job. Prescription is topiramate

## 2017-09-06 MED FILL — TOPIRAMATE 200 MG TABLET: 200 | 30 days supply | Qty: 30 | Fill #0

## 2017-10-11 MED FILL — TOPIRAMATE 200 MG TABLET: 200 | 30 days supply | Qty: 30 | Fill #1

## 2017-10-16 ENCOUNTER — Encounter: Payer: 59 | Admitting: *Deleted

## 2017-10-17 ENCOUNTER — Telehealth: Payer: Self-pay

## 2017-10-17 NOTE — Telephone Encounter (Signed)
LMOVM reminding pt to send remote transmission.   

## 2017-10-22 ENCOUNTER — Encounter: Payer: Self-pay | Admitting: Cardiology

## 2017-10-23 ENCOUNTER — Ambulatory Visit (INDEPENDENT_AMBULATORY_CARE_PROVIDER_SITE_OTHER): Payer: 59 | Admitting: *Deleted

## 2017-10-23 DIAGNOSIS — I442 Atrioventricular block, complete: Secondary | ICD-10-CM | POA: Diagnosis not present

## 2017-10-24 ENCOUNTER — Encounter: Payer: Self-pay | Admitting: Cardiology

## 2017-10-24 NOTE — Progress Notes (Signed)
Remote pacemaker transmission.   

## 2017-11-15 ENCOUNTER — Other Ambulatory Visit: Payer: Self-pay | Admitting: Neurology

## 2017-11-17 LAB — CUP PACEART REMOTE DEVICE CHECK
Battery Remaining Longevity: 75 mo
Battery Voltage: 2.79 V
Brady Statistic AS VP Percent: 93 %
Implantable Lead Implant Date: 20140219
Implantable Lead Location: 753859
Implantable Lead Model: 5076
Implantable Pulse Generator Implant Date: 20140219
Lead Channel Pacing Threshold Amplitude: 0.5 V
Lead Channel Pacing Threshold Pulse Width: 0.4 ms
Lead Channel Setting Pacing Amplitude: 2 V
Lead Channel Setting Pacing Pulse Width: 0.46 ms
Lead Channel Setting Sensing Sensitivity: 2.8 mV
MDC IDC LEAD IMPLANT DT: 20140219
MDC IDC LEAD LOCATION: 753860
MDC IDC MSMT BATTERY IMPEDANCE: 594 Ohm
MDC IDC MSMT LEADCHNL RA IMPEDANCE VALUE: 528 Ohm
MDC IDC MSMT LEADCHNL RA PACING THRESHOLD PULSEWIDTH: 0.4 ms
MDC IDC MSMT LEADCHNL RV IMPEDANCE VALUE: 553 Ohm
MDC IDC MSMT LEADCHNL RV PACING THRESHOLD AMPLITUDE: 1.25 V
MDC IDC SESS DTM: 20190815011908
MDC IDC SET LEADCHNL RV PACING AMPLITUDE: 2.5 V
MDC IDC STAT BRADY AP VP PERCENT: 7 %
MDC IDC STAT BRADY AP VS PERCENT: 0 %
MDC IDC STAT BRADY AS VS PERCENT: 0 %

## 2017-11-18 MED FILL — TRAMADOL-ACETAMINOPHN 37.5-: 37.5-325 | 4 days supply | Qty: 25 | Fill #0

## 2017-11-19 MED FILL — TOPIRAMATE 200 MG TABLET: 200 | 30 days supply | Qty: 30 | Fill #2

## 2017-12-16 ENCOUNTER — Other Ambulatory Visit: Payer: Self-pay | Admitting: Neurology

## 2017-12-16 MED FILL — NORETHIN-ESTRAD-FERR 1-0.02: 1-20 | 84 days supply | Qty: 84 | Fill #0

## 2017-12-18 ENCOUNTER — Other Ambulatory Visit: Payer: Self-pay | Admitting: Neurology

## 2017-12-18 NOTE — Telephone Encounter (Signed)
Not seen in over a year. Denied per Dr. Everlena Cooper.

## 2017-12-18 NOTE — Telephone Encounter (Signed)
Please advise on refill.

## 2017-12-20 ENCOUNTER — Other Ambulatory Visit: Payer: Self-pay | Admitting: Neurology

## 2017-12-20 MED ORDER — TRAMADOL-ACETAMINOPHEN 37.5-325 MG PO TABS: ORAL_TABLET | ORAL | 0 refills | Status: DC

## 2017-12-20 MED ORDER — TOPIRAMATE 200 MG PO TABS: 200.0000 mg | ORAL_TABLET | Freq: Every day | ORAL | 0 refills | Status: DC

## 2017-12-20 MED FILL — TOPIRAMATE 200 MG TABLET: 200 | 60 days supply | Qty: 60 | Fill #0

## 2017-12-20 NOTE — Telephone Encounter (Signed)
Patient made an appt for 01/27/18 and stated that she needs a Refill on the Tramadol and the Topamax medications until then. Please send this to the [facility]. Thanks!

## 2017-12-20 NOTE — Telephone Encounter (Signed)
traMADol-acetaminophen (ULTRACET) 37.5-325 MG tablet #25 with no refills Sig = TAKE 1-2 TABLETS BY MOUTH EVERY 6 HOURS AS NEEDED FOR SEVERE MIGRAINE  Sent to Dr. Everlena Cooper for approval  topiramate (TOPAMAX) 200 MG tablet #90 with no refills Sig = TAKE 1 TABLET (200 MG TOTAL) BY MOUTH AT BEDTIME  Sent to [facility].

## 2017-12-26 ENCOUNTER — Telehealth: Payer: Self-pay | Admitting: Neurology

## 2017-12-26 MED FILL — TRAMADOL-ACETAMINOPHN 37.5-: 37.5-325 | 4 days supply | Qty: 25 | Fill #0

## 2017-12-26 NOTE — Telephone Encounter (Signed)
Called [facility], spoke with [place]. The Rx from 10/11 was never rcvd. He took verbal for #25, same directions with 1 additional refill

## 2017-12-26 NOTE — Telephone Encounter (Signed)
Patient states that she needs her tramadol called into the [facility] [place]

## 2018-01-02 MED FILL — predniSONE 10 MG TABS: 10 | 6 days supply | Qty: 21 | Fill #0

## 2018-01-02 MED FILL — VENTOLIN HFA 90 MCG INHALER: 108 (90 BAS | 17 days supply | Qty: 18 | Fill #0

## 2018-01-02 MED FILL — CEFDINIR 300 MG CAPS: 300 | 7 days supply | Qty: 14 | Fill #0

## 2018-01-02 MED FILL — GUAIATUSSIN AC LIQUID: 100-10 | 4 days supply | Qty: 200 | Fill #0

## 2018-01-22 ENCOUNTER — Telehealth: Payer: Self-pay | Admitting: Cardiology

## 2018-01-22 ENCOUNTER — Ambulatory Visit (INDEPENDENT_AMBULATORY_CARE_PROVIDER_SITE_OTHER): Payer: 59 | Admitting: *Deleted

## 2018-01-22 DIAGNOSIS — I471 Supraventricular tachycardia: Secondary | ICD-10-CM

## 2018-01-22 DIAGNOSIS — I442 Atrioventricular block, complete: Secondary | ICD-10-CM | POA: Diagnosis not present

## 2018-01-22 NOTE — Telephone Encounter (Signed)
Spoke with pt and reminded pt of remote transmission that is due today. Pt verbalized understanding.   

## 2018-01-23 ENCOUNTER — Encounter: Payer: Self-pay | Admitting: Cardiology

## 2018-01-24 NOTE — Progress Notes (Signed)
Remote pacemaker transmission.   

## 2018-01-27 ENCOUNTER — Encounter: Payer: Self-pay | Admitting: Neurology

## 2018-01-27 ENCOUNTER — Ambulatory Visit (INDEPENDENT_AMBULATORY_CARE_PROVIDER_SITE_OTHER): Payer: 59 | Admitting: Neurology

## 2018-01-27 VITALS — BP 100/70 | HR 66 | Ht 64.0 in | Wt 165.0 lb

## 2018-01-27 DIAGNOSIS — M5412 Radiculopathy, cervical region: Secondary | ICD-10-CM

## 2018-01-27 DIAGNOSIS — G43019 Migraine without aura, intractable, without status migrainosus: Secondary | ICD-10-CM

## 2018-01-27 MED ORDER — TOPIRAMATE 200 MG PO TABS: 200.0000 mg | ORAL_TABLET | Freq: Every day | ORAL | 5 refills | Status: DC

## 2018-01-27 MED ORDER — TRAMADOL-ACETAMINOPHEN 37.5-325 MG PO TABS: ORAL_TABLET | ORAL | 0 refills | Status: DC

## 2018-01-27 MED FILL — TRAMADOL-ACETAMINOPHN 37.5-: 37.5-325 | 4 days supply | Qty: 25 | Fill #0

## 2018-01-27 NOTE — Progress Notes (Signed)
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  UPDATE: Intensity:  severe Duration:  3 days Frequency:  1 time (3 days) in past 30 days. Frequency of abortive medication: 3 days. Current NSAIDS: None.  Contraindicated due to ulcerative colitis. Current analgesics: Ultracet Current triptans: None Current ergotamine: None Current anti-emetic: None Current muscle relaxants: None Current anti-anxiolytic: None Current sleep aide:None Current Antihypertensive medications: None Current Antidepressant medications: None Current Anticonvulsant medications: Topiramate 200 mg at bedtime Current anti-CGRP: None Current Vitamins/Herbal/Supplements: iron supplement Current Antihistamines/Decongestants: None Other therapy: None Hormone/birth control: Junel Fe  Caffeine: None Diet: Hydrates with water.  Now gluten-free, which has helped her ulcerative colitis symptoms and overall well-being. Exercise: Increased Depression: no Anxiety: no Other pain:  no Sleep hygiene:  Good.  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  She had an NCV/EMG of the upper extremities on 01/17/2017 which demonstrated mild left chronic C7 radiculopathy as well as right median neuropathy at or distal to the wrist.  She was referred to pain specialist.  She was prescribed gabapentin but it was ineffective.  Symptoms improved.  She notes numbness and pain in left index finger.    HISTORY:  Onset:  Many years ago.  They resolved but returned about March 2017.Marland Kitchen.  She has had increased depression and anxiety since experiencing cardiac arrest 4 years ago and was diagnosed with heart block.  She also was diagnosed with ulcerative colitis and has been unable to work since August. Location:  Bi-frontal, back of neck Quality:  pounding Initial intensity:  Constant 6/10 with episodes of 10/10 Aura:  no Prodrome:  no Associated symptoms: Nausea, vomiting, photophobia, phonophobia, vertigo, weakness, paresthesias Initial duration:  Constant (severe 10/10 fluctuations last 24 to 48 hours) Initial Frequency:  Daily (severe 10/10 fluctuations occur every 10 days) Triggers/exacerbating factors:  noise, light, movement Relieving factors:  Rest in quiet dark room with cool rag Activity:  Aggravated by light activity.  Cannot function at all when severe.  Past NSAIDS: Ibuprofen, naproxen Past analgesics: Midrin, Tylenol, Percocet, Fioricet, BC powder Past abortive triptans: Maxalt (cause slurred speech/difficulty thinking) Past abortive ergotamine:  none Past muscle relaxants:  none Past anti-emetic:  none Past antihypertensive medications:  none Past antidepressant medications: Nortriptyline Past anticonvulsant medications:  none Past anti-CGRP:  none Past vitamins/Herbal/Supplements:  none Past antihistamines/decongestants:  none Other past therapies:  none  Family history of headache:  father CT of head from 03/23/14 was normal.  In 2015, she developed Shingles on the left arm.  In June, she developed a sharp burning pain along the same distribution of her Shingles, the left side of her neck down the lateral arm to the entire hand.  After a few days, the pain subsided except for the hand and now she only notes constant pins and needles sensation in the index and lateral aspect of her middle finger.  She did not develop a rash.  She has some neck discomfort.  She denies weakness.  She tried  Lyrica, which was too strong.  She would rather not take a pain medication due to potential  drowsiness.  PAST MEDICAL HISTORY: Past Medical History:  Diagnosis Date  . Abnormal Pap smear 2004   colpo  . Alcohol abuse   . Anxiety   . 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 Candida vaginitis 05/2006  . 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 Depression   . H/O candidiasis   . H/O varicella 2005  . IBS (irritable bowel syndrome)   . Panic attack   . Pelvic pain in female 2007    MEDICATIONS: Current Outpatient Medications on File Prior to Visit  Medication Sig Dispense Refill  . ALPRAZolam (XANAX) 1 MG tablet Take 1 mg by mouth 2 (two) times daily as needed.  3  . Ferrous Sulfate (IRON SUPPLEMENT PO) Take by mouth.    Colleen Can FE 1/20 1-20 MG-MCG tablet Take 1 tablet by mouth daily.    Marland Kitchen topiramate (TOPAMAX) 200 MG tablet Take 1 tablet (200 mg total) by mouth at bedtime. 60 tablet 0  . traMADol-acetaminophen (ULTRACET) 37.5-325 MG tablet TAKE 1-2 TABLETS BY MOUTH EVERY 6 HOURS AS NEEDED FOR SEVERE MIGRAINE 25 tablet 0   No current facility-administered medications on file prior to visit.     ALLERGIES: No Known Allergies  FAMILY HISTORY: Family History  Problem Relation Age of Onset  . Diabetes Mother   . Cancer Sister        Lump removed frfom shoulder  . Heart disease Maternal Grandfather        MI  . Cancer Paternal Grandmother        breast   SOCIAL HISTORY: Social History   Socioeconomic History  . Marital status: Married    Spouse name: Not on file  . Number of children: Not on file  . Years of education: Not on file  . Highest education level: Not on file  Occupational History  . Not on file  Social Needs  . Financial resource strain: Not on file  . Food insecurity:    Worry: Not on file    Inability: Not on file  . Transportation needs:    Medical: Not on file     Non-medical: Not on file  Tobacco Use  . Smoking status: Former Smoker    Packs/day: 0.50    Types: Cigarettes  . Smokeless tobacco: Never Used  Substance and Sexual Activity  . Alcohol use: No    Comment: less then social  . Drug use: No  . Sexual activity: Yes    Partners: Male    Birth control/protection: Pill    Comment: loestrin fe  Lifestyle  . Physical activity:    Days per week: Not on file    Minutes per session: Not on file  . Stress: Not on file  Relationships  . Social connections:    Talks on phone: Not on file    Gets together: Not on file    Attends religious service: Not on file    Active member of club or organization: Not on file    Attends meetings of clubs or organizations: Not on file    Relationship status: Not on file  . Intimate partner violence:    Fear of current or ex partner: Not on file    Emotionally abused: Not on file    Physically abused: Not on file    Forced sexual activity: Not on file  Other Topics Concern  . Not on file  Social History  Narrative   Lives in [place] with spouse and son age 45.   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    REVIEW OF SYSTEMS: Constitutional: No fevers, chills, or sweats, no generalized fatigue, change in appetite Eyes: No visual changes, double vision, eye pain Ear, nose and throat: No hearing loss, ear pain, nasal congestion, sore throat Cardiovascular: No chest pain, palpitations Respiratory:  No shortness of breath at rest or with exertion, wheezes GastrointestinaI: No nausea, vomiting, diarrhea, abdominal pain, fecal incontinence Genitourinary:  No dysuria, urinary retention or frequency Musculoskeletal:  No neck pain, back pain Integumentary: No rash, pruritus, skin lesions Neurological: as above Psychiatric: No depression, insomnia, anxiety Endocrine: No palpitations, fatigue, diaphoresis, mood swings, change in appetite, change in weight, increased thirst Hematologic/Lymphatic:  No  purpura, petechiae. Allergic/Immunologic: no itchy/runny eyes, nasal congestion, recent allergic reactions, rashes  PHYSICAL EXAM: Blood pressure 100/70, pulse 66, height 5\' 4"  (1.626 m), weight 165 lb (74.8 kg), SpO2 98 %, unknown if currently breastfeeding. General: No acute distress.  Patient appears well-groomed.   Head:  Normocephalic/atraumatic Eyes:  Fundi examined but not visualized Neck: supple, no paraspinal tenderness, full range of motion Heart:  Regular rate and rhythm Lungs:  Clear to auscultation bilaterally Back: No paraspinal tenderness Neurological Exam: alert and oriented to person, place, and time. Attention span and concentration intact, recent and remote memory intact, fund of knowledge intact.  Speech fluent and not dysarthric, language intact.  CN II-XII intact. Bulk and tone normal, muscle strength 5/5 throughout.  Sensation to light touch, temperature and vibration intact.  Deep tendon reflexes 2+ throughout, toes downgoing.  Finger to nose and heel to shin testing intact.  Gait normal, Romberg negative.  IMPRESSION: 1.Migraine, without status migrainosus, intractable 2.  Left cervical radiculitis, improved.  Defers pain management at this time.  PLAN: 1.  Continue topiramate 200 mg at bedtime, refilled 2.  Continue Ultracet as needed, refilled 3.  Limit use of pain relievers to no more than 2 days out of week to prevent risk of rebound or medication-overuse headache. 4.  Keep headache diary 5.  Follow up in 6 months.  [name], DO  CC: Roxanne Mins, PA-C

## 2018-01-27 NOTE — Patient Instructions (Addendum)
1.  Refilled topiramate and tramadol-acetaminophen 2.  Once you get settled at work, contact me and we can increase topiramate 3.  Follow up in 6 months.

## 2018-02-27 MED FILL — TRAMADOL-ACETAMINOPHN 37.5-: 37.5-325 | 4 days supply | Qty: 25 | Fill #1

## 2018-03-21 MED FILL — TOPIRAMATE 200 MG TABLET: 200 | 30 days supply | Qty: 30 | Fill #0

## 2018-03-23 LAB — CUP PACEART REMOTE DEVICE CHECK
Battery Impedance: 645 Ohm
Battery Remaining Longevity: 72 mo
Battery Voltage: 2.79 V
Brady Statistic AP VS Percent: 0 %
Date Time Interrogation Session: 20191115025407
Implantable Lead Implant Date: 20140219
Implantable Lead Implant Date: 20140219
Implantable Lead Location: 753860
Implantable Lead Model: 5076
Implantable Lead Model: 5076
Lead Channel Impedance Value: 546 Ohm
Lead Channel Pacing Threshold Amplitude: 0.5 V
Lead Channel Pacing Threshold Pulse Width: 0.4 ms
Lead Channel Pacing Threshold Pulse Width: 0.4 ms
Lead Channel Setting Pacing Amplitude: 2 V
Lead Channel Setting Pacing Amplitude: 2.5 V
Lead Channel Setting Pacing Pulse Width: 0.46 ms
MDC IDC LEAD LOCATION: 753859
MDC IDC MSMT LEADCHNL RA IMPEDANCE VALUE: 520 Ohm
MDC IDC MSMT LEADCHNL RV PACING THRESHOLD AMPLITUDE: 1 V
MDC IDC PG IMPLANT DT: 20140219
MDC IDC SET LEADCHNL RV SENSING SENSITIVITY: 2.8 mV
MDC IDC STAT BRADY AP VP PERCENT: 6 %
MDC IDC STAT BRADY AS VP PERCENT: 94 %
MDC IDC STAT BRADY AS VS PERCENT: 0 %

## 2018-04-11 ENCOUNTER — Telehealth: Payer: Self-pay | Admitting: Neurology

## 2018-04-11 MED ORDER — TRAMADOL-ACETAMINOPHEN 37.5-325 MG PO TABS: 1.0000 | ORAL_TABLET | Freq: Four times a day (QID) | ORAL | 2 refills | Status: DC | PRN

## 2018-04-11 MED FILL — TRAMADOL-ACETAMINOPHN 37.5-: 37.5-325 | 3 days supply | Qty: 25 | Fill #0

## 2018-04-11 NOTE — Telephone Encounter (Signed)
She needs a refill on the Tramadol- acetamminophen sent to the  [facility] [place]. She would like to have refills so she does not have to call everyone month to get a new rx

## 2018-04-11 NOTE — Telephone Encounter (Signed)
Per Dr. Everlena Cooper, OK to send refill with 2 additional, am faxing to [facility]

## 2018-04-23 ENCOUNTER — Ambulatory Visit: Payer: 59

## 2018-05-02 MED FILL — TOPIRAMATE 200 MG TABLET: 200 | 30 days supply | Qty: 30 | Fill #1

## 2018-05-07 ENCOUNTER — Ambulatory Visit (INDEPENDENT_AMBULATORY_CARE_PROVIDER_SITE_OTHER): Payer: 59 | Admitting: *Deleted

## 2018-05-07 DIAGNOSIS — I442 Atrioventricular block, complete: Secondary | ICD-10-CM | POA: Diagnosis not present

## 2018-05-09 LAB — CUP PACEART REMOTE DEVICE CHECK
Battery Impedance: 745 Ohm
Battery Remaining Longevity: 66 mo
Battery Voltage: 2.79 V
Brady Statistic AP VP Percent: 6 %
Brady Statistic AP VS Percent: 0 %
Brady Statistic AS VS Percent: 0 %
Date Time Interrogation Session: 20200227021845
Implantable Lead Implant Date: 20140219
Implantable Lead Implant Date: 20140219
Implantable Lead Location: 753860
Implantable Lead Model: 5076
Implantable Lead Model: 5076
Lead Channel Impedance Value: 528 Ohm
Lead Channel Impedance Value: 530 Ohm
Lead Channel Pacing Threshold Amplitude: 0.5 V
Lead Channel Pacing Threshold Pulse Width: 0.4 ms
Lead Channel Setting Pacing Amplitude: 2.5 V
MDC IDC LEAD LOCATION: 753859
MDC IDC MSMT LEADCHNL RV PACING THRESHOLD AMPLITUDE: 1.125 V
MDC IDC MSMT LEADCHNL RV PACING THRESHOLD PULSEWIDTH: 0.4 ms
MDC IDC PG IMPLANT DT: 20140219
MDC IDC SET LEADCHNL RA PACING AMPLITUDE: 2 V
MDC IDC SET LEADCHNL RV PACING PULSEWIDTH: 0.46 ms
MDC IDC SET LEADCHNL RV SENSING SENSITIVITY: 2.8 mV
MDC IDC STAT BRADY AS VP PERCENT: 94 %

## 2018-05-14 MED FILL — TRAMADOL-ACETAMINOPHN 37.5-: 37.5-325 | 3 days supply | Qty: 25 | Fill #1

## 2018-05-14 NOTE — Progress Notes (Signed)
Remote pacemaker transmission.   

## 2018-06-04 MED FILL — BLISOVI FE 1/20 1-20 MG-MCG: 1-20 | 84 days supply | Qty: 84 | Fill #1

## 2018-06-18 MED FILL — TOPIRAMATE 200 MG TABLET: 200 | 30 days supply | Qty: 30 | Fill #2

## 2018-06-18 MED FILL — TRAMADOL-ACETAMINOPHN 37.5-: 37.5-325 | 3 days supply | Qty: 25 | Fill #2

## 2018-07-18 ENCOUNTER — Other Ambulatory Visit: Payer: Self-pay | Admitting: Neurology

## 2018-07-21 MED FILL — TRAMADOL-ACETAMINOPHN 37.5-: 37.5-325 | 4 days supply | Qty: 25 | Fill #0

## 2018-07-25 MED FILL — TOPIRAMATE 200 MG TABLET: 200 | 30 days supply | Qty: 30 | Fill #3

## 2018-07-28 ENCOUNTER — Ambulatory Visit: Payer: Self-pay | Admitting: Neurology

## 2018-08-07 ENCOUNTER — Encounter: Payer: 59 | Admitting: *Deleted

## 2018-08-08 ENCOUNTER — Telehealth: Payer: Self-pay

## 2018-08-08 NOTE — Telephone Encounter (Signed)
Left message for patient to remind of missed remote transmission.  

## 2018-08-14 ENCOUNTER — Encounter: Payer: Self-pay | Admitting: Cardiology

## 2018-08-15 ENCOUNTER — Ambulatory Visit (INDEPENDENT_AMBULATORY_CARE_PROVIDER_SITE_OTHER): Payer: 59 | Admitting: *Deleted

## 2018-08-15 DIAGNOSIS — I442 Atrioventricular block, complete: Secondary | ICD-10-CM

## 2018-08-15 LAB — CUP PACEART REMOTE DEVICE CHECK
Battery Impedance: 847 Ohm
Battery Remaining Longevity: 63 mo
Battery Voltage: 2.78 V
Brady Statistic AP VP Percent: 5 %
Brady Statistic AP VS Percent: 0 %
Brady Statistic AS VP Percent: 95 %
Brady Statistic AS VS Percent: 0 %
Date Time Interrogation Session: 20200605031916
Implantable Lead Implant Date: 20140219
Implantable Lead Implant Date: 20140219
Implantable Lead Location: 753859
Implantable Lead Location: 753860
Implantable Lead Model: 5076
Implantable Lead Model: 5076
Implantable Pulse Generator Implant Date: 20140219
Lead Channel Impedance Value: 536 Ohm
Lead Channel Impedance Value: 571 Ohm
Lead Channel Pacing Threshold Amplitude: 0.5 V
Lead Channel Pacing Threshold Amplitude: 1 V
Lead Channel Pacing Threshold Pulse Width: 0.4 ms
Lead Channel Pacing Threshold Pulse Width: 0.4 ms
Lead Channel Setting Pacing Amplitude: 2 V
Lead Channel Setting Pacing Amplitude: 2.5 V
Lead Channel Setting Pacing Pulse Width: 0.46 ms
Lead Channel Setting Sensing Sensitivity: 2.8 mV

## 2018-08-22 ENCOUNTER — Encounter: Payer: Self-pay | Admitting: Cardiology

## 2018-08-22 MED FILL — BLISOVI FE 1/20 1-20 MG-MCG: 1-20 | 84 days supply | Qty: 84 | Fill #2

## 2018-08-22 NOTE — Progress Notes (Signed)
Remote pacemaker transmission.   

## 2018-08-25 MED FILL — TOPIRAMATE 200 MG TABLET: 200 | 30 days supply | Qty: 30 | Fill #4

## 2018-08-25 MED FILL — TRAMADOL-ACETAMINOPHN 37.5-: 37.5-325 | 4 days supply | Qty: 25 | Fill #1

## 2018-09-24 MED FILL — TRAMADOL-ACETAMINOPHN 37.5-: 37.5-325 | 4 days supply | Qty: 25 | Fill #2

## 2018-09-26 MED FILL — TOPIRAMATE 200 MG TABLET: 200 | 30 days supply | Qty: 30 | Fill #5

## 2018-10-29 ENCOUNTER — Other Ambulatory Visit: Payer: Self-pay | Admitting: Neurology

## 2018-10-29 MED FILL — BLISOVI FE 1/20 1-20 MG-MCG: 1-20 | 84 days supply | Qty: 84 | Fill #3

## 2018-10-30 NOTE — Progress Notes (Signed)
Virtual Visit via Video Note The purpose of this virtual visit is to provide medical care while limiting exposure to the novel coronavirus.    Consent was obtained for video visit:  Yes Answered questions that patient had about telehealth interaction:  Yes I discussed the limitations, risks, security and privacy concerns of performing an evaluation and management service by telemedicine. I also discussed with the patient that there may be a patient responsible charge related to this service. The patient expressed understanding and agreed to proceed.  Pt location: Home Physician Location: office Name of referring provider:  Coralee RudDuran, Michael R, PA-C I connected with [name] NiemannKatherine H [name] at patients initiation/request on 10/31/2018 at  9:50 AM EDT by video enabled telemedicine application and verified that I am speaking with the correct person using two identifiers. 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  UPDATE: Working from home since COVID started.   Looks at [employer] all day  Intensity:  Dull to moderate Duration:  1 to 2 days Frequency:  3 days with headache Current NSAIDS: None.  Contraindicated due to ulcerative colitis. Current analgesics: Ultracet Current triptans: None Current ergotamine: None Current anti-emetic: None Current muscle relaxants: None Current anti-anxiolytic: None Current sleep aide:None Current Antihypertensive medications: None Current Antidepressant medications: None Current Anticonvulsant medications: Topiramate 200 mg at bedtime Current anti-CGRP: None Current  Vitamins/Herbal/Supplements: iron supplement Current Antihistamines/Decongestants: None Other therapy: None Hormone/birth control: Junel Fe  Caffeine: None Diet: Hydrates with water.  Now gluten-free, which has helped her ulcerative colitis symptoms and overall well-being. Exercise: Increased Depression: no Anxiety: no Other pain:  no Sleep hygiene:  Good.  HISTORY: Onset:  Early 3420s.  They resolved but returned about March 2017.Marland Kitchen. She has had increased depression and anxiety since experiencing cardiac arrest 4 years ago and was diagnosed with heart block. She also was diagnosed with ulcerative colitis and has been unable to work since August. Location: Bi-frontal, back of neck Quality: pounding Initial intensity: Constant 6/10 with episodes of 10/10 Aura: no Prodrome: no Associated symptoms: Nausea, vomiting, photophobia, phonophobia, vertigo, weakness, paresthesias Initial duration: Constant (severe 10/10 fluctuations last 24 to 48 hours) Initial Frequency: Daily (severe 10/10 fluctuations occur every 10 days) Triggers/aggravating factors:  Noise, light, movement Relieving factors: Rest in quiet dark room with cool rag Activity: Aggravated by light activity. Cannot function at all when severe.  Past NSAIDS: Ibuprofen, naproxen Past analgesics: Midrin, Tylenol, Percocet, Fioricet, BC powder Past abortive triptans: Maxalt (cause slurred speech/difficulty thinking) Past abortive ergotamine:  none Past muscle relaxants:  none Past anti-emetic:  none Past antihypertensive medications:  none Past antidepressant medications: Nortriptyline Past anticonvulsant medications:  none Past anti-CGRP:  none Past vitamins/Herbal/Supplements:  none Past antihistamines/decongestants:  none Other past therapies:  none  Family history of headache: father CT of head from 03/23/14 was normal.  In 2015, she developed Shingles on the left arm. In June 2018, she developed a sharp  burning pain along the same distribution of her Shingles, the left side of her neck down the lateral arm to the entire hand. After a few days, the pain subsided except for the hand and now she only notes constant pins and needles  sensation in the index and lateral aspect of her middle finger. She did not develop a rash. She has some neck discomfort. She denies weakness. She tried Lyrica, which was too strong. She would rather not take a pain medication due to potential drowsiness.  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  She had an NCV/EMG of the upper extremities on 01/17/2017 which demonstrated mild left chronic C7 radiculopathy as well as right median neuropathy at or distal to the wrist.  She was referred to pain specialist.  She was prescribed gabapentin but it was ineffective.  Symptoms improved.  She notes numbness and pain in left index finger.  Past Medical History: Past Medical History:  Diagnosis Date   Abnormal Pap smear 2004   colpo   Alcohol abuse    Anxiety    AVNRT (AV nodal re-entry tachycardia) ([place]) 04/29/12   s/p slow pathway modification   Candida vaginitis 05/2006   Complete heart block, post-surgical ([place]) 04/30/12   s/p PPM implant   Depression    H/O candidiasis    H/O varicella 2005   IBS (irritable bowel syndrome)    Panic attack    Pelvic pain in female 2007    Medications: Outpatient Encounter Medications as of 10/31/2018  Medication Sig Note   ALPRAZolam (XANAX) 1 MG tablet Take 1 mg by mouth 2 (two) times daily as needed. 03/16/2016: Received from: External Pharmacy   Ferrous Sulfate (IRON SUPPLEMENT PO) Take by mouth.    JUNEL FE 1/20 1-20 MG-MCG tablet Take 1 tablet by mouth daily.    topiramate (TOPAMAX) 200 MG tablet Take 1 tablet (200 mg total) by mouth at bedtime.     traMADol-acetaminophen (ULTRACET) 37.5-325 MG tablet TAKE 1-2 TABLETS BY MOUTH EVERY 6 HOURS AS NEEDED FOR SEVERE MIGRAINE    traMADol-acetaminophen (ULTRACET) 37.5-325 MG tablet Take 1-2 tablets by mouth every 6 (six) hours as needed. For severe migraine    traMADol-acetaminophen (ULTRACET) 37.5-325 MG tablet TAKE 1-2 TABLETS BY MOUTH EVERY 6 HOURS AS NEEDED FOR SEVERE MIGRAINE    No facility-administered encounter medications on file as of 10/31/2018.     Allergies: No Known Allergies  Family History: Family History  Problem Relation Age of Onset   Diabetes Mother    Cancer Sister        Lump removed frfom shoulder   Heart disease Maternal Grandfather        MI   Cancer Paternal Grandmother        breast    Social History: Social History   Socioeconomic History   Marital status: Married    Spouse name: Not on file   Number of children: Not on file   Years of education: Not on file   Highest education level: Not on file  Occupational History   Not on file  Social Needs   Financial resource strain: Not on file   Food insecurity    Worry: Not on file    Inability: Not on file   Transportation needs    Medical: Not on file    Non-medical: Not on file  Tobacco Use   Smoking status: Former Smoker    Packs/day: 0.50    Types: Cigarettes   Smokeless tobacco: Never Used  Substance and Sexual Activity   Alcohol use: No    Comment: less then social   Drug use: No  Sexual activity: Yes    Partners: Male    Birth control/protection: Pill    Comment: loestrin fe  Lifestyle   Physical activity    Days per week: Not on file    Minutes per session: Not on file   Stress: Not on file  Relationships   Social connections    Talks on phone: Not on file    Gets together: Not on file    Attends religious service: Not on file    Active member of club or organization: Not on file    Attends meetings of clubs or organizations: Not on file    Relationship  status: Not on file   Intimate partner violence    Fear of current or ex partner: Not on file    Emotionally abused: Not on file    Physically abused: Not on file    Forced sexual activity: Not on file  Other Topics Concern   Not on file  Social History Narrative   Lives in [place] with spouse and son age 278.   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 No acute distress.  Alert and oriented.  Speech fluent and not dysarthric.  Language intact.  Eyes orthophoric on primary gaze.  Face symmetric.  Assessment and Plan:   1.  Migraine without aura, without status migrainosus, not intractable 2.  Left cervical radiculitis, stable  1.  For preventative management, will start Aimovig 70mg  monthly in addition to topiramate 200mg  at bedtime 2.  For abortive therapy, will try Ubrelvy.  Will refill Ultracet in case it is ineffective. 3.  Limit use of pain relievers to no more than 2 days out of week to prevent risk of rebound or medication-overuse headache. 4.  Keep headache diary 5.  Exercise, hydration, caffeine cessation, sleep hygiene, monitor for and avoid triggers 6.  Consider:  magnesium citrate 400mg  daily, riboflavin 400mg  daily, and coenzyme Q10 100mg  three times daily 7. Always keep in mind that currently taking a hormone or birth control may be a possible trigger or aggravating factor for migraine. 8. Follow up 6 months   Follow Up Instructions:    -I discussed the assessment and treatment plan with the patient. The patient was provided an opportunity to ask questions and all were answered. The patient agreed with the plan and demonstrated an understanding of the instructions.   The patient was advised to call back or seek an in-person evaluation if the symptoms worsen or if the condition fails to improve as anticipated.      Cira ServantAdam Robert Maclean Foister, DO

## 2018-10-31 ENCOUNTER — Telehealth (INDEPENDENT_AMBULATORY_CARE_PROVIDER_SITE_OTHER): Payer: 59 | Admitting: Neurology

## 2018-10-31 ENCOUNTER — Other Ambulatory Visit: Payer: Self-pay

## 2018-10-31 ENCOUNTER — Encounter: Payer: Self-pay | Admitting: Neurology

## 2018-10-31 VITALS — Ht 64.0 in | Wt 155.0 lb

## 2018-10-31 DIAGNOSIS — G43009 Migraine without aura, not intractable, without status migrainosus: Secondary | ICD-10-CM | POA: Diagnosis not present

## 2018-10-31 MED ORDER — AIMOVIG 70 MG/ML ~~LOC~~ SOAJ: 70.0000 mg | SUBCUTANEOUS | 11 refills | Status: DC

## 2018-10-31 MED ORDER — TRAMADOL-ACETAMINOPHEN 37.5-325 MG PO TABS: ORAL_TABLET | ORAL | 2 refills | Status: DC

## 2018-10-31 MED FILL — TRAMADOL-ACETAMINOPHN 37.5-: 37.5-325 | 4 days supply | Qty: 25 | Fill #0

## 2018-10-31 NOTE — Patient Instructions (Signed)
1.  In addition to topiramate, start Aimovig 70mg  monthly. 2.  When you get a migraine, take Ubrelvy 100mg .  May rpeeat 100mg  after 2 hours if needed (maximum 200mg  in 24 hours).  If ineffective, I refilled the tramadol 3.  Limit use of pain relievers to no more than 2 days out of week to prevent risk of rebound or medication-overuse headache. 4.  Keep headache diary 5.  Follow up in 6 months.

## 2018-11-03 ENCOUNTER — Other Ambulatory Visit: Payer: Self-pay | Admitting: Neurology

## 2018-11-03 ENCOUNTER — Other Ambulatory Visit: Payer: Self-pay | Admitting: *Deleted

## 2018-11-03 DIAGNOSIS — G43009 Migraine without aura, not intractable, without status migrainosus: Secondary | ICD-10-CM

## 2018-11-03 MED ORDER — UBRELVY 100 MG PO TABS: 100.0000 mg | ORAL_TABLET | ORAL | 0 refills | Status: DC | PRN

## 2018-11-03 MED ORDER — UBRELVY 50 MG PO TABS: 50.0000 mg | ORAL_TABLET | ORAL | 0 refills | Status: DC | PRN

## 2018-11-03 MED FILL — TOPIRAMATE 200 MG TABLET: 200 | 60 days supply | Qty: 60 | Fill #0

## 2018-11-14 ENCOUNTER — Encounter: Payer: 59 | Admitting: *Deleted

## 2018-11-26 ENCOUNTER — Encounter: Payer: Self-pay | Admitting: Cardiology

## 2018-12-01 MED FILL — TRAMADOL-ACETAMINOPHN 37.5-: 37.5-325 | 4 days supply | Qty: 25 | Fill #1

## 2018-12-22 MED FILL — TOPIRAMATE 200 MG TABLET: 200 | 60 days supply | Qty: 60 | Fill #0

## 2018-12-29 MED FILL — TRAMADOL-ACETAMINOPHN 37.5-: 37.5-325 | 4 days supply | Qty: 25 | Fill #2

## 2019-01-27 ENCOUNTER — Other Ambulatory Visit: Payer: Self-pay | Admitting: Neurology

## 2019-01-27 MED FILL — TRAMADOL-ACETAMINOPHN 37.5-: 37.5-325 | 4 days supply | Qty: 25 | Fill #0

## 2019-01-27 MED FILL — BLISOVI FE 1/20 1-20 MG-MCG: 1-20 | 84 days supply | Qty: 84 | Fill #0

## 2019-02-23 ENCOUNTER — Other Ambulatory Visit: Payer: Self-pay | Admitting: Neurology

## 2019-02-24 MED FILL — TOPIRAMATE 200 MG TABLET: 200 | 60 days supply | Qty: 60 | Fill #0

## 2019-02-24 NOTE — Telephone Encounter (Signed)
Requested Prescriptions   Pending Prescriptions Disp Refills  . topiramate (TOPAMAX) 200 MG tablet [Pharmacy Med Name: TOPIRAMATE 200 MG TABLET 200 TAB] 60 tablet 0    Sig: TAKE 1 TABLET (200 MG TOTAL) BY MOUTH AT BEDTIME.   Rx last filled:11/03/18 #60 0 REFILLS  Pt last seen:10/31/18   Follow up appt scheduled:NONE

## 2019-02-26 MED FILL — TRAMADOL-ACETAMINOPHN 37.5-: 37.5-325 | 4 days supply | Qty: 25 | Fill #1

## 2019-03-30 MED FILL — TRAMADOL-ACETAMINOPHN 37.5-: 37.5-325 | 4 days supply | Qty: 25 | Fill #2

## 2019-04-28 ENCOUNTER — Other Ambulatory Visit: Payer: Self-pay | Admitting: Neurology

## 2019-04-28 MED FILL — TOPIRAMATE 200 MG TABLET: 200 | 30 days supply | Qty: 30 | Fill #0

## 2019-04-28 MED FILL — BLISOVI FE 1/20 1-20 MG-MCG: 1-20 | 28 days supply | Qty: 28 | Fill #0

## 2019-04-28 MED FILL — TRAMADOL-ACETAMINOPHN 37.5-: 37.5-325 | 4 days supply | Qty: 25 | Fill #0

## 2019-05-25 MED FILL — TRAMADOL-ACETAMINOPHN 37.5-: 37.5-325 | 4 days supply | Qty: 25 | Fill #1

## 2019-05-25 MED FILL — BLISOVI FE 1/20 1-20 MG-MCG: 1-20 | 28 days supply | Qty: 28 | Fill #1

## 2019-05-25 MED FILL — TOPIRAMATE 200 MG TABLET: 200 | 30 days supply | Qty: 30 | Fill #1

## 2019-05-26 ENCOUNTER — Ambulatory Visit (INDEPENDENT_AMBULATORY_CARE_PROVIDER_SITE_OTHER): Payer: Managed Care, Other (non HMO) | Admitting: *Deleted

## 2019-05-26 DIAGNOSIS — I442 Atrioventricular block, complete: Secondary | ICD-10-CM

## 2019-05-27 LAB — CUP PACEART REMOTE DEVICE CHECK
Battery Impedance: 1161 Ohm
Battery Remaining Longevity: 53 mo
Battery Voltage: 2.77 V
Brady Statistic AP VP Percent: 5 %
Brady Statistic AP VS Percent: 0 %
Brady Statistic AS VP Percent: 95 %
Brady Statistic AS VS Percent: 0 %
Date Time Interrogation Session: 20210315210121
Implantable Lead Implant Date: 20140219
Implantable Lead Implant Date: 20140219
Implantable Lead Location: 753859
Implantable Lead Location: 753860
Implantable Lead Model: 5076
Implantable Lead Model: 5076
Implantable Pulse Generator Implant Date: 20140219
Lead Channel Impedance Value: 562 Ohm
Lead Channel Impedance Value: 642 Ohm
Lead Channel Pacing Threshold Amplitude: 0.5 V
Lead Channel Pacing Threshold Amplitude: 1.5 V
Lead Channel Pacing Threshold Pulse Width: 0.4 ms
Lead Channel Pacing Threshold Pulse Width: 0.4 ms
Lead Channel Setting Pacing Amplitude: 2 V
Lead Channel Setting Pacing Amplitude: 2.5 V
Lead Channel Setting Pacing Pulse Width: 0.46 ms
Lead Channel Setting Sensing Sensitivity: 2.8 mV

## 2019-05-27 NOTE — Progress Notes (Signed)
PPM Remote  

## 2019-06-01 ENCOUNTER — Telehealth: Payer: Self-pay

## 2019-06-01 NOTE — Telephone Encounter (Signed)
The pt states someone called her and she got a letter about a missed an appointment. Remote transmission we received and billed for. The pt is overdue for an appointment with Dr. Johney Frame. I tried to transfer her to the scheduler but she hanged up the phone. I asked the scheduler [place] to give her a call.

## 2019-06-24 NOTE — Progress Notes (Signed)
Electrophysiology Office Note Date: 06/26/2019  ID:  KALISI BEVILL, DOB 1976-01-24, MRN [id number]  PCP: Secundino Ginger, PA-C Electrophysiologist: Rayann Heman  CC: Pacemaker follow-up  [name] [name] is a 44 y.o. female seen today for Dr Rayann Heman.  He presents today for routine electrophysiology followup.  Since last being seen in our clinic, the patient reports doing very well. She has done well from a mental health standpoint.  Her son is 85 and in the early college at [employer].  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 She has not yet received her COVID vaccine - she is worried about side effects.   She denies chest pain, palpitations, dyspnea, PND, orthopnea, nausea, vomiting, dizziness, syncope, edema, weight gain, or early satiety.  Device History: MDT dual chamber PPM implanted 2014 for complete heart block    Past Medical History:  Diagnosis Date  . Abnormal Pap smear 2004   colpo  . Alcohol abuse   . Anxiety   . AVNRT (AV nodal re-entry tachycardia) ([place]) 04/29/12   s/p slow pathway modification  . Candida vaginitis 05/2006  . Complete heart block, post-surgical ([place]) 04/30/12   s/p PPM implant  . Depression   . H/O candidiasis   . H/O varicella 2005  . IBS (irritable bowel syndrome)   . Panic attack   . Pelvic pain in female 2007   Past Surgical History:  Procedure Laterality Date  . CHOLECYSTECTOMY    . EP study and ablation  04/29/12   slow pathway ablation by Dr Rayann Heman   . PACEMAKER INSERTION  04/30/12   Medtronic Adapta L implanted by Dr Lovena Le for complete heart block  . PERMANENT PACEMAKER INSERTION N/A 04/30/2012   Procedure: PERMANENT PACEMAKER INSERTION;  Surgeon: Evans Lance, MD;  [facility] LAB;  Service: Cardiovascular;  Laterality: N/A;  . SUPRAVENTRICULAR TACHYCARDIA ABLATION N/A 04/29/2012   Procedure: SUPRAVENTRICULAR TACHYCARDIA ABLATION;  Surgeon: Thompson Grayer, MD;  Location: [facility] CATH LAB;  Service: Cardiovascular;  Laterality:  N/A;  . TONSILLECTOMY  1995    Current Outpatient Medications  Medication Sig Dispense Refill  . JUNEL FE 1/20 1-20 MG-MCG tablet Take 1 tablet by mouth daily.    Marland Kitchen topiramate (TOPAMAX) 200 MG tablet TAKE 1 TABLET (200 MG TOTAL) BY MOUTH AT BEDTIME. 60 tablet 2  . traMADol-acetaminophen (ULTRACET) 37.5-325 MG tablet TAKE 1-2 TABLETS BY MOUTH EVERY 6 HOURS AS NEEDED FOR SEVERE MIGRAINE 25 tablet 2   No current facility-administered medications for this visit.    Allergies:   Patient has no known allergies.   Social History: Social History   Socioeconomic History  . Marital status: Married    Spouse name: Not on file  . Number of children: Not on file  . Years of education: Not on file  . Highest education level: Not on file  Occupational History  . Not on file  Tobacco Use  . Smoking status: Former Smoker    Packs/day: 0.50    Types: Cigarettes  . Smokeless tobacco: Never Used  Substance and Sexual Activity  . Alcohol use: No    Comment: less then social  . Drug use: No  . Sexual activity: Yes    Partners: Male    Birth control/protection: Pill    Comment: loestrin fe  Other Topics Concern  . Not on file  Social History Narrative   Lives in [place] with spouse and son age 71.   Tree surgeon for an [employer]  Two story   Right handed   Social Determinants of Health   Financial Resource Strain:   . Difficulty of Paying Living Expenses:   Food Insecurity:   . Worried About Programme researcher, broadcasting/film/video in the Last Year:   . Barista in the Last Year:   Transportation Needs:   . Freight forwarder (Medical):   Marland Kitchen Lack of Transportation (Non-Medical):   Physical Activity:   . Days of Exercise per Week:   . Minutes of Exercise per Session:   Stress:   . Feeling of Stress :   Social Connections:   . Frequency of Communication with Friends and Family:   . Frequency of Social Gatherings with Friends and Family:   . Attends Religious Services:   .  Active Member of Clubs or Organizations:   . Attends Banker Meetings:   Marland Kitchen Marital Status:   Intimate Partner Violence:   . Fear of Current or Ex-Partner:   . Emotionally Abused:   Marland Kitchen Physically Abused:   . Sexually Abused:     Family History: Family History  Problem Relation Age of Onset  . Diabetes Mother   . Cancer Sister        Lump removed frfom shoulder  . Heart disease Maternal Grandfather        MI  . Cancer Paternal Grandmother        breast     Review of Systems: All other systems reviewed and are otherwise negative except as noted above.   Physical Exam: VS:  BP 110/90   Pulse 85   Ht 5\' 4"  (1.626 m)   Wt 168 lb (76.2 kg)   SpO2 100%   BMI 28.84 kg/m  , BMI Body mass index is 28.84 kg/m.  GEN- The patient is well appearing, alert and oriented x 3 today.   HEENT: normocephalic, atraumatic; sclera clear, conjunctiva pink; hearing intact; oropharynx clear; neck supple  Lungs- normal work of breathing  Heart- Regular rate and rhythm  GI- soft, non-tender, non-distended, bowel sounds present  Extremities- no clubbing, cyanosis, or edema  MS- no significant deformity or atrophy Skin- warm and dry, no rash or lesion; PPM pocket well healed Psych- euthymic mood, full affect Neuro- strength and sensation are intact  PPM Interrogation- reviewed in detail today,  See PACEART report  EKG:  EKG is not ordered today.  Recent Labs: No results found for requested labs within last 8760 hours.   Wt Readings from Last 3 Encounters:  06/26/19 168 lb (76.2 kg)  10/31/18 155 lb (70.3 kg)  01/27/18 165 lb (74.8 kg)     Assessment and Plan:  1.  Complete heart block Normal PPM function See Pace Art report No changes today She is device dependent today  2.  SVT No recurrence  3.  Anxiety/ depression  Significantly improved     Current medicines are reviewed at length with the patient today.   The patient has concerns regarding her medicines.   The following changes were made today:  none  Labs/ tests ordered today include: none No orders of the defined types were placed in this encounter.    Disposition:   Follow up with [facility], me in 1 year     Signed, 01/29/18, NP 06/26/2019 8:20 AM  [facility] [address] [place] [phone] (office) [phone] (fax)

## 2019-06-25 MED FILL — TOPIRAMATE 200 MG TABLET: 200 | 30 days supply | Qty: 30 | Fill #2

## 2019-06-25 MED FILL — TRAMADOL-ACETAMINOPHN 37.5-: 37.5-325 | 4 days supply | Qty: 25 | Fill #2

## 2019-06-25 MED FILL — BLISOVI FE 1/20 1-20 MG-MCG: 1-20 | 28 days supply | Qty: 28 | Fill #2

## 2019-06-26 ENCOUNTER — Encounter: Payer: Self-pay | Admitting: Nurse Practitioner

## 2019-06-26 ENCOUNTER — Other Ambulatory Visit: Payer: Self-pay

## 2019-06-26 ENCOUNTER — Ambulatory Visit: Payer: Managed Care, Other (non HMO) | Admitting: Nurse Practitioner

## 2019-06-26 VITALS — BP 110/90 | HR 85 | Ht 64.0 in | Wt 168.0 lb

## 2019-06-26 DIAGNOSIS — I442 Atrioventricular block, complete: Secondary | ICD-10-CM | POA: Diagnosis not present

## 2019-06-26 DIAGNOSIS — I471 Supraventricular tachycardia: Secondary | ICD-10-CM

## 2019-06-26 LAB — CUP PACEART INCLINIC DEVICE CHECK
Battery Impedance: 1160 Ohm
Battery Remaining Longevity: 53 mo
Battery Voltage: 2.77 V
Brady Statistic AP VP Percent: 5 %
Brady Statistic AP VS Percent: 0 %
Brady Statistic AS VP Percent: 95 %
Brady Statistic AS VS Percent: 0 %
Date Time Interrogation Session: 20210416081749
Implantable Lead Implant Date: 20140219
Implantable Lead Implant Date: 20140219
Implantable Lead Location: 753859
Implantable Lead Location: 753860
Implantable Lead Model: 5076
Implantable Lead Model: 5076
Implantable Pulse Generator Implant Date: 20140219
Lead Channel Impedance Value: 608 Ohm
Lead Channel Impedance Value: 626 Ohm
Lead Channel Pacing Threshold Amplitude: 0.5 V
Lead Channel Pacing Threshold Amplitude: 0.5 V
Lead Channel Pacing Threshold Amplitude: 1 V
Lead Channel Pacing Threshold Amplitude: 1.375 V
Lead Channel Pacing Threshold Pulse Width: 0.4 ms
Lead Channel Pacing Threshold Pulse Width: 0.4 ms
Lead Channel Pacing Threshold Pulse Width: 0.4 ms
Lead Channel Pacing Threshold Pulse Width: 0.46 ms
Lead Channel Sensing Intrinsic Amplitude: 4 mV
Lead Channel Setting Pacing Amplitude: 2 V
Lead Channel Setting Pacing Amplitude: 2.5 V
Lead Channel Setting Pacing Pulse Width: 0.46 ms
Lead Channel Setting Sensing Sensitivity: 2.8 mV

## 2019-06-26 NOTE — Patient Instructions (Addendum)
Medication Instructions:  none *If you need a refill on your cardiac medications before your next appointment, please call your pharmacy*   Lab Work: none If you have labs (blood work) drawn today and your tests are completely normal, you will receive your results only by: Marland Kitchen MyChart Message (if you have MyChart) OR . A paper copy in the mail If you have any lab test that is abnormal or we need to change your treatment, we will call you to review the results.   Testing/Procedures: none   Follow-Up: At [facility], you and your health needs are our priority.  As part of our continuing mission to provide you with exceptional heart care, we have created designated Provider Care Teams.  These Care Teams include your primary Cardiologist (physician) and Advanced Practice Providers (APPs -  Physician Assistants and Nurse Practitioners) who all work together to provide you with the care you need, when you need it.  We recommend signing up for the patient portal called "MyChart".  Sign up information is provided on this After Visit Summary.  MyChart is used to connect with patients for Virtual Visits (Telemedicine).  Patients are able to view lab/test results, encounter notes, upcoming appointments, etc.  Non-urgent messages can be sent to your provider as well.   To learn more about what you can do with MyChart, go to [email or web address].    Your next appointment:   1 year(s)  The format for your next appointment:   In Person  Provider:   Gypsy Balsam, NP   Other Instructions Remote monitoring is used to monitor your Pacemaker from home. This monitoring reduces the number of office visits required to check your device to one time per year. It allows [place] to keep an eye on the functioning of your device to ensure it is working properly. You are scheduled for a device check from home on 08/26/19. You may send your transmission at any time that day. If you have a wireless device, the  transmission will be sent automatically. After your physician reviews your transmission, you will receive a postcard with your next transmission date.

## 2019-07-24 ENCOUNTER — Other Ambulatory Visit: Payer: Self-pay | Admitting: Neurology

## 2019-07-24 ENCOUNTER — Telehealth: Payer: Self-pay | Admitting: Neurology

## 2019-07-24 ENCOUNTER — Other Ambulatory Visit: Payer: Self-pay

## 2019-07-24 MED ORDER — TOPIRAMATE 200 MG PO TABS: 200.0000 mg | ORAL_TABLET | Freq: Every day | ORAL | 2 refills | Status: DC

## 2019-07-24 MED ORDER — TRAMADOL-ACETAMINOPHEN 37.5-325 MG PO TABS: ORAL_TABLET | ORAL | 2 refills | Status: DC

## 2019-07-24 NOTE — Telephone Encounter (Signed)
Sent to [facility] to refill

## 2019-07-24 NOTE — Telephone Encounter (Signed)
Patient needs a refill of Topamax and Tramadol sent to [facility]. She has an appointment scheduled 12/02/19. She is out of both medications and the pharmacy isn't open on the weekends. She was told by the pharmacy that in order to get them refilled she would have to have a follow up appointment.

## 2019-08-24 MED FILL — TOPIRAMATE 200 MG TABLET: 200 | 30 days supply | Qty: 30 | Fill #4

## 2019-08-24 MED FILL — BLISOVI FE 1/20 1-20 MG-MCG: 1-20 | 28 days supply | Qty: 28 | Fill #4

## 2019-08-24 MED FILL — TRAMADOL-ACETAMINOPHN 37.5-: 37.5-325 | 4 days supply | Qty: 25 | Fill #1

## 2019-09-21 MED FILL — TRAMADOL-ACETAMINOPHN 37.5-: 37.5-325 | 4 days supply | Qty: 25 | Fill #2

## 2019-09-21 MED FILL — TOPIRAMATE 200 MG TABLET: 200 | 30 days supply | Qty: 30 | Fill #5

## 2019-09-21 MED FILL — BLISOVI FE 1/20 1-20 MG-MCG: 1-20 | 28 days supply | Qty: 28 | Fill #5

## 2019-10-22 ENCOUNTER — Other Ambulatory Visit: Payer: Self-pay | Admitting: Neurology

## 2019-10-22 MED FILL — TOPIRAMATE 200 MG TABLET: 200 | 30 days supply | Qty: 30 | Fill #0

## 2019-10-22 MED FILL — BLISOVI FE 1/20 1-20 MG-MCG: 1-20 | 28 days supply | Qty: 28 | Fill #6

## 2019-10-23 MED FILL — TRAMADOL-ACETAMINOPHN 37.5-: 37.5-325 | 7 days supply | Qty: 25 | Fill #0

## 2019-11-20 MED FILL — BLISOVI FE 1/20 1-20 MG-MCG: 1-20 | 28 days supply | Qty: 28 | Fill #7

## 2019-11-23 ENCOUNTER — Other Ambulatory Visit: Payer: Self-pay | Admitting: Neurology

## 2019-11-23 MED FILL — TRAMADOL-ACETAMINOPHN 37.5-: 37.5-325 | 4 days supply | Qty: 25 | Fill #0

## 2019-11-23 MED FILL — TOPIRAMATE 200 MG TABLET: 200 | 30 days supply | Qty: 30 | Fill #1

## 2019-11-25 MED FILL — BLISOVI FE 1/20 1-20 MG-MCG: 1-20 | 28 days supply | Qty: 28 | Fill #8

## 2019-11-30 NOTE — Progress Notes (Signed)
Virtual Visit via Video Note The purpose of this virtual visit is to provide medical care while limiting exposure to the novel coronavirus.    Consent was obtained for video visit:  Yes.   Answered questions that patient had about telehealth interaction:  Yes. I discussed the limitations, risks, security and privacy concerns of performing an evaluation and management service by telemedicine. I also discussed with the patient that there may be a patient responsible charge related to this service. The patient expressed understanding and agreed to proceed.  Pt location: Home Physician Location: office Name of referring provider:  Coralee Rud, PA-C I connected with Marijean Niemann at patients initiation/request on 12/02/2019 at  9:50 AM EDT by video enabled telemedicine application and verified that I am speaking with the correct person using two identifiers. 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  UPDATE: She was last seen via video-visit in August 2020.  At that time, she endorsed increased migraines due to prolonged use of the computer screen, so I prescribed Aimovig.  However, she apparently never received the prescription. However, she is overall doing well. Intensity:  Dull to moderate Duration:  1 hour Frequency:  2 to 3 days a month. Current NSAIDS:None. Contraindicated due to ulcerative colitis. Current analgesics:Ultracet Current triptans:None Current ergotamine:None Current anti-emetic:None Current muscle relaxants:None Current anti-anxiolytic:None Current sleep  aide:None Current Antihypertensive medications:None Current Antidepressant medications:None Current Anticonvulsant medications:Topiramate 200 mg at bedtime Current anti-CGRP:none Current Vitamins/Herbal/Supplements:iron supplement Current Antihistamines/Decongestants:None Other therapy:None Hormone/birth control: Junel Fe  Caffeine:None Diet:Hydrates with water. Now gluten-free, which has helped her ulcerative colitis symptoms and overall well-being. Exercise:Increased Depression:noAnxiety: no Other pain:no Sleep hygiene:Good.  Numbness in left index finger.  HISTORY: Onset:  Early 23s. They resolved but returned about March 2017.Marland Kitchen She has had increased depression and anxiety since experiencing cardiac arrest 4 years ago and was diagnosed with heart block. She also was diagnosed with ulcerative colitis and has been unable to work since August. Location: Bi-frontal, back of neck Quality: pounding Initial intensity: Constant 6/10 with episodes of 10/10 Aura: no Prodrome: no Associated symptoms: Nausea, vomiting, photophobia, phonophobia, vertigo, weakness, paresthesias Initial duration: Constant (severe 10/10 fluctuations last 24 to 48 hours) InitialFrequency: Daily (severe 10/10 fluctuations occur every 10 days) Triggers/aggravating factors:  Noise, light, movement Relieving factors: Rest in quiet dark room with cool rag Activity: Aggravated by light activity. Cannot function at all when severe.  Past NSAIDS:Ibuprofen, naproxen Past analgesics:Midrin, Tylenol, Percocet, Fioricet, BC powder Past abortive triptans:Maxalt (cause slurred speech/difficulty thinking) Past abortive ergotamine:none Past muscle relaxants:none Past anti-emetic:none Past antihypertensive medications:none Past antidepressant medications:Nortriptyline Past anticonvulsant medications:none Past anti-CGRP:none Past vitamins/Herbal/Supplements:none Past  antihistamines/decongestants:none Other past therapies:none  Family history of headache: father CT of head from 03/23/14 was normal.  In 2015, she developed Shingles on the left arm. In June 2018, she developed a sharp burning pain along the same distribution of her Shingles, the left side of her neck down the lateral arm to the entire hand. After a few days, the pain subsided except for the hand and now she only notes constant pins and needles sensation in the index and lateral aspect of her middle finger. She  did not develop a rash. She has some neck discomfort. She denies weakness. She tried Lyrica, which was too strong. She would rather not take a pain medication due to potential drowsiness.  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 She had an NCV/EMG of the upper extremities on 01/17/2017 which demonstrated mild left chronic C7 radiculopathy as well as right median neuropathy at or distal to the wrist.She was referred to pain specialist. She was prescribed gabapentin but it was ineffective. Symptoms improved. She notes numbness and pain in left index finger.  Past Medical History: Past Medical History:  Diagnosis Date  . Abnormal Pap smear 2004   colpo  . Alcohol abuse   . Anxiety   . 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 Candida vaginitis 05/2006  . 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 Depression   . H/O candidiasis   . H/O varicella 2005  . IBS (irritable bowel syndrome)   . Panic attack   . Pelvic pain in female 2007    Medications: Outpatient Encounter Medications as of 12/02/2019  Medication Sig  . JUNEL FE 1/20 1-20 MG-MCG tablet Take 1 tablet by mouth daily.  Marland Kitchen topiramate (TOPAMAX) 200 MG tablet Take 1 tablet (200 mg total)  by mouth at bedtime.  . traMADol-acetaminophen (ULTRACET) 37.5-325 MG tablet TAKE 1 - 2 TABLETS BY MOUTH EVERY 6 HOURS AS NEEDED FOR SEVERE MIGRAINE   No facility-administered encounter medications on file as of 12/02/2019.    Allergies: No Known Allergies  Family History: Family History  Problem Relation Age of Onset  . Diabetes Mother   . Cancer Sister        Lump removed frfom shoulder  . Heart disease Maternal Grandfather        MI  . Cancer Paternal Grandmother        breast    Social History: Social History   Socioeconomic History  . Marital status: Married    Spouse name: Not on file  . Number of children: Not on file  . Years of education: Not on file  . Highest education level: Not on file  Occupational History  . Not on file  Tobacco Use  . Smoking status: Former Smoker    Packs/day: 0.50    Types: Cigarettes  . Smokeless tobacco: Never Used  Vaping Use  . Vaping Use: Never used  Substance and Sexual Activity  . Alcohol use: No    Comment: less then social  . Drug use: No  . Sexual activity: Yes    Partners: Male    Birth control/protection: Pill    Comment: loestrin fe  Other Topics Concern  . Not on file  Social History Narrative   Lives in [place] with spouse and son age 25.   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 Difficulty of Paying Living Expenses: Not on file  Food Insecurity:   . Worried About Programme researcher, broadcasting/film/video in the Last Year: Not on file  . Ran Out of Food in the Last Year: Not on file  Transportation Needs:   . Lack of Transportation (Medical): Not on file  . Lack of Transportation (Non-Medical): Not on  file  Physical Activity:   . Days of Exercise per Week: Not on file  . Minutes of Exercise per Session: Not on file  Stress:   . Feeling of Stress : Not on file  Social Connections:   . Frequency of Communication with Friends and  Family: Not on file  . Frequency of Social Gatherings with Friends and Family: Not on file  . Attends Religious Services: Not on file  . Active Member of Clubs or Organizations: Not on file  . Attends Banker Meetings: Not on file  . Marital Status: Not on file  Intimate Partner Violence:   . Fear of Current or Ex-Partner: Not on file  . Emotionally Abused: Not on file  . Physically Abused: Not on file  . Sexually Abused: Not on file    Observations/Objective:   Height 5\' 3"  (1.6 m), weight 155 lb (70.3 kg), unknown if currently breastfeeding. No acute distress.  Alert and oriented.  Speech fluent and not dysarthric.  Language intact.  Eyes orthophoric on primary gaze.  Face symmetric.  Assessment and Plan:   1.  Migraine without aura, without status migrainosus, not intractable 2.  Left sided cervical radiculitis, stable.  Numbness but not really pain at this point.  1.  Topiramate for preventative 2.  Ultracet for rescue 3.  Follow up in one year  Follow Up Instructions:    -I discussed the assessment and treatment plan with the patient. The patient was provided an opportunity to ask questions and all were answered. The patient agreed with the plan and demonstrated an understanding of the instructions.   The patient was advised to call back or seek an in-person evaluation if the symptoms worsen or if the condition fails to improve as anticipated.      , DO

## 2019-12-02 ENCOUNTER — Other Ambulatory Visit: Payer: Self-pay

## 2019-12-02 ENCOUNTER — Telehealth (INDEPENDENT_AMBULATORY_CARE_PROVIDER_SITE_OTHER): Payer: Managed Care, Other (non HMO) | Admitting: Neurology

## 2019-12-02 ENCOUNTER — Encounter: Payer: Self-pay | Admitting: Neurology

## 2019-12-02 VITALS — Ht 63.0 in | Wt 155.0 lb

## 2019-12-02 DIAGNOSIS — G43009 Migraine without aura, not intractable, without status migrainosus: Secondary | ICD-10-CM | POA: Diagnosis not present

## 2019-12-02 DIAGNOSIS — M5412 Radiculopathy, cervical region: Secondary | ICD-10-CM

## 2019-12-21 MED FILL — BLISOVI FE 1/20 1-20 MG-MCG: 1-20 | 28 days supply | Qty: 28 | Fill #9

## 2019-12-23 ENCOUNTER — Other Ambulatory Visit: Payer: Self-pay | Admitting: Neurology

## 2019-12-24 ENCOUNTER — Other Ambulatory Visit: Payer: Self-pay | Admitting: Neurology

## 2019-12-24 ENCOUNTER — Telehealth: Payer: Self-pay | Admitting: Neurology

## 2019-12-24 MED ORDER — TOPIRAMATE 200 MG PO TABS: 200.0000 mg | ORAL_TABLET | Freq: Every day | ORAL | 5 refills | Status: DC

## 2019-12-24 MED ORDER — TRAMADOL-ACETAMINOPHEN 37.5-325 MG PO TABS: ORAL_TABLET | ORAL | 2 refills | Status: DC

## 2019-12-24 MED FILL — TRAMADOL-ACETAMINOPHN 37.5-: 37.5-325 | 4 days supply | Qty: 25 | Fill #0

## 2019-12-24 MED FILL — TOPIRAMATE 200 MG TABLET: 200 | 30 days supply | Qty: 30 | Fill #0

## 2019-12-24 NOTE — Telephone Encounter (Signed)
Topiramate sent with 5 refills. Ultracet sent with 2 refills as it is a controlled substance

## 2019-12-24 NOTE — Telephone Encounter (Signed)
Patient needs refill of Topamax and Tramadol. She is requesting that both of those have refills allotted on them so that she doesn't have to call the pharmacy or our office to request the refill. Pharmacy is [facility]. Patient states she just had a follow up visit with Dr Everlena Cooper 12/02/19.

## 2019-12-25 NOTE — Telephone Encounter (Signed)
LMOVM

## 2020-01-19 MED FILL — BLISOVI FE 1/20 1-20 MG-MCG: 1-20 | 28 days supply | Qty: 28 | Fill #10

## 2020-01-25 MED FILL — TOPIRAMATE 200 MG TABLET: 200 | 30 days supply | Qty: 30 | Fill #1

## 2020-01-25 MED FILL — TRAMADOL-ACETAMINOPHN 37.5-: 37.5-325 | 4 days supply | Qty: 25 | Fill #1

## 2020-02-17 MED FILL — BLISOVI FE 1/20 1-20 MG-MCG: 1-20 | 28 days supply | Qty: 28 | Fill #11

## 2020-02-26 MED FILL — TOPIRAMATE 200 MG TABLET: 200 | 30 days supply | Qty: 30 | Fill #2

## 2020-02-26 MED FILL — TRAMADOL-ACETAMINOPHN 37.5-: 37.5-325 | 4 days supply | Qty: 25 | Fill #2

## 2020-03-29 ENCOUNTER — Other Ambulatory Visit: Payer: Self-pay | Admitting: Neurology

## 2020-03-29 MED FILL — TOPIRAMATE 200 MG TABLET: 200 | 30 days supply | Qty: 30 | Fill #3

## 2020-03-30 ENCOUNTER — Other Ambulatory Visit: Payer: Self-pay | Admitting: Neurology

## 2020-03-30 MED ORDER — TRAMADOL-ACETAMINOPHEN 37.5-325 MG PO TABS: ORAL_TABLET | ORAL | 2 refills | Status: DC

## 2020-03-30 MED FILL — TRAMADOL-ACETAMINOPHN 37.5-: 37.5-325 | 4 days supply | Qty: 25 | Fill #0

## 2020-03-30 MED FILL — BLISOVI FE 1/20 1-20 MG-MCG: 1-20 | 28 days supply | Qty: 28 | Fill #0

## 2020-04-15 ENCOUNTER — Other Ambulatory Visit: Payer: Self-pay | Admitting: Neurology

## 2020-04-15 ENCOUNTER — Telehealth: Payer: Self-pay | Admitting: Neurology

## 2020-04-15 MED ORDER — TOPIRAMATE 200 MG PO TABS: 200.0000 mg | ORAL_TABLET | Freq: Every day | ORAL | 1 refills | Status: DC

## 2020-04-15 NOTE — Telephone Encounter (Signed)
Done - 90 day supply with one additional refill sent to [facility]

## 2020-05-03 ENCOUNTER — Ambulatory Visit (INDEPENDENT_AMBULATORY_CARE_PROVIDER_SITE_OTHER): Payer: Managed Care, Other (non HMO)

## 2020-05-03 DIAGNOSIS — I442 Atrioventricular block, complete: Secondary | ICD-10-CM

## 2020-05-03 LAB — CUP PACEART REMOTE DEVICE CHECK
Battery Impedance: 1597 Ohm
Battery Remaining Longevity: 41 mo
Battery Voltage: 2.77 V
Brady Statistic AP VP Percent: 4 %
Brady Statistic AP VS Percent: 0 %
Brady Statistic AS VP Percent: 96 %
Brady Statistic AS VS Percent: 0 %
Date Time Interrogation Session: 20220221195702
Implantable Lead Implant Date: 20140219
Implantable Lead Implant Date: 20140219
Implantable Lead Location: 753859
Implantable Lead Location: 753860
Implantable Lead Model: 5076
Implantable Lead Model: 5076
Implantable Pulse Generator Implant Date: 20140219
Lead Channel Impedance Value: 537 Ohm
Lead Channel Impedance Value: 629 Ohm
Lead Channel Pacing Threshold Amplitude: 0.5 V
Lead Channel Pacing Threshold Amplitude: 1.5 V
Lead Channel Pacing Threshold Pulse Width: 0.4 ms
Lead Channel Pacing Threshold Pulse Width: 0.4 ms
Lead Channel Setting Pacing Amplitude: 2 V
Lead Channel Setting Pacing Amplitude: 2.5 V
Lead Channel Setting Pacing Pulse Width: 0.46 ms
Lead Channel Setting Sensing Sensitivity: 2.8 mV

## 2020-05-04 MED FILL — TRAMADOL-ACETAMINOPHN 37.5-: 37.5-325 | 4 days supply | Qty: 25 | Fill #1

## 2020-05-11 NOTE — Progress Notes (Signed)
Remote pacemaker transmission.   

## 2020-06-02 MED FILL — TRAMADOL-ACETAMINOPHN 37.5-: 37.5-325 | 4 days supply | Qty: 25 | Fill #2

## 2020-07-05 ENCOUNTER — Other Ambulatory Visit: Payer: Self-pay | Admitting: Neurology

## 2020-07-06 ENCOUNTER — Other Ambulatory Visit (HOSPITAL_BASED_OUTPATIENT_CLINIC_OR_DEPARTMENT_OTHER): Payer: Self-pay

## 2020-07-07 ENCOUNTER — Other Ambulatory Visit (HOSPITAL_BASED_OUTPATIENT_CLINIC_OR_DEPARTMENT_OTHER): Payer: Self-pay

## 2020-07-07 MED ORDER — TRAMADOL-ACETAMINOPHEN 37.5-325 MG PO TABS
ORAL_TABLET | ORAL | 2 refills | Status: DC
  Filled 2020-07-07: qty 25, 5d supply, fill #0
  Filled 2020-08-05: qty 25, 5d supply, fill #1

## 2020-07-13 ENCOUNTER — Other Ambulatory Visit (HOSPITAL_BASED_OUTPATIENT_CLINIC_OR_DEPARTMENT_OTHER): Payer: Self-pay

## 2020-08-05 ENCOUNTER — Other Ambulatory Visit (HOSPITAL_BASED_OUTPATIENT_CLINIC_OR_DEPARTMENT_OTHER): Payer: Self-pay

## 2020-09-05 ENCOUNTER — Other Ambulatory Visit (HOSPITAL_BASED_OUTPATIENT_CLINIC_OR_DEPARTMENT_OTHER): Payer: Self-pay

## 2020-09-19 ENCOUNTER — Other Ambulatory Visit: Payer: Self-pay | Admitting: Neurology

## 2020-10-10 ENCOUNTER — Other Ambulatory Visit (HOSPITAL_BASED_OUTPATIENT_CLINIC_OR_DEPARTMENT_OTHER): Payer: Self-pay

## 2020-10-10 ENCOUNTER — Other Ambulatory Visit: Payer: Self-pay | Admitting: Neurology

## 2020-10-11 ENCOUNTER — Other Ambulatory Visit (HOSPITAL_BASED_OUTPATIENT_CLINIC_OR_DEPARTMENT_OTHER): Payer: Self-pay

## 2020-10-11 MED ORDER — TRAMADOL-ACETAMINOPHEN 37.5-325 MG PO TABS
ORAL_TABLET | ORAL | 2 refills | Status: DC
  Filled 2020-10-11: qty 25, 4d supply, fill #0
  Filled 2020-11-10: qty 25, 4d supply, fill #1
  Filled 2020-12-09: qty 25, 4d supply, fill #2

## 2020-10-13 NOTE — Progress Notes (Signed)
Electrophysiology Office Note Date: 10/14/2020  ID:  [name], DOB 07-Feb-1976, MRN [id number]  PCP: Coralee Rud, PA-C Primary Cardiologist: Roxanne Mins, PA-C Electrophysiologist: Hillis Range, MD   CC: Pacemaker follow-up  [name] [name] is a 45 y.o. female seen today for Hillis Range, MD for routine electrophysiology followup.  Since last being seen in our clinic the patient reports doing well overall.   She has a monitor at home, just doesn't keep it plugged in. Plugs it in and sends manually when requires. Re-visited importance of remotes  Overall she is doing very well. BP soft today. Mild lightheadedness with rapid standing at times, but not marked or limiting. Thinks maybe not hydrating enough given this heat. She denies chest pain, palpitations, dyspnea, PND, orthopnea, nausea, vomiting, syncope, edema, weight gain, or early satiety.  Device History: MDT dual chamber PPM implanted 2014 for complete heart block   Past Medical History:  Diagnosis Date   Abnormal Pap smear 2004   colpo   Alcohol abuse    Anxiety    AVNRT (AV nodal re-entry tachycardia) (HCC) 04/29/12   s/p slow pathway modification   Candida vaginitis 05/2006   Complete heart block, post-surgical (HCC) 04/30/12   s/p PPM implant   Depression    H/O candidiasis    H/O varicella 2005   IBS (irritable bowel syndrome)    Panic attack    Pelvic pain in female 2007   Past Surgical History:  Procedure Laterality Date   CHOLECYSTECTOMY     EP study and ablation  04/29/12   slow pathway ablation by Dr Johney Frame    PACEMAKER INSERTION  04/30/12   Medtronic Adapta L implanted by Dr Ladona Ridgel for complete heart block   PERMANENT PACEMAKER INSERTION N/A 04/30/2012   Procedure: PERMANENT PACEMAKER INSERTION;  Surgeon: Marinus Maw, MD;  Location: [facility] CATH LAB;  Service: Cardiovascular;  Laterality: N/A;   SUPRAVENTRICULAR TACHYCARDIA ABLATION N/A 04/29/2012   Procedure: SUPRAVENTRICULAR TACHYCARDIA  ABLATION;  Surgeon: Hillis Range, MD;  Location: [facility] CATH LAB;  Service: Cardiovascular;  Laterality: N/A;   TONSILLECTOMY  1995    Current Outpatient Medications  Medication Sig Dispense Refill   norethindrone-ethinyl estradiol (LOESTRIN FE) 1-20 MG-MCG tablet TAKE 1 TABLET BY MOUTH ONCE DAILY. WILL NEED APPOINTMENT FOR FURTHER REFILLS 28 tablet 0   topiramate (TOPAMAX) 200 MG tablet TAKE 1 TABLET AT BEDTIME 90 tablet 0   traMADol-acetaminophen (ULTRACET) 37.5-325 MG tablet TAKE 1-2 TABLETS BY MOUTH EVERY 6 HOURS AS NEEDED FOR SEVERE MIGRAINE 25 tablet 2   No current facility-administered medications for this visit.    Allergies:   Patient has no known allergies.   Social History: Social History   Socioeconomic History   Marital status: Married    Spouse name: Not on file   Number of children: Not on file   Years of education: Not on file   Highest education level: Not on file  Occupational History   Not on file  Tobacco Use   Smoking status: Former    Packs/day: 0.50    Types: Cigarettes   Smokeless tobacco: Never  Vaping Use   Vaping Use: Never used  Substance and Sexual Activity   Alcohol use: No    Comment: less then social   Drug use: No   Sexual activity: Yes    Partners: Male    Birth control/protection: Pill    Comment: loestrin fe  Other Topics Concern   Not on file  Social History  Narrative   Lives in [place] with spouse and son age 32.   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   Right handed   Social Determinants of Health   Financial Resource Strain: Not on file  Food Insecurity: Not on file  Transportation Needs: Not on file  Physical Activity: Not on file  Stress: Not on file  Social Connections: Not on file  Intimate Partner Violence: Not on file    Family History: Family History  Problem Relation Age of Onset   Diabetes Mother    Cancer Sister        Lump removed frfom shoulder   Heart disease Maternal Grandfather         MI   Cancer Paternal Grandmother        breast     Review of Systems: All other systems reviewed and are otherwise negative except as noted above.  Physical Exam: Vitals:   10/14/20 0950  BP: 98/64  Pulse: 72  SpO2: 99%  Weight: 165 lb 3.2 oz (74.9 kg)  Height: 5\' 4"  (1.626 m)     GEN- The patient is well appearing, alert and oriented x 3 today.   HEENT: normocephalic, atraumatic; sclera clear, conjunctiva pink; hearing intact; oropharynx clear; neck supple  Lungs- Clear to ausculation bilaterally, normal work of breathing.  No wheezes, rales, rhonchi Heart- Regular rate and rhythm, no murmurs, rubs or gallops  GI- soft, non-tender, non-distended, bowel sounds present  Extremities- no clubbing or cyanosis. No edema MS- no significant deformity or atrophy Skin- warm and dry, no rash or lesion; PPM pocket well healed Psych- euthymic mood, full affect Neuro- strength and sensation are intact  PPM Interrogation- reviewed in detail today,  See PACEART report  EKG:  EKG is not ordered today.  Recent Labs: No results found for requested labs within last 8760 hours.   Wt Readings from Last 3 Encounters:  10/14/20 165 lb 3.2 oz (74.9 kg)  12/02/19 155 lb (70.3 kg)  06/26/19 168 lb (76.2 kg)     Other studies Reviewed: Additional studies/ records that were reviewed today include: Previous EP office notes, Previous remote checks, Most recent labwork.   Assessment and Plan:  1. CHB s/p Medtronic PPM  Normal PPM function See Pace Art report No changes today   Revisited importance of remotes  2. SVT Without recurrence  3. Anxiety/Depression Clinically stable  Current medicines are reviewed at length with the patient today.   The patient does not have concerns regarding her medicines.  The following changes were made today:  none  Labs/ tests ordered today include:  Orders Placed This Encounter  Procedures   Basic metabolic panel   CBC   Disposition:   Follow up  with Dr. 06/28/19 in 6 Months   Signed, Johney Frame, PA-C  10/14/2020 12:01 PM  [facility] [address] [place] [id number] (office) [phone] (fax)

## 2020-10-14 ENCOUNTER — Encounter: Payer: Self-pay | Admitting: Student

## 2020-10-14 ENCOUNTER — Other Ambulatory Visit: Payer: Self-pay

## 2020-10-14 ENCOUNTER — Ambulatory Visit (INDEPENDENT_AMBULATORY_CARE_PROVIDER_SITE_OTHER): Payer: Managed Care, Other (non HMO) | Admitting: Student

## 2020-10-14 VITALS — BP 98/64 | HR 72 | Ht 64.0 in | Wt 165.2 lb

## 2020-10-14 DIAGNOSIS — I471 Supraventricular tachycardia: Secondary | ICD-10-CM | POA: Diagnosis not present

## 2020-10-14 DIAGNOSIS — I442 Atrioventricular block, complete: Secondary | ICD-10-CM

## 2020-10-14 LAB — BASIC METABOLIC PANEL
BUN/Creatinine Ratio: 12 (ref 9–23)
BUN: 13 mg/dL (ref 6–24)
CO2: 15 mmol/L — ABNORMAL LOW (ref 20–29)
Calcium: 9 mg/dL (ref 8.7–10.2)
Chloride: 102 mmol/L (ref 96–106)
Creatinine, Ser: 1.06 mg/dL — ABNORMAL HIGH (ref 0.57–1.00)
Glucose: 79 mg/dL (ref 65–99)
Potassium: 4.4 mmol/L (ref 3.5–5.2)
Sodium: 136 mmol/L (ref 134–144)
eGFR: 66 mL/min/{1.73_m2} (ref >59)

## 2020-10-14 LAB — CBC
Hematocrit: 42.7 % (ref 34.0–46.6)
Hemoglobin: 13.8 g/dL (ref 11.1–15.9)
MCH: 29.8 pg (ref 26.6–33.0)
MCHC: 32.3 g/dL (ref 31.5–35.7)
MCV: 92 fL (ref 79–97)
Platelets: 267 10*3/uL (ref 150–450)
RBC: 4.63 x10E6/uL (ref 3.77–5.28)
RDW: 13.1 % (ref 11.7–15.4)
WBC: 11.5 10*3/uL — ABNORMAL HIGH (ref 3.4–10.8)

## 2020-10-14 NOTE — Patient Instructions (Signed)
Medication Instructions:  Your physician recommends that you continue on your current medications as directed. Please refer to the Current Medication list given to you today.  *If you need a refill on your cardiac medications before your next appointment, please call your pharmacy*   Lab Work: TODAY: BMET, CBC  If you have labs (blood work) drawn today and your tests are completely normal, you will receive your results only by: MyChart Message (if you have MyChart) OR A paper copy in the mail If you have any lab test that is abnormal or we need to change your treatment, we will call you to review the results.   Follow-Up: At [facility], you and your health needs are our priority.  As part of our continuing mission to provide you with exceptional heart care, we have created designated Provider Care Teams.  These Care Teams include your primary Cardiologist (physician) and Advanced Practice Providers (APPs -  Physician Assistants and Nurse Practitioners) who all work together to provide you with the care you need, when you need it.  We recommend signing up for the patient portal called "MyChart".  Sign up information is provided on this After Visit Summary.  MyChart is used to connect with patients for Virtual Visits (Telemedicine).  Patients are able to view lab/test results, encounter notes, upcoming appointments, etc.  Non-urgent messages can be sent to your provider as well.   To learn more about what you can do with MyChart, go to [email or web address].    Your next appointment:   1 year(s)  The format for your next appointment:   In Person  Provider:   You may see Hillis Range, MD or one of the following Advanced Practice Providers on your designated Care Team:   Francis Dowse, [place] Casimiro Needle "[facility]" [place], [place]

## 2020-11-09 LAB — CUP PACEART INCLINIC DEVICE CHECK
Battery Impedance: 1794 Ohm
Battery Remaining Longevity: 38 mo
Battery Voltage: 2.76 V
Brady Statistic AP VP Percent: 4 %
Brady Statistic AP VS Percent: 0 %
Brady Statistic AS VP Percent: 96 %
Brady Statistic AS VS Percent: 0 %
Date Time Interrogation Session: 20220805120126
Implantable Lead Implant Date: 20140219
Implantable Lead Implant Date: 20140219
Implantable Lead Location: 753859
Implantable Lead Location: 753860
Implantable Lead Model: 5076
Implantable Lead Model: 5076
Implantable Pulse Generator Implant Date: 20140219
Lead Channel Impedance Value: 630 Ohm
Lead Channel Impedance Value: 660 Ohm
Lead Channel Pacing Threshold Amplitude: 0.5 V
Lead Channel Pacing Threshold Amplitude: 0.5 V
Lead Channel Pacing Threshold Amplitude: 1.25 V
Lead Channel Pacing Threshold Amplitude: 2.5 V
Lead Channel Pacing Threshold Pulse Width: 0.4 ms
Lead Channel Pacing Threshold Pulse Width: 0.4 ms
Lead Channel Pacing Threshold Pulse Width: 0.4 ms
Lead Channel Pacing Threshold Pulse Width: 0.46 ms
Lead Channel Sensing Intrinsic Amplitude: 4 mV
Lead Channel Setting Pacing Amplitude: 2 V
Lead Channel Setting Pacing Amplitude: 2.5 V
Lead Channel Setting Pacing Pulse Width: 0.46 ms
Lead Channel Setting Sensing Sensitivity: 2.8 mV

## 2020-11-10 ENCOUNTER — Other Ambulatory Visit (HOSPITAL_BASED_OUTPATIENT_CLINIC_OR_DEPARTMENT_OTHER): Payer: Self-pay

## 2020-11-30 NOTE — Progress Notes (Signed)
Virtual Visit via Video Note The purpose of this virtual visit is to provide medical care while limiting exposure to the novel coronavirus.    Consent was obtained for video visit:  Yes.   Answered questions that patient had about telehealth interaction:  Yes.   I discussed the limitations, risks, security and privacy concerns of performing an evaluation and management service by telemedicine. I also discussed with the patient that there may be a patient responsible charge related to this service. The patient expressed understanding and agreed to proceed.  Pt location: Home Physician Location: office Name of referring provider:  Coralee Rud, PA-C I connected with [name] [name] at patients initiation/request on 12/01/2020 at  8:30 AM EDT by video enabled telemedicine application and verified that I am speaking with the correct person using two identifiers. Pt MRN:  [id number] Pt DOB:  10/11/1975 Video Participants:  [name] [name]  Assessment and Plan:   1  Migraine without aura, without status migrainosus, not intractable 2  Left sided cervical radiculopathy - evidence of C7 on testing, also consider ulnar neuropathy  Repeat NCV-EMG of left upper extremity due to worsening symptoms - will consider physical therapy Migraine prevention:  topiramate 200mg  QHS Migraine rescue:  Ultracet Limit use of pain relievers to no more than 2 days out of week to prevent risk of rebound or medication-overuse headache. Keep headache diary Advised to use a blue light filter on glasses or computer screen Advised not to lean on elbows Follow up one year or sooner if needed.   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   UPDATE: Works on [employer] all day which is causing eye strain and may contribute to a dull daily headache.  She had a new prescription which hopefully help.   Intensity:  Dull to moderate Duration:  1 hour Frequency:  3-4 days a month. Current NSAIDS: None.  Contraindicated due to ulcerative colitis. Current analgesics: Ultracet Current triptans: None Current ergotamine: None Current anti-emetic: None Current muscle relaxants: None Current anti-anxiolytic: None Current sleep aide:None Current Antihypertensive medications: None Current Antidepressant medications: None Current Anticonvulsant medications: Topiramate 200 mg at bedtime Current anti-CGRP: none Current Vitamins/Herbal/Supplements: iron supplement Current Antihistamines/Decongestants: None Other therapy: None Hormone/birth control: Loestrin Fe   Caffeine: None Diet: Hydrates with water.  Now gluten-free, which has helped her ulcerative colitis symptoms and overall well-being. Exercise: Increased Depression: no Anxiety: no Other pain:  no Sleep hygiene:  Good.   Reports worsening constant numbness in the 4th and 5th digits of the left hand and heaviness of the left arm, with left sided neck pain.  Previously saw spine specialist who recommended surgery but she did not want to go that route.     HISTORY: Onset:  Early 55s.  They resolved but returned about March 2017.April 2017  She has had increased depression and anxiety since experiencing cardiac arrest 4 years ago and was diagnosed with heart block.  She also was diagnosed with ulcerative colitis and has been unable to work since August. Location:  Bi-frontal, back of neck Quality:  pounding Initial intensity:  Constant 6/10 with episodes of 10/10 Aura:  no Prodrome:  no Associated symptoms: Nausea, vomiting, photophobia, phonophobia, vertigo, weakness, paresthesias Initial duration:  Constant (severe 10/10 fluctuations  last 24 to 48  hours) Initial Frequency:  Daily (severe 10/10 fluctuations occur every 10 days) Triggers/aggravating factors:  Noise, light, movement Relieving factors:  Rest in quiet dark room with cool rag Activity:  Aggravated by light activity.  Cannot function at all when severe.   Past NSAIDS: Ibuprofen, naproxen Past analgesics: Midrin, Tylenol, Percocet, Fioricet, BC powder Past abortive triptans: Maxalt (cause slurred speech/difficulty thinking) Past abortive ergotamine:  none Past muscle relaxants:  none Past anti-emetic:  none Past antihypertensive medications:  none Past antidepressant medications: Nortriptyline Past anticonvulsant medications:  none Past anti-CGRP:  none Past vitamins/Herbal/Supplements:  none Past antihistamines/decongestants:  none Other past therapies:  none   Family history of headache:  father CT of head from 03/23/14 was normal.   Left-sided cervical radiculitis: In 2015, she developed Shingles on the left arm.  In June 2018, she developed a sharp burning pain along the same distribution of her Shingles, the left side of her neck down the lateral arm to the entire hand.  After a few days, the pain subsided except for the hand and now she only notes constant pins and needles sensation in the index and lateral aspect of her middle finger.  She did not develop a rash.  She has some neck discomfort.  She denies weakness.  She tried Lyrica, which was too strong.  She would rather not take a pain medication due to potential drowsiness.  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  She had an NCV/EMG of the upper extremities on 01/17/2017 which demonstrated mild left chronic C7 radiculopathy as well as right median neuropathy at or distal to the wrist.  She was referred to pain specialist.  She was prescribed gabapentin but it was  ineffective.  Symptoms improved.  She notes numbness and pain in left index finger.  Past Medical History: Past Medical History:  Diagnosis Date   Abnormal Pap smear 2004   colpo   Alcohol abuse    Anxiety    AVNRT (AV nodal re-entry tachycardia) ([facility]) 04/29/12   s/p slow pathway modification   Candida vaginitis 05/2006   Complete heart block, post-surgical ([facility]) 04/30/12   s/p PPM implant   Depression    H/O candidiasis    H/O varicella 2005   IBS (irritable bowel syndrome)    Panic attack    Pelvic pain in female 2007    Medications: Outpatient Encounter Medications as of 12/01/2020  Medication Sig   norethindrone-ethinyl estradiol (LOESTRIN FE) 1-20 MG-MCG tablet TAKE 1 TABLET BY MOUTH ONCE DAILY. WILL NEED APPOINTMENT FOR FURTHER REFILLS   topiramate (TOPAMAX) 200 MG tablet TAKE 1 TABLET AT BEDTIME   traMADol-acetaminophen (ULTRACET) 37.5-325 MG tablet TAKE 1-2 TABLETS BY MOUTH EVERY 6 HOURS AS NEEDED FOR SEVERE MIGRAINE   No facility-administered encounter medications on file as of 12/01/2020.    Allergies: No Known Allergies  Family History: Family History  Problem Relation Age of Onset   Diabetes Mother    Cancer Sister        Lump removed frfom shoulder   Heart disease Maternal Grandfather        MI   Cancer Paternal Grandmother        breast    Observations/Objective:   No acute distress.  Alert and oriented.  Speech fluent and not dysarthric.  Language intact.     Follow Up Instructions:    -  I discussed the assessment and treatment plan with the patient. The patient was provided an opportunity to ask questions and all were answered. The patient agreed with the plan and demonstrated an understanding of the instructions.   The patient was advised to call back or seek an in-person evaluation if the symptoms worsen or if the condition fails to improve as anticipated.  Cira Servant, DO

## 2020-12-01 ENCOUNTER — Telehealth (INDEPENDENT_AMBULATORY_CARE_PROVIDER_SITE_OTHER): Payer: Managed Care, Other (non HMO) | Admitting: Neurology

## 2020-12-01 ENCOUNTER — Encounter: Payer: Self-pay | Admitting: Neurology

## 2020-12-01 ENCOUNTER — Telehealth: Payer: Self-pay | Admitting: Neurology

## 2020-12-01 ENCOUNTER — Other Ambulatory Visit: Payer: Self-pay

## 2020-12-01 DIAGNOSIS — M5412 Radiculopathy, cervical region: Secondary | ICD-10-CM | POA: Diagnosis not present

## 2020-12-01 DIAGNOSIS — G43009 Migraine without aura, not intractable, without status migrainosus: Secondary | ICD-10-CM | POA: Diagnosis not present

## 2020-12-01 NOTE — Addendum Note (Signed)
Addended by: Leida Lauth on: 12/01/2020 09:12 AM   Modules accepted: Orders

## 2020-12-01 NOTE — Patient Instructions (Signed)
  Continue topiramate Tramadol as needed Nerve study of left arm Limit use of pain relievers to no more than 2 days out of the week.  These medications include acetaminophen, NSAIDs (ibuprofen/Advil/Motrin, naproxen/Aleve, triptans (Imitrex/sumatriptan), Excedrin, and narcotics.  This will help reduce risk of rebound headaches. Be aware of common food triggers:  - Caffeine:  coffee, black tea, cola, Mt. Dew  - Chocolate  - Dairy:  aged cheeses (brie, blue, cheddar, gouda, [place], provolone, [place], Swiss, etc), chocolate milk, buttermilk, sour cream, limit eggs and yogurt  - Nuts, peanut butter  - Alcohol  - Cereals/grains:  FRESH breads (fresh bagels, sourdough, doughnuts), yeast productions  - Processed/canned/aged/cured meats (pre-packaged deli meats, hotdogs)  - MSG/glutamate:  soy sauce, flavor enhancer, pickled/preserved/marinated foods  - Sweeteners:  aspartame (Equal, Nutrasweet).  Sugar and Splenda are okay  - Vegetables:  legumes (lima beans, lentils, snow peas, fava beans, pinto peans, peas, garbanzo beans), sauerkraut, onions, olives, pickles  - Fruit:  avocados, bananas, citrus fruit (orange, lemon, grapefruit), mango  - Other:  Frozen meals, macaroni and cheese Routine exercise Stay adequately hydrated (aim for 64 oz water daily) Keep headache diary Maintain proper stress management Maintain proper sleep hygiene Do not skip meals Consider supplements:  magnesium citrate 400mg  daily, riboflavin 400mg  daily, coenzyme Q10 100mg  three times daily.

## 2020-12-01 NOTE — Telephone Encounter (Signed)
LMOM for patient to call back to get her sch for her year follow up appt with Jaffe and the Left arm EMG appt with Allena Katz.

## 2020-12-09 ENCOUNTER — Other Ambulatory Visit (HOSPITAL_BASED_OUTPATIENT_CLINIC_OR_DEPARTMENT_OTHER): Payer: Self-pay

## 2020-12-19 ENCOUNTER — Other Ambulatory Visit: Payer: Self-pay | Admitting: Neurology

## 2020-12-27 ENCOUNTER — Other Ambulatory Visit: Payer: Self-pay

## 2020-12-27 ENCOUNTER — Ambulatory Visit: Payer: Managed Care, Other (non HMO) | Admitting: Neurology

## 2020-12-27 DIAGNOSIS — M5412 Radiculopathy, cervical region: Secondary | ICD-10-CM

## 2020-12-27 NOTE — Procedures (Signed)
[facility]  [address], [address]  [place], [place] [address] Tel: [phone] Fax:  [phone] Test Date:  12/27/2020  Patient: [name] [name] DOB: [birth year] Physician: Nita Sickle, DO  Sex: Female Height: 5\' 4"  Ref Phys: , D.O.  ID#: [name]   Technician:    Patient Complaints: This is a 45 year old female referred for evaluation of left arm paresthesias  NCV & EMG Findings: Extensive electrodiagnostic testing of the left upper extremity shows:  Left median, ulnar, and mixed palmar sensory responses are within normal limits. Left median and ulnar motor responses are within normal limits Chronic motor axonal loss changes are seen in the left pronator teres and triceps muscles.  There is no evidence of accompanying active denervation.  Impression: Chronic C7 radiculopathy affecting left upper extremity, mild.  Findings are stable as compared to study on 01/17/2017. There is no evidence of right carpal tunnel syndrome, which was previously very mild.   ___________________________ 13/10/2016, DO    Nerve Conduction Studies Anti Sensory Summary Table   Stim Site NR Peak (ms) Norm Peak (ms) P-T Amp (V) Norm P-T Amp  Left Median Anti Sensory (2nd Digit)  34C  Wrist    2.5 <3.4 61.7 >20  Left Ulnar Anti Sensory (5th Digit)  34C  Wrist    2.2 <3.1 53.4 >12   Motor Summary Table   Stim Site NR Onset (ms) Norm Onset (ms) O-P Amp (mV) Norm O-P Amp Site1 Site2 Delta-0 (ms) Dist (cm) Vel (m/s) Norm Vel (m/s)  Left Median Motor (Abd Poll Brev)  34C  Wrist    2.5 <3.9 9.8 >6 Elbow Wrist 4.1 26.0 63 >50  Elbow    6.6  9.4         Left Ulnar Motor (Abd Dig Minimi)  34C  Wrist    1.9 <3.1 11.0 >7 B Elbow Wrist 2.8 19.0 68 >50  B Elbow    4.7  10.5  A Elbow B Elbow 1.6 10.0 63 >50  A Elbow    6.3  10.3          Comparison Summary Table   Stim Site NR Peak (ms) Norm Peak (ms) P-T Amp (V) Site1 Site2 Delta-P (ms) Norm Delta (ms)  Left  Median/Ulnar Palm Comparison (Wrist - 8cm)  34C  Median Palm    1.5 <2.2 134.9 Median Palm Ulnar Palm 0.1   Ulnar Palm    1.4 <2.2 36.7       EMG   Side Muscle Ins Act Fibs Psw Fasc Number Recrt Dur Dur. Amp Amp. Poly Poly. Comment  Left 1stDorInt Nml Nml Nml Nml Nml Nml Nml Nml Nml Nml Nml Nml N/A  Left PronatorTeres Nml Nml Nml Nml 1- Rapid Some 1+ Some 1+ Some 1+ N/A  Left Biceps Nml Nml Nml Nml Nml Nml Nml Nml Nml Nml Nml Nml N/A  Left Triceps Nml Nml Nml Nml 1- Rapid Some 1+ Some 1+ Some 1+ N/A  Left Deltoid Nml Nml Nml Nml Nml Nml Nml Nml Nml Nml Nml Nml N/A      Waveforms:

## 2020-12-29 ENCOUNTER — Telehealth: Payer: Self-pay

## 2020-12-29 DIAGNOSIS — M5412 Radiculopathy, cervical region: Secondary | ICD-10-CM

## 2020-12-29 NOTE — Telephone Encounter (Signed)
-----   Message from Drema Dallas, DO sent at 12/28/2020  1:32 PM EDT ----- The nerve study again showed evidence of mild irritation of a nerve in the neck (similar to what was found on previous nerve study 4 years ago).  I would recommend sending to physical therapy for left sided cervical radiculopathy

## 2020-12-29 NOTE — Telephone Encounter (Signed)
Pt advised of her EMG results. Physical therapy referral added.  Pt wanted to know if there is anything Dr.Jaffe could call in for the pain. Pt states Dr.Jaffe asked at her last visit and she declined. Per pt she is unable to deal with the pain now.

## 2020-12-30 ENCOUNTER — Other Ambulatory Visit (HOSPITAL_BASED_OUTPATIENT_CLINIC_OR_DEPARTMENT_OTHER): Payer: Self-pay

## 2020-12-30 MED ORDER — GABAPENTIN 100 MG PO CAPS
100.0000 mg | ORAL_CAPSULE | Freq: Three times a day (TID) | ORAL | 5 refills | Status: DC
  Filled 2020-12-30: qty 90, 30d supply, fill #0
  Filled 2021-02-01: qty 90, 30d supply, fill #1
  Filled 2021-03-09: qty 90, 30d supply, fill #2
  Filled 2021-04-10: qty 90, 30d supply, fill #3
  Filled 2021-05-16: qty 90, 30d supply, fill #4
  Filled 2021-06-16: qty 90, 30d supply, fill #5

## 2020-12-30 NOTE — Telephone Encounter (Signed)
Pt advised we will send Gabapentin 100mg  three times daily (quantity 90, refills 5) to her pharmacy.  Medication sent to [facility] per pt.

## 2021-01-09 ENCOUNTER — Other Ambulatory Visit (HOSPITAL_BASED_OUTPATIENT_CLINIC_OR_DEPARTMENT_OTHER): Payer: Self-pay

## 2021-01-09 ENCOUNTER — Other Ambulatory Visit: Payer: Self-pay | Admitting: Neurology

## 2021-01-09 MED ORDER — TRAMADOL-ACETAMINOPHEN 37.5-325 MG PO TABS
ORAL_TABLET | ORAL | 2 refills | Status: DC
  Filled 2021-01-09: qty 25, 4d supply, fill #0
  Filled 2021-02-07: qty 25, 4d supply, fill #1
  Filled 2021-03-09: qty 25, 4d supply, fill #2

## 2021-02-01 ENCOUNTER — Other Ambulatory Visit (HOSPITAL_BASED_OUTPATIENT_CLINIC_OR_DEPARTMENT_OTHER): Payer: Self-pay

## 2021-02-07 ENCOUNTER — Other Ambulatory Visit (HOSPITAL_BASED_OUTPATIENT_CLINIC_OR_DEPARTMENT_OTHER): Payer: Self-pay

## 2021-03-09 ENCOUNTER — Other Ambulatory Visit (HOSPITAL_BASED_OUTPATIENT_CLINIC_OR_DEPARTMENT_OTHER): Payer: Self-pay

## 2021-03-22 ENCOUNTER — Ambulatory Visit (INDEPENDENT_AMBULATORY_CARE_PROVIDER_SITE_OTHER): Payer: Managed Care, Other (non HMO)

## 2021-03-22 DIAGNOSIS — I442 Atrioventricular block, complete: Secondary | ICD-10-CM

## 2021-03-22 LAB — CUP PACEART REMOTE DEVICE CHECK
Battery Impedance: 1913 Ohm
Battery Remaining Longevity: 35 mo
Battery Voltage: 2.76 V
Brady Statistic AP VP Percent: 5 %
Brady Statistic AP VS Percent: 0 %
Brady Statistic AS VP Percent: 95 %
Brady Statistic AS VS Percent: 0 %
Date Time Interrogation Session: 20230110173050
Implantable Lead Implant Date: 20140219
Implantable Lead Implant Date: 20140219
Implantable Lead Location: 753859
Implantable Lead Location: 753860
Implantable Lead Model: 5076
Implantable Lead Model: 5076
Implantable Pulse Generator Implant Date: 20140219
Lead Channel Impedance Value: 547 Ohm
Lead Channel Impedance Value: 653 Ohm
Lead Channel Pacing Threshold Amplitude: 0.5 V
Lead Channel Pacing Threshold Amplitude: 1.125 V
Lead Channel Pacing Threshold Pulse Width: 0.4 ms
Lead Channel Pacing Threshold Pulse Width: 0.4 ms
Lead Channel Setting Pacing Amplitude: 2 V
Lead Channel Setting Pacing Amplitude: 2.5 V
Lead Channel Setting Pacing Pulse Width: 0.46 ms
Lead Channel Setting Sensing Sensitivity: 2.8 mV

## 2021-03-31 NOTE — Progress Notes (Signed)
Remote pacemaker transmission.   

## 2021-04-10 ENCOUNTER — Other Ambulatory Visit: Payer: Self-pay | Admitting: Neurology

## 2021-04-10 ENCOUNTER — Other Ambulatory Visit (HOSPITAL_BASED_OUTPATIENT_CLINIC_OR_DEPARTMENT_OTHER): Payer: Self-pay

## 2021-04-14 ENCOUNTER — Other Ambulatory Visit (HOSPITAL_BASED_OUTPATIENT_CLINIC_OR_DEPARTMENT_OTHER): Payer: Self-pay

## 2021-04-14 ENCOUNTER — Telehealth: Payer: Self-pay | Admitting: Neurology

## 2021-04-14 MED ORDER — NORETHIN ACE-ETH ESTRAD-FE 1-20 MG-MCG PO TABS
ORAL_TABLET | ORAL | 3 refills | Status: AC
  Filled 2021-04-14: qty 28, 28d supply, fill #0

## 2021-04-14 NOTE — Telephone Encounter (Signed)
Patient called stating she needs a refill for her medication tramadol.  She stated she called the pharmacy and they told her they have not received it.

## 2021-04-17 ENCOUNTER — Other Ambulatory Visit (HOSPITAL_BASED_OUTPATIENT_CLINIC_OR_DEPARTMENT_OTHER): Payer: Self-pay

## 2021-04-17 MED ORDER — TRAMADOL-ACETAMINOPHEN 37.5-325 MG PO TABS
ORAL_TABLET | ORAL | 2 refills | Status: DC
  Filled 2021-04-17: qty 25, 4d supply, fill #0
  Filled 2021-05-16: qty 25, 4d supply, fill #1
  Filled 2021-06-16: qty 25, 4d supply, fill #2

## 2021-04-17 NOTE — Telephone Encounter (Signed)
Script sent  

## 2021-05-01 ENCOUNTER — Other Ambulatory Visit (HOSPITAL_BASED_OUTPATIENT_CLINIC_OR_DEPARTMENT_OTHER): Payer: Self-pay

## 2021-05-01 MED ORDER — CARESTART COVID-19 HOME TEST VI KIT
PACK | 0 refills | Status: DC
  Filled 2021-05-01: qty 4, 4d supply, fill #0

## 2021-05-16 ENCOUNTER — Other Ambulatory Visit (HOSPITAL_BASED_OUTPATIENT_CLINIC_OR_DEPARTMENT_OTHER): Payer: Self-pay

## 2021-05-30 ENCOUNTER — Other Ambulatory Visit (HOSPITAL_BASED_OUTPATIENT_CLINIC_OR_DEPARTMENT_OTHER): Payer: Self-pay

## 2021-05-30 MED ORDER — OXYCODONE-ACETAMINOPHEN 5-325 MG PO TABS
ORAL_TABLET | ORAL | 0 refills | Status: DC
Start: 2021-05-30 — End: 2021-12-04
  Filled 2021-05-30: qty 28, 7d supply, fill #0

## 2021-06-16 ENCOUNTER — Other Ambulatory Visit (HOSPITAL_BASED_OUTPATIENT_CLINIC_OR_DEPARTMENT_OTHER): Payer: Self-pay

## 2021-07-17 ENCOUNTER — Other Ambulatory Visit: Payer: Self-pay | Admitting: Neurology

## 2021-07-17 ENCOUNTER — Other Ambulatory Visit (HOSPITAL_BASED_OUTPATIENT_CLINIC_OR_DEPARTMENT_OTHER): Payer: Self-pay

## 2021-07-17 MED ORDER — TRAMADOL-ACETAMINOPHEN 37.5-325 MG PO TABS: ORAL_TABLET | ORAL | 2 refills | Status: DC

## 2021-07-17 MED ORDER — GABAPENTIN 100 MG PO CAPS
100.0000 mg | ORAL_CAPSULE | Freq: Three times a day (TID) | ORAL | 5 refills | Status: DC
  Filled 2021-07-17: qty 90, 30d supply, fill #0

## 2021-07-18 ENCOUNTER — Other Ambulatory Visit: Payer: Self-pay | Admitting: Neurology

## 2021-07-18 ENCOUNTER — Other Ambulatory Visit (HOSPITAL_BASED_OUTPATIENT_CLINIC_OR_DEPARTMENT_OTHER): Payer: Self-pay

## 2021-07-18 MED ORDER — TRAMADOL-ACETAMINOPHEN 37.5-325 MG PO TABS
ORAL_TABLET | ORAL | 2 refills | Status: DC
  Filled 2021-07-18: qty 25, 4d supply, fill #0

## 2021-07-20 ENCOUNTER — Telehealth: Payer: Self-pay | Admitting: Neurology

## 2021-07-20 NOTE — Telephone Encounter (Signed)
1. Which medications need refilled? (List name and dosage, if known) gabapentin and tramadol ? ?2. Which pharmacy/location is medication to be sent to? (include street and city if local pharmacy) [facility] ? ?

## 2021-07-21 ENCOUNTER — Other Ambulatory Visit (HOSPITAL_BASED_OUTPATIENT_CLINIC_OR_DEPARTMENT_OTHER): Payer: Self-pay

## 2021-07-21 ENCOUNTER — Other Ambulatory Visit: Payer: Self-pay | Admitting: Neurology

## 2021-07-21 MED ORDER — TRAMADOL-ACETAMINOPHEN 37.5-325 MG PO TABS
ORAL_TABLET | ORAL | 2 refills | Status: DC
  Filled 2021-08-18: qty 25, 4d supply, fill #0
  Filled 2021-09-19: qty 25, 4d supply, fill #1
  Filled 2021-10-20: qty 25, 4d supply, fill #2

## 2021-07-21 MED ORDER — GABAPENTIN 100 MG PO CAPS
100.0000 mg | ORAL_CAPSULE | Freq: Three times a day (TID) | ORAL | 4 refills | Status: DC
  Filled 2021-07-21 (×2): qty 90, 30d supply, fill #0
  Filled 2021-08-18: qty 90, 30d supply, fill #1
  Filled 2021-09-19: qty 90, 30d supply, fill #2
  Filled 2021-10-20: qty 90, 30d supply, fill #3
  Filled 2021-11-20: qty 90, 30d supply, fill #4

## 2021-07-21 NOTE — Telephone Encounter (Signed)
Done

## 2021-08-18 ENCOUNTER — Other Ambulatory Visit (HOSPITAL_BASED_OUTPATIENT_CLINIC_OR_DEPARTMENT_OTHER): Payer: Self-pay

## 2021-08-28 ENCOUNTER — Ambulatory Visit (INDEPENDENT_AMBULATORY_CARE_PROVIDER_SITE_OTHER): Payer: Managed Care, Other (non HMO)

## 2021-08-28 DIAGNOSIS — I442 Atrioventricular block, complete: Secondary | ICD-10-CM

## 2021-08-29 ENCOUNTER — Telehealth: Payer: Self-pay | Admitting: Internal Medicine

## 2021-08-29 NOTE — Telephone Encounter (Signed)
Spoke with patient informed her that I had reviewed her transmission and that there were no episodes, her pacemaker was functioning normally patient appreciative of call back

## 2021-08-29 NOTE — Telephone Encounter (Signed)
    1. Has your device fired?   2. Is you device beeping?   3. Are you experiencing draining or swelling at device site?   4. Are you calling to see if we received your device transmission? Yes, pt also want to speak with a nurse to gp over her result, she said , she's not been feeling well lately   5. Have you passed out?     Please route to Device Clinic Pool

## 2021-08-31 LAB — CUP PACEART REMOTE DEVICE CHECK
Battery Impedance: 2065 Ohm
Battery Remaining Longevity: 33 mo
Battery Voltage: 2.75 V
Brady Statistic AP VP Percent: 4 %
Brady Statistic AP VS Percent: 0 %
Brady Statistic AS VP Percent: 96 %
Brady Statistic AS VS Percent: 0 %
Date Time Interrogation Session: 20230618154145
Implantable Lead Implant Date: 20140219
Implantable Lead Implant Date: 20140219
Implantable Lead Location: 753859
Implantable Lead Location: 753860
Implantable Lead Model: 5076
Implantable Lead Model: 5076
Implantable Pulse Generator Implant Date: 20140219
Lead Channel Impedance Value: 569 Ohm
Lead Channel Impedance Value: 664 Ohm
Lead Channel Pacing Threshold Amplitude: 0.625 V
Lead Channel Pacing Threshold Amplitude: 1.75 V
Lead Channel Pacing Threshold Pulse Width: 0.4 ms
Lead Channel Pacing Threshold Pulse Width: 0.4 ms
Lead Channel Setting Pacing Amplitude: 2 V
Lead Channel Setting Pacing Amplitude: 2.5 V
Lead Channel Setting Pacing Pulse Width: 0.46 ms
Lead Channel Setting Sensing Sensitivity: 2.8 mV

## 2021-09-15 NOTE — Progress Notes (Signed)
Remote pacemaker transmission.   

## 2021-09-19 ENCOUNTER — Other Ambulatory Visit (HOSPITAL_BASED_OUTPATIENT_CLINIC_OR_DEPARTMENT_OTHER): Payer: Self-pay

## 2021-10-20 ENCOUNTER — Other Ambulatory Visit (HOSPITAL_BASED_OUTPATIENT_CLINIC_OR_DEPARTMENT_OTHER): Payer: Self-pay

## 2021-11-20 ENCOUNTER — Other Ambulatory Visit: Payer: Self-pay | Admitting: Neurology

## 2021-11-20 ENCOUNTER — Other Ambulatory Visit (HOSPITAL_BASED_OUTPATIENT_CLINIC_OR_DEPARTMENT_OTHER): Payer: Self-pay

## 2021-11-20 MED ORDER — TRAMADOL-ACETAMINOPHEN 37.5-325 MG PO TABS
2.0000 | ORAL_TABLET | Freq: Four times a day (QID) | ORAL | 0 refills | Status: DC | PRN
  Filled 2021-11-20: qty 25, 4d supply, fill #0

## 2021-11-23 ENCOUNTER — Other Ambulatory Visit (HOSPITAL_BASED_OUTPATIENT_CLINIC_OR_DEPARTMENT_OTHER): Payer: Self-pay

## 2021-11-27 ENCOUNTER — Ambulatory Visit: Payer: Managed Care, Other (non HMO)

## 2021-11-30 NOTE — Progress Notes (Signed)
NEUROLOGY FOLLOW UP OFFICE NOTE  [name] [name] [id number]  Assessment/Plan:   1  Migraine without aura, without status migrainosus, not intractable 2  Left sided cervical radiculitis - evidence of C7   Primary concern is the left sided C7 radiculitis.  Will order CT myelogram of cervical spine (unable to get an MRI due to PPM).  Plan will be to refer to neurosurgery for consideration of epidural injection vs surgery.  Continue gabapentin for now.   Migraine prevention:  topiramate 275m QHS Migraine rescue:  Ultracet Limit use of pain relievers to no more than 2 days out of week to prevent risk of rebound or medication-overuse headache. Keep headache diary Follow up one year or sooner if needed.  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   UPDATE: Due to worsening radicular pain, repeat NCV-EMG on 12/27/2020 showed stable chronic left C7 radiculopathy.  Recommended physical therapy and was started on gabapentin 1021mTID.  It has helped.  Has not gone to physical therapy.  Stress has aggravated the arm pain.    Migraine: Some mild increased migraines related to work-related stress. Always wakes up with a dull headache.  Intensity:  Dull to moderate Duration:  1 hour Frequency:  1 day a week Current NSAIDS: None.  Contraindicated due to ulcerative colitis. Current analgesics: Ultracet Current triptans: None Current ergotamine: None Current anti-emetic: None Current muscle relaxants: None Current anti-anxiolytic: None Current sleep aide:None Current Antihypertensive medications: None Current Antidepressant medications: None Current Anticonvulsant medications: Topiramate 200 mg at  bedtime, gabapentin 1001mID (cervical radiculitis) Current anti-CGRP: none Current Vitamins/Herbal/Supplements: iron supplement Current Antihistamines/Decongestants: None Other therapy: None Hormone/birth control: Loestrin Fe   Caffeine: None Diet: Hydrates with water.  Cut out alcohol and soda.  Intermittent fasting.  Gluten-free, which has helped her ulcerative colitis symptoms and overall well-being. Exercise: Increased Depression: no Anxiety: no Other pain:  no Sleep hygiene: Does not sleep well.  6 hours of broken sleep.  Wakes up frequently.  Mind doesn't settle.     Reports worsening constant numbness in the 4th and 5th digits of the left hand and heaviness of the left arm, with left sided neck pain.  Previously saw spine specialist who recommended surgery but she did not want to go that route.     HISTORY: Onset:  Early 20s23sThey resolved but returned about March 2017..  [name] Kitchenhe has had increased depression and anxiety since experiencing cardiac arrest 4 years ago and was diagnosed with heart block.  She also was diagnosed with ulcerative colitis and has been unable to work since August. Location:  Bi-frontal, back of neck Quality:  pounding Initial intensity:  Constant 6/10 with episodes of 10/10 Aura:  no Prodrome:  no Associated symptoms: Nausea, vomiting, photophobia, phonophobia, vertigo, weakness, paresthesias Initial duration:  Constant (severe 10/10 fluctuations last 24 to 48 hours) Initial Frequency:  Daily (severe 10/10 fluctuations occur every 10 days) Triggers/aggravating factors:  Noise, light, movement Relieving factors:  Rest in quiet dark room with cool rag Activity:  Aggravated by light activity.  Cannot function at all when severe.   Past NSAIDS: Ibuprofen, naproxen Past analgesics: Midrin, Tylenol, Percocet, Fioricet, BC powder Past abortive triptans: Maxalt (cause slurred speech/difficulty thinking) Past abortive ergotamine:  none Past muscle relaxants:  none Past anti-emetic:  none Past antihypertensive medications:  none Past antidepressant medications: Nortriptyline Past anticonvulsant medications:  none Past anti-CGRP:  none Past vitamins/Herbal/Supplements:  none Past antihistamines/decongestants:  none Other past therapies:  none   Family history of headache:  father CT of head from 03/23/14 was normal.   Left-sided cervical radiculitis: In 2015, she developed Shingles on the left arm.  In June 2018, she developed a sharp burning pain along the same distribution of her Shingles, the left side of her neck down the lateral arm to the entire hand.  After a few days, the pain subsided except for the hand and now she only notes constant pins and needles sensation in the index and lateral aspect of her middle finger.  She did not develop a rash.  She has some neck discomfort.  She denies weakness.  She tried Lyrica, which was too strong.  She would rather not take a pain medication due to potential drowsiness.  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  She had an NCV/EMG of the upper extremities on 01/17/2017 which demonstrated mild left chronic C7 radiculopathy as well as right median neuropathy at or distal to the wrist.  She was referred to pain specialist.  She was prescribed gabapentin but it was ineffective.  Symptoms improved.  She notes numbness and pain in left index finger.  PAST MEDICAL HISTORY: Past Medical History:  Diagnosis Date   Abnormal Pap smear 2004   colpo   Alcohol abuse    Anxiety    AVNRT (AV nodal re-entry tachycardia) ([facility]) 04/29/12   s/p slow pathway modification   Candida vaginitis 05/2006   Complete heart block, post-surgical ([facility]) 04/30/12   s/p PPM implant   Depression    H/O candidiasis    H/O varicella 2005   IBS (irritable bowel syndrome)    Panic attack     Pelvic pain in female 2007    MEDICATIONS: Current Outpatient Medications on File Prior to Visit  Medication Sig Dispense Refill   COVID-19 At Home Antigen Test (CARESTART COVID-19 HOME TEST) KIT Use as directed per package instructions 4 each 0   gabapentin (NEURONTIN) 100 MG capsule Take 1 capsule (100 mg total) by mouth 3 (three) times daily. 90 capsule 4   norethindrone-ethinyl estradiol (LOESTRIN FE) 1-20 MG-MCG tablet TAKE 1 TABLET BY MOUTH ONCE DAILY. WILL NEED APPOINTMENT FOR FURTHER REFILLS 28 tablet 0   norethindrone-ethinyl estradiol-FE (BLISOVI FE 1/20) 1-20 MG-MCG tablet Take 1 tablet by mouth every day 84 tablet 3   oxyCODONE-acetaminophen (PERCOCET/ROXICET) 5-325 MG tablet Take 1 Tablet by mouth every 6 hours as needed for pain 56 tablet 0   topiramate (TOPAMAX) 200 MG tablet TAKE 1 TABLET AT BEDTIME 90 tablet 3   traMADol-acetaminophen (ULTRACET) 37.5-325 MG tablet Take 2 tablets by mouth every 6 (six) hours as needed for severe migraine 25 tablet 0   No current facility-administered medications on file prior to visit.    ALLERGIES: No Known Allergies  FAMILY HISTORY: Family History  Problem Relation Age of Onset   Diabetes Mother    Cancer Sister        Lump removed frfom shoulder   Heart disease Maternal Grandfather        MI   Cancer Paternal Grandmother        breast      Objective:  Blood pressure 117/77,  pulse 93, height 5' 4"  (1.626 m), weight 155 lb 9.6 oz (70.6 kg), SpO2 95 %, unknown if currently breastfeeding. General: No acute distress.  Patient appears well-groomed.   Head:  Normocephalic/atraumatic Eyes:  Fundi examined but not visualized Neck: supple, no paraspinal tenderness, full range of motion Heart:  Regular rate and rhythm Neurological Exam: alert and oriented to person, place, and time.  Speech fluent and not dysarthric, language intact.  CN II-XII intact. Bulk and tone normal, muscle strength 5/5 throughout.  Sensation to light touch  reduced in 2nd and 3rd digits of left hand.   Deep tendon reflexes 2+ throughout, toes downgoing.  Finger to nose testing intact.  Gait normal, Romberg negative.   Metta Clines, DO  CC: Isaias Cowman, PA-C

## 2021-12-04 ENCOUNTER — Encounter: Payer: Self-pay | Admitting: Neurology

## 2021-12-04 ENCOUNTER — Other Ambulatory Visit (HOSPITAL_BASED_OUTPATIENT_CLINIC_OR_DEPARTMENT_OTHER): Payer: Self-pay

## 2021-12-04 ENCOUNTER — Ambulatory Visit: Payer: Managed Care, Other (non HMO) | Admitting: Neurology

## 2021-12-04 VITALS — BP 117/77 | HR 93 | Ht 64.0 in | Wt 155.6 lb

## 2021-12-04 DIAGNOSIS — M5412 Radiculopathy, cervical region: Secondary | ICD-10-CM | POA: Diagnosis not present

## 2021-12-04 DIAGNOSIS — G43009 Migraine without aura, not intractable, without status migrainosus: Secondary | ICD-10-CM

## 2021-12-04 MED ORDER — TRAMADOL-ACETAMINOPHEN 37.5-325 MG PO TABS
2.0000 | ORAL_TABLET | Freq: Four times a day (QID) | ORAL | 2 refills | Status: DC | PRN
  Filled 2021-12-04 – 2021-12-22 (×2): qty 25, 3d supply, fill #0
  Filled 2022-01-25: qty 25, 4d supply, fill #1
  Filled 2022-02-22: qty 25, 4d supply, fill #2

## 2021-12-04 MED ORDER — GABAPENTIN 100 MG PO CAPS
100.0000 mg | ORAL_CAPSULE | Freq: Three times a day (TID) | ORAL | 5 refills | Status: DC
  Filled 2021-12-04 – 2022-01-02 (×2): qty 90, 30d supply, fill #0

## 2021-12-04 MED ORDER — TOPIRAMATE 200 MG PO TABS: 200.0000 mg | ORAL_TABLET | Freq: Every day | ORAL | 3 refills | Status: DC

## 2021-12-04 NOTE — Patient Instructions (Addendum)
Refill gabapentin, topiramate and tramadol Will send you to spine specialist.  Will see if we need to get a CT myelogram first Follow up 1 year

## 2021-12-05 ENCOUNTER — Other Ambulatory Visit: Payer: Self-pay

## 2021-12-05 DIAGNOSIS — M5412 Radiculopathy, cervical region: Secondary | ICD-10-CM

## 2021-12-05 NOTE — Progress Notes (Signed)
Per Dr.Jaffe please order a Cervical Myelogram.  Order added.

## 2021-12-06 ENCOUNTER — Other Ambulatory Visit: Payer: Self-pay | Admitting: Neurology

## 2021-12-06 DIAGNOSIS — M5412 Radiculopathy, cervical region: Secondary | ICD-10-CM

## 2021-12-13 ENCOUNTER — Other Ambulatory Visit (HOSPITAL_BASED_OUTPATIENT_CLINIC_OR_DEPARTMENT_OTHER): Payer: Self-pay

## 2021-12-18 ENCOUNTER — Ambulatory Visit (INDEPENDENT_AMBULATORY_CARE_PROVIDER_SITE_OTHER): Payer: Managed Care, Other (non HMO)

## 2021-12-18 ENCOUNTER — Other Ambulatory Visit: Payer: Managed Care, Other (non HMO)

## 2021-12-18 ENCOUNTER — Telehealth: Payer: Self-pay | Admitting: Neurology

## 2021-12-18 DIAGNOSIS — I442 Atrioventricular block, complete: Secondary | ICD-10-CM

## 2021-12-18 NOTE — Telephone Encounter (Signed)
Had peer to peer regarding CT myelogram of cervical spine.  Authorization # [id number] 

## 2021-12-18 NOTE — Telephone Encounter (Signed)
Had peer to peer regarding CT myelogram of cervical spine.  Authorization # [id number]

## 2021-12-19 LAB — CUP PACEART REMOTE DEVICE CHECK
Battery Impedance: 2162 Ohm
Battery Remaining Longevity: 31 mo
Battery Voltage: 2.75 V
Brady Statistic AP VP Percent: 8 %
Brady Statistic AP VS Percent: 0 %
Brady Statistic AS VP Percent: 92 %
Brady Statistic AS VS Percent: 0 %
Date Time Interrogation Session: 20231007075115
Implantable Lead Implant Date: 20140219
Implantable Lead Implant Date: 20140219
Implantable Lead Location: 753859
Implantable Lead Location: 753860
Implantable Lead Model: 5076
Implantable Lead Model: 5076
Implantable Pulse Generator Implant Date: 20140219
Lead Channel Impedance Value: 574 Ohm
Lead Channel Impedance Value: 616 Ohm
Lead Channel Pacing Threshold Amplitude: 0.625 V
Lead Channel Pacing Threshold Amplitude: 1.125 V
Lead Channel Pacing Threshold Pulse Width: 0.4 ms
Lead Channel Pacing Threshold Pulse Width: 0.4 ms
Lead Channel Setting Pacing Amplitude: 2 V
Lead Channel Setting Pacing Amplitude: 2.5 V
Lead Channel Setting Pacing Pulse Width: 0.46 ms
Lead Channel Setting Sensing Sensitivity: 2.8 mV

## 2021-12-20 ENCOUNTER — Ambulatory Visit
Admission: RE | Admit: 2021-12-20 | Discharge: 2021-12-20 | Disposition: A | Discharge status: Home / Self Care | Payer: Managed Care, Other (non HMO) | Source: Ambulatory Visit | Attending: Neurology | Admitting: Neurology

## 2021-12-20 DIAGNOSIS — M5412 Radiculopathy, cervical region: Secondary | ICD-10-CM

## 2021-12-20 MED ORDER — MEPERIDINE HCL 50 MG/ML IJ SOLN: 50.0000 mg | Freq: Once | INTRAMUSCULAR | Status: DC | PRN

## 2021-12-20 MED ORDER — ONDANSETRON HCL 4 MG/2ML IJ SOLN: 4.0000 mg | Freq: Once | INTRAMUSCULAR | Status: DC | PRN

## 2021-12-20 MED ORDER — DIAZEPAM 5 MG PO TABS
10.0000 mg | ORAL_TABLET | Freq: Once | ORAL | Status: AC
  Administered 2021-12-20: 10 mg via ORAL

## 2021-12-20 MED ORDER — IOPAMIDOL (ISOVUE-M 300) INJECTION 61%
10.0000 mL | Freq: Once | INTRAMUSCULAR | Status: AC
  Administered 2021-12-20: 10 mL via INTRATHECAL

## 2021-12-20 NOTE — Discharge Instructions (Signed)

## 2021-12-21 ENCOUNTER — Telehealth: Payer: Self-pay

## 2021-12-21 DIAGNOSIS — M5412 Radiculopathy, cervical region: Secondary | ICD-10-CM

## 2021-12-21 NOTE — Telephone Encounter (Signed)
Patient advised of her CT results and referral to Pain management.

## 2021-12-21 NOTE — Telephone Encounter (Signed)
-----   Message from Pieter Partridge, DO sent at 12/21/2021 10:39 AM EDT ----- The CT does show area in the neck that is likely pinching a nerve causing the left arm symptoms.  I would like to refer to pain management for consideration of an epidural injection.

## 2021-12-22 ENCOUNTER — Other Ambulatory Visit (HOSPITAL_BASED_OUTPATIENT_CLINIC_OR_DEPARTMENT_OTHER): Payer: Self-pay

## 2021-12-27 ENCOUNTER — Encounter: Payer: Self-pay | Admitting: Neurology

## 2021-12-29 NOTE — Progress Notes (Signed)
Remote pacemaker transmission.   

## 2022-01-02 ENCOUNTER — Other Ambulatory Visit (HOSPITAL_BASED_OUTPATIENT_CLINIC_OR_DEPARTMENT_OTHER): Payer: Self-pay

## 2022-01-10 ENCOUNTER — Other Ambulatory Visit (HOSPITAL_BASED_OUTPATIENT_CLINIC_OR_DEPARTMENT_OTHER): Payer: Self-pay

## 2022-01-10 MED ORDER — GABAPENTIN 300 MG PO CAPS
300.0000 mg | ORAL_CAPSULE | Freq: Three times a day (TID) | ORAL | 2 refills | Status: DC
  Filled 2022-01-10: qty 90, 30d supply, fill #0
  Filled 2022-02-15: qty 90, 30d supply, fill #1
  Filled 2022-04-27: qty 90, 30d supply, fill #2

## 2022-01-26 ENCOUNTER — Other Ambulatory Visit (HOSPITAL_BASED_OUTPATIENT_CLINIC_OR_DEPARTMENT_OTHER): Payer: Self-pay

## 2022-01-26 MED ORDER — ACETAMINOPHEN-CODEINE 120-12 MG/5ML PO SOLN
10.0000 mL | Freq: Four times a day (QID) | ORAL | 0 refills | Status: DC | PRN
  Filled 2022-01-26: qty 280, 7d supply, fill #0

## 2022-01-29 ENCOUNTER — Other Ambulatory Visit (HOSPITAL_BASED_OUTPATIENT_CLINIC_OR_DEPARTMENT_OTHER): Payer: Self-pay

## 2022-02-15 ENCOUNTER — Other Ambulatory Visit (HOSPITAL_BASED_OUTPATIENT_CLINIC_OR_DEPARTMENT_OTHER): Payer: Self-pay

## 2022-02-22 ENCOUNTER — Other Ambulatory Visit: Payer: Self-pay

## 2022-03-07 ENCOUNTER — Other Ambulatory Visit (HOSPITAL_BASED_OUTPATIENT_CLINIC_OR_DEPARTMENT_OTHER): Payer: Self-pay

## 2022-03-07 MED ORDER — PREGABALIN 75 MG PO CAPS
75.0000 mg | ORAL_CAPSULE | Freq: Two times a day (BID) | ORAL | 2 refills | Status: DC
  Filled 2022-03-07: qty 60, 30d supply, fill #0
  Filled 2022-04-05: qty 60, 30d supply, fill #1

## 2022-03-07 MED ORDER — TIZANIDINE HCL 4 MG PO TABS
4.0000 mg | ORAL_TABLET | Freq: Two times a day (BID) | ORAL | 2 refills | Status: DC | PRN
  Filled 2022-03-07: qty 60, 30d supply, fill #0
  Filled 2022-04-05: qty 60, 30d supply, fill #1
  Filled 2022-05-04: qty 60, 30d supply, fill #2

## 2022-03-26 ENCOUNTER — Other Ambulatory Visit: Payer: Self-pay | Admitting: Neurology

## 2022-03-26 ENCOUNTER — Other Ambulatory Visit (HOSPITAL_BASED_OUTPATIENT_CLINIC_OR_DEPARTMENT_OTHER): Payer: Self-pay

## 2022-03-26 MED ORDER — TRAMADOL-ACETAMINOPHEN 37.5-325 MG PO TABS
2.0000 | ORAL_TABLET | Freq: Four times a day (QID) | ORAL | 2 refills | Status: DC | PRN
  Filled 2022-03-26: qty 25, 4d supply, fill #0
  Filled 2022-04-23: qty 25, 4d supply, fill #1
  Filled 2022-05-22: qty 25, 4d supply, fill #2

## 2022-04-04 ENCOUNTER — Ambulatory Visit: Payer: Managed Care, Other (non HMO) | Attending: Cardiology

## 2022-04-04 DIAGNOSIS — I442 Atrioventricular block, complete: Secondary | ICD-10-CM | POA: Diagnosis not present

## 2022-04-05 LAB — CUP PACEART REMOTE DEVICE CHECK
Battery Impedance: 2247 Ohm
Battery Remaining Longevity: 30 mo
Battery Voltage: 2.75 V
Brady Statistic AP VP Percent: 9 %
Brady Statistic AP VS Percent: 0 %
Brady Statistic AS VP Percent: 91 %
Brady Statistic AS VS Percent: 0 %
Date Time Interrogation Session: 20240124174050
Implantable Lead Connection Status: 753985
Implantable Lead Connection Status: 753985
Implantable Lead Implant Date: 20140219
Implantable Lead Implant Date: 20140219
Implantable Lead Location: 753859
Implantable Lead Location: 753860
Implantable Lead Model: 5076
Implantable Lead Model: 5076
Implantable Pulse Generator Implant Date: 20140219
Lead Channel Impedance Value: 597 Ohm
Lead Channel Impedance Value: 659 Ohm
Lead Channel Pacing Threshold Amplitude: 0.5 V
Lead Channel Pacing Threshold Amplitude: 1.375 V
Lead Channel Pacing Threshold Pulse Width: 0.4 ms
Lead Channel Pacing Threshold Pulse Width: 0.4 ms
Lead Channel Setting Pacing Amplitude: 2 V
Lead Channel Setting Pacing Amplitude: 2.5 V
Lead Channel Setting Pacing Pulse Width: 0.46 ms
Lead Channel Setting Sensing Sensitivity: 2.8 mV
Zone Setting Status: 755011
Zone Setting Status: 755011

## 2022-04-06 ENCOUNTER — Other Ambulatory Visit: Payer: Self-pay

## 2022-04-17 ENCOUNTER — Other Ambulatory Visit (HOSPITAL_BASED_OUTPATIENT_CLINIC_OR_DEPARTMENT_OTHER): Payer: Self-pay

## 2022-04-17 MED ORDER — PREGABALIN 100 MG PO CAPS
100.0000 mg | ORAL_CAPSULE | Freq: Two times a day (BID) | ORAL | 2 refills | Status: DC
  Filled 2022-04-17: qty 60, 30d supply, fill #0
  Filled 2022-05-22: qty 60, 30d supply, fill #1
  Filled 2022-07-06: qty 60, 30d supply, fill #2

## 2022-04-23 ENCOUNTER — Other Ambulatory Visit: Payer: Self-pay

## 2022-04-25 NOTE — Progress Notes (Signed)
Remote pacemaker transmission.   

## 2022-05-22 ENCOUNTER — Other Ambulatory Visit: Payer: Self-pay

## 2022-05-28 ENCOUNTER — Emergency Department (HOSPITAL_COMMUNITY): Payer: Managed Care, Other (non HMO)

## 2022-05-28 ENCOUNTER — Emergency Department (HOSPITAL_COMMUNITY)
Admission: EM | Admit: 2022-05-28 | Discharge: 2022-05-28 | Disposition: A | Discharge status: Home / Self Care | Payer: Managed Care, Other (non HMO) | Attending: Emergency Medicine | Admitting: Emergency Medicine

## 2022-05-28 DIAGNOSIS — G43109 Migraine with aura, not intractable, without status migrainosus: Secondary | ICD-10-CM

## 2022-05-28 DIAGNOSIS — R42 Dizziness and giddiness: Secondary | ICD-10-CM | POA: Diagnosis not present

## 2022-05-28 DIAGNOSIS — Z950 Presence of cardiac pacemaker: Secondary | ICD-10-CM | POA: Diagnosis not present

## 2022-05-28 LAB — CBC
HCT: 41.9 % (ref 36.0–46.0)
Hemoglobin: 13.1 g/dL (ref 12.0–15.0)
MCH: 29.8 pg (ref 26.0–34.0)
MCHC: 31.3 g/dL (ref 30.0–36.0)
MCV: 95.2 fL (ref 80.0–100.0)
Platelets: 211 10*3/uL (ref 150–400)
RBC: 4.4 MIL/uL (ref 3.87–5.11)
RDW: 13.2 % (ref 11.5–15.5)
WBC: 10.3 10*3/uL (ref 4.0–10.5)
nRBC: 0 % (ref 0.0–0.2)

## 2022-05-28 LAB — DIFFERENTIAL
Abs Immature Granulocytes: 0.04 10*3/uL (ref 0.00–0.07)
Basophils Absolute: 0.1 10*3/uL (ref 0.0–0.1)
Basophils Relative: 1 %
Eosinophils Absolute: 0 10*3/uL (ref 0.0–0.5)
Eosinophils Relative: 0 %
Immature Granulocytes: 0 %
Lymphocytes Relative: 27 %
Lymphs Abs: 2.8 10*3/uL (ref 0.7–4.0)
Monocytes Absolute: 0.4 10*3/uL (ref 0.1–1.0)
Monocytes Relative: 4 %
Neutro Abs: 6.9 10*3/uL (ref 1.7–7.7)
Neutrophils Relative %: 68 %

## 2022-05-28 LAB — ETHANOL: Alcohol, Ethyl (B): <10 mg/dL (ref <10)

## 2022-05-28 LAB — COMPREHENSIVE METABOLIC PANEL
ALT: 14 U/L (ref 0–44)
AST: 17 U/L (ref 15–41)
Albumin: 3.6 g/dL (ref 3.5–5.0)
Alkaline Phosphatase: 28 U/L — ABNORMAL LOW (ref 38–126)
Anion gap: 6 (ref 5–15)
BUN: 9 mg/dL (ref 6–20)
CO2: 16 mmol/L — ABNORMAL LOW (ref 22–32)
Calcium: 8 mg/dL — ABNORMAL LOW (ref 8.9–10.3)
Chloride: 112 mmol/L — ABNORMAL HIGH (ref 98–111)
Creatinine, Ser: 0.8 mg/dL (ref 0.44–1.00)
GFR, Estimated: >60 mL/min (ref >60)
Glucose, Bld: 213 mg/dL — ABNORMAL HIGH (ref 70–99)
Potassium: 4.4 mmol/L (ref 3.5–5.1)
Sodium: 134 mmol/L — ABNORMAL LOW (ref 135–145)
Total Bilirubin: 0.2 mg/dL — ABNORMAL LOW (ref 0.3–1.2)
Total Protein: 5.9 g/dL — ABNORMAL LOW (ref 6.5–8.1)

## 2022-05-28 LAB — CBG MONITORING, ED: Glucose-Capillary: 200 mg/dL — ABNORMAL HIGH (ref 70–99)

## 2022-05-28 LAB — I-STAT CHEM 8, ED
BUN: 8 mg/dL (ref 6–20)
Calcium, Ion: 1.09 mmol/L — ABNORMAL LOW (ref 1.15–1.40)
Chloride: 112 mmol/L — ABNORMAL HIGH (ref 98–111)
Creatinine, Ser: 0.8 mg/dL (ref 0.44–1.00)
Glucose, Bld: 205 mg/dL — ABNORMAL HIGH (ref 70–99)
HCT: 39 % (ref 36.0–46.0)
Hemoglobin: 13.3 g/dL (ref 12.0–15.0)
Potassium: 4.5 mmol/L (ref 3.5–5.1)
Sodium: 138 mmol/L (ref 135–145)
TCO2: 15 mmol/L — ABNORMAL LOW (ref 22–32)

## 2022-05-28 LAB — PROTIME-INR
INR: 1 (ref 0.8–1.2)
Prothrombin Time: 13.4 seconds (ref 11.4–15.2)

## 2022-05-28 LAB — APTT: aPTT: 24 seconds (ref 24–36)

## 2022-05-28 LAB — I-STAT BETA HCG BLOOD, ED (MC, WL, AP ONLY): I-stat hCG, quantitative: <5 m[IU]/mL (ref <5)

## 2022-05-28 LAB — D-DIMER, QUANTITATIVE: D-Dimer, Quant: <0.27 ug/mL-FEU (ref 0.00–0.50)

## 2022-05-28 MED ORDER — MECLIZINE HCL 25 MG PO TABS
25.0000 mg | ORAL_TABLET | Freq: Once | ORAL | Status: AC
  Administered 2022-05-28: 25 mg via ORAL
  Filled 2022-05-28: qty 1

## 2022-05-28 MED ORDER — IOHEXOL 350 MG/ML SOLN
75.0000 mL | Freq: Once | INTRAVENOUS | Status: AC | PRN
  Administered 2022-05-28: 75 mL via INTRAVENOUS

## 2022-05-28 MED ORDER — SODIUM CHLORIDE 0.9% FLUSH
3.0000 mL | Freq: Once | INTRAVENOUS | Status: AC
  Administered 2022-05-28: 3 mL via INTRAVENOUS

## 2022-05-28 NOTE — Consult Note (Signed)
Neurology Consultation  Reason for Consult: Code Stroke Referring Physician: Maryan Rued  CC: Dizziness, expressive aphasia  History is obtained from: Patient  HPI: [name] [name] is a 47 y.o. female with a past medical history of migraine, anxiety, IBS, anxiety, ETOH, PPM placement in 2014 presenting with dizziness and expressive aphasia. On arrival she is tearful but able to tell us that she was at work when she developed a headache and became dizzy. She has delayed speech with word finding difficulty. Patient reports that starting Saturday she felt dizzy which she she described as being lightheaded and her equilibrium was off. She reports she really did not do anything all weekend and because of the symptoms but went to work today. She has not had any big life changes, she reports her normal amount of stress. She has not had any medication changes. While at work she stood up to do something and became very dizzy and felt like she was going to pass out. She reports it was at this time that she started having difficulty getting her words out. She reports that it is never happened before and she has never had symptoms exactly like this in the past.  She has had a pacemaker since 2014 after her ablation. She currently takes topamax and birth control pills. She follows with Dr. Tomi Likens for her migraines. She has not used her rescue medications recently.     LKW: 0900 tpa given?: No Premorbid modified Rankin scale (mRS):  0-Completely asymptomatic and back to baseline post-stroke  ROS: Full ROS was performed and is negative except as noted in the HPI.   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 Her smoking use included cigarettes. She smoked an average of .5 packs per day. She has never used smokeless tobacco. She reports that she does not drink alcohol and does not use drugs.  Medications No current facility-administered medications for this encounter.  Current Outpatient Medications:    acetaminophen-codeine 120-12 MG/5ML solution, Take 10 mLs by mouth 4 (four) times daily as needed for cough, Disp: 473 mL, Rfl: 0   gabapentin (NEURONTIN) 100 MG capsule, Take 1 capsule (100 mg total) by mouth 3 (three) times daily., Disp: 90 capsule, Rfl: 5   gabapentin (NEURONTIN) 300 MG capsule, Take 1 capsule (300 mg total) by mouth 3 (three) times daily., Disp: 90 capsule, Rfl: 2   norethindrone-ethinyl estradiol-FE (BLISOVI FE 1/20) 1-20 MG-MCG tablet, Take 1 tablet by mouth every day, Disp: 84 tablet, Rfl: 3   pregabalin (LYRICA) 100 MG capsule, Take 1 capsule (100 mg total) by mouth 2 (two) times  daily., Disp: 60 capsule, Rfl: 2   pregabalin (LYRICA) 75 MG capsule, Take 1 capsule (75 mg total) by mouth 2 (two) times daily., Disp: 60 capsule, Rfl: 2   tiZANidine (ZANAFLEX) 4 MG tablet, Take 1 tablet (4 mg total) by mouth 2 (two) times daily as needed., Disp: 60 tablet, Rfl: 2   topiramate (TOPAMAX) 200 MG tablet, Take 1 tablet (200 mg total) by mouth at bedtime., Disp: 90 tablet, Rfl: 3   traMADol-acetaminophen (ULTRACET) 37.5-325 MG tablet, Take 2 tablets by mouth every 6 (six) hours as needed for severe migraine, Disp: 25 tablet, Rfl: 2   Exam: Current vital signs: BP (!) 151/82 (BP Location: Left Arm)   Pulse 78   Resp 16   Ht 5\' 4"   (1.626 m)   Wt 71.4 kg   SpO2 99%   BMI 27.02 kg/m  Vital signs in last 24 hours: Pulse Rate:  [78] 78 (03/18 1047) Resp:  [16] 16 (03/18 1047) BP: (151)/(82) 151/82 (03/18 1047) SpO2:  [99 %] 99 % (03/18 1047) Weight:  [71.4 kg] 71.4 kg (03/18 1048)  GENERAL: Awake, alert, tearful, appears anxious HEENT: - Normocephalic and atraumatic, dry mm, no LN++, no Thyromegally LUNGS - Clear to auscultation bilaterally with no wheezes CV - S1S2 RRR, no m/r/g, equal pulses bilaterally. ABDOMEN - Soft, nontender, nondistended with normoactive BS Ext: warm, well perfused, intact peripheral pulses, no edema  NEURO:  Mental Status: AA&Ox3  Language: speech is delayed with stuttering.  Naming, repetition, fluency, and comprehension intact. Cranial Nerves: PERRL mm/brisk. EOMI, visual fields full, no facial asymmetry, facial sensation intact, hearing intact, tongue/uvula/soft palate midline, normal sternocleidomastoid and trapezius muscle strength. No evidence of tongue atrophy or fasciculations Motor: Moves all extremities antigravity and equally Tone: is normal and bulk is normal Sensation- Intact to light touch bilaterally Coordination: FTN intact bilaterally, no ataxia in BLE. Gait- deferred  NIHSS components Score: Comment  1a Level of Conscious 0[x]  1[]  2[]  3[]         1b LOC Questions 0[x]  1[]  2[]           1c LOC Commands 0[x]  1[]  2[]           2 Best Gaze 0[x]  1[]  2[]           3 Visual 0[x]  1[]  2[]  3[]         4 Facial Palsy 0[x]  1[]  2[]  3[]         5a Motor Arm - left 0[x]  1[]  2[]  3[]  4[]  UN[]     5b Motor Arm - Right 0[x]  1[]  2[]  3[]  4[]  UN[]     6a Motor Leg - Left 0[x]  1[]  2[]  3[]  4[]  UN[]     6b Motor Leg - Right 0[x]  1[]  2[]  3[]  4[]  UN[]     7 Limb Ataxia 0[x]  1[]  2[]  3[]  UN[]       8 Sensory 0[x]  1[]  2[]  UN[]         9 Best Language 0[x]  1[]  2[]  3[]         10 Dysarthria 0[]  1[x]  2[]  UN[]         11 Extinct. and Inattention 0[x]  1[]  2[]           TOTAL: 1        Labs I have  reviewed labs in epic and the results pertinent to this consultation are:  CBC    Component Value Date/Time   WBC 10.3 05/28/2022 1024   RBC 4.40 05/28/2022 1024   HGB 13.3 05/28/2022 1034   HGB 13.8 10/14/2020 1015  HCT 39.0 05/28/2022 1034   HCT 42.7 10/14/2020 1015   PLT 211 05/28/2022 1024   PLT 267 10/14/2020 1015   MCV 95.2 05/28/2022 1024   MCV 92 10/14/2020 1015   MCH 29.8 05/28/2022 1024   MCHC 31.3 05/28/2022 1024   RDW 13.2 05/28/2022 1024   RDW 13.1 10/14/2020 1015   LYMPHSABS 2.8 05/28/2022 1024   MONOABS 0.4 05/28/2022 1024   EOSABS 0.0 05/28/2022 1024   BASOSABS 0.1 05/28/2022 1024    CMP     Component Value Date/Time   NA 138 05/28/2022 1034   NA 136 10/14/2020 1015   K 4.5 05/28/2022 1034   CL 112 (H) 05/28/2022 1034   CO2 16 (L) 05/28/2022 1024   GLUCOSE 205 (H) 05/28/2022 1034   BUN 8 05/28/2022 1034   BUN 13 10/14/2020 1015   CREATININE 0.80 05/28/2022 1034   CREATININE 0.76 03/19/2014 1701   CALCIUM 8.0 (L) 05/28/2022 1024   PROT 5.9 (L) 05/28/2022 1024   ALBUMIN 3.6 05/28/2022 1024   AST 17 05/28/2022 1024   ALT 14 05/28/2022 1024   ALKPHOS 28 (L) 05/28/2022 1024   BILITOT 0.2 (L) 05/28/2022 1024   GFRNONAA >60 05/28/2022 1024   GFRAA >60 06/10/2015 2000   Imaging I have reviewed the images obtained: CT-head-no acute abnormality CT Angio head and neck -no acute abnormality  Assessment:  47 y.o. female with a past medical history of migraine, anxiety, IBS, anxiety, ETOH, PPM placement in 2014 presenting with dizziness and expressive aphasia.  He was at work when the symptoms started around 9 AM today.  She states that she stood up became dizzy and started having trouble speaking.  She does report a frontal headache.  She has never had symptoms like this in the past.  She currently takes birth control pills and Topamax.  She is not currently taking gabapentin.  She follows with Dr. Tomi Likens at [facility] neurology for her migraines.  She does  report that she has been stressed out however she states it is not abnormal for her to feel like this.  She has not had any recent medication changes or illness.  Impression: Complex migraine  Recommendations: -Symptom management-meclizine, migraine cocktail -Close neuro monitoring until she is out of the TNK window -Interrogate pacemaker -Follow up outpatient neurology - Dr. Tomi Likens   Patient seen and examined by NP/APP with MD. MD to update note as needed.   Janine Ores, DNP, FNP-BC [facility] Pager: [phone]  Attending Neurohospitalist Addendum Patient seen and examined with APP/Resident. Agree with the history and physical as documented above. Agree with the plan as documented, which I helped formulate. I have independently reviewed the chart, obtained history, review of systems and examined the patient.I have personally reviewed pertinent head/neck/spine imaging (CT/MRI). Briefly, 47 year old brought in for concern for strokelike symptoms.  Sudden onset of worsening dizziness today with symptoms that have been ongoing since Saturday which she describes as feeling unsteady on her gait.  This morning she was at work and had a sudden episode of dizziness followed by what she describes as difficulty with her words but on my examination, she was awake alert oriented x 3 and had stuttering without any obvious aphasia.  No other cranial nerve, motor or sensory deficits.  Given history of migraines, first differential was complex migraine but given an extensive cardiac history, noncontrast head CT done-unremarkable.  CT angio head and neck-no LVO. She was given some symptomatic treatment and her symptoms are already better.  At this point, I do not see a need for admission.  Symptoms likely related to complex migraine. Plan discussed with the patient and also relayed to Dr. Maryan Rued in the ER. Please feel free to call with any questions.  -- Amie Portland,  MD Neurologist [facility] Pager: [phone]

## 2022-05-28 NOTE — Discharge Instructions (Signed)
All the testing today was normal.  Return if you have issues with your speech or weakness numbness on one side of your body.  Also return for any type of chest pain, shortness of breath or passing out.

## 2022-05-28 NOTE — ED Provider Notes (Signed)
[facility] Provider Note   CSN: [id number] Arrival date & time: 05/28/22  1018  An emergency department physician performed an initial assessment on this suspected stroke patient at 1016.  History  Chief Complaint  Patient presents with   Dizziness   Aphasia    [name] [name] is a 47 y.o. female.  Patient is a 47 year old female with a history of AVNRT, complete heart block status post [employer]tr[employer] pacemaker, prior alcohol abuse but has not had any alcohol for the last 3 to 4 months who is presenting today as a code stroke.  Patient reports that all weekend she is felt dizzy which she describes as lightheaded, equilibrium off and just not herself.  Lying down and not moving does help the symptoms but eating, drinking fluids does not seem to make any difference.  She has not had any nausea, vomiting, fever, congestion, chest pain or shortness of breath.  She reports she really did not do anything all week and because of the symptoms but went to work today.  While at work she stood up to do something and became very dizzy felt like she was going to pass out and became very pale.  She reports it was at this time that she started having difficulty getting her words out.  She reports that it is never happened before and she has never had symptoms exactly like this in the past.  Currently she states things are getting better but she still not getting her words out completely.  She denies any palpitations.  No unilateral leg pain or swelling but she does take birth control pills.  No prior history of clots.  The history is provided by the patient and medical records.  Dizziness      Home Medications Prior to Admission medications   Medication Sig Start Date End Date Taking? Authorizing Provider  acetaminophen-codeine 120-12 MG/5ML solution Take 10 mLs by mouth 4 (four) times daily as needed for cough 01/26/22     gabapentin (NEURONTIN) 100 MG  capsule Take 1 capsule (100 mg total) by mouth 3 (three) times daily. 12/04/21   Pieter Partridge, DO  gabapentin (NEURONTIN) 300 MG capsule Take 1 capsule (300 mg total) by mouth 3 (three) times daily. 01/10/22     norethindrone-ethinyl estradiol-FE (BLISOVI FE 1/20) 1-20 MG-MCG tablet Take 1 tablet by mouth every day 04/14/21     pregabalin (LYRICA) 100 MG capsule Take 1 capsule (100 mg total) by mouth 2 (two) times daily. 04/17/22     pregabalin (LYRICA) 75 MG capsule Take 1 capsule (75 mg total) by mouth 2 (two) times daily. 03/07/22     tiZANidine (ZANAFLEX) 4 MG tablet Take 1 tablet (4 mg total) by mouth 2 (two) times daily as needed. 03/07/22     topiramate (TOPAMAX) 200 MG tablet Take 1 tablet (200 mg total) by mouth at bedtime. 12/04/21   Pieter Partridge, DO  traMADol-acetaminophen (ULTRACET) 37.5-325 MG tablet Take 2 tablets by mouth every 6 (six) hours as needed for severe migraine 03/26/22 09/22/22  Pieter Partridge, DO      Allergies    Patient has no known allergies.    Review of Systems   Review of Systems  Neurological:  Positive for dizziness.    Physical Exam Updated Vital Signs BP (!) 151/82 (BP Location: Left Arm)   Pulse 78   Resp 16   Ht 5\' 4"  (1.626 m)   Wt 71.4 kg  SpO2 99%   BMI 27.02 kg/m  Physical Exam Vitals and nursing note reviewed.  Constitutional:      General: She is not in acute distress.    Appearance: She is well-developed.  HENT:     Head: Normocephalic and atraumatic.  Eyes:     Conjunctiva/sclera: Conjunctivae normal.     Pupils: Pupils are equal, round, and reactive to light.  Cardiovascular:     Rate and Rhythm: Normal rate and regular rhythm.     Heart sounds: No murmur heard.    Comments: This maker present in the left upper chest Pulmonary:     Effort: Pulmonary effort is normal. No respiratory distress.     Breath sounds: Normal breath sounds. No wheezing or rales.  Abdominal:     General: There is no distension.     Palpations: Abdomen is  soft.     Tenderness: There is no abdominal tenderness. There is no guarding or rebound.  Musculoskeletal:        General: No tenderness. Normal range of motion.     Cervical back: Normal range of motion and neck supple.  Skin:    General: Skin is warm and dry.     Findings: No erythema or rash.  Neurological:     Mental Status: She is alert and oriented to person, place, and time.     Cranial Nerves: Dysarthria present. No facial asymmetry.     Sensory: Sensation is intact. No sensory deficit.     Motor: Motor function is intact. No weakness.     Coordination: Coordination normal. Finger-Nose-Finger Test and Heel to [place] Test normal.     Gait: Gait is intact.     Comments: Some difficulty getting words out  Psychiatric:        Behavior: Behavior normal.     ED Results / Procedures / Treatments   Labs (all labs ordered are listed, but only abnormal results are displayed) Labs Reviewed  COMPREHENSIVE METABOLIC PANEL - Abnormal; Notable for the following components:      Result Value   Sodium 134 (*)    Chloride 112 (*)    CO2 16 (*)    Glucose, Bld 213 (*)    Calcium 8.0 (*)    Total Protein 5.9 (*)    Alkaline Phosphatase 28 (*)    Total Bilirubin 0.2 (*)    All other components within normal limits  I-STAT CHEM 8, ED - Abnormal; Notable for the following components:   Chloride 112 (*)    Glucose, Bld 205 (*)    Calcium, Ion 1.09 (*)    TCO2 15 (*)    All other components within normal limits  CBG MONITORING, ED - Abnormal; Notable for the following components:   Glucose-Capillary 200 (*)    All other components within normal limits  PROTIME-INR  APTT  CBC  DIFFERENTIAL  ETHANOL  D-DIMER, QUANTITATIVE  I-STAT BETA HCG BLOOD, ED ([facility], [facility], [facility] ONLY)    EKG EKG Interpretation  Date/Time:  Monday May 28 2022 10:49:11 EDT Ventricular Rate:  65 PR Interval:  130 QRS Duration: 154 QT Interval:  477 QTC Calculation: 496 R Axis:   268 Text  Interpretation: VENTRICULAR PACED RHYTHM IVCD, consider atypical LBBB Confirmed by Blanchie Dessert [phone]) on 05/28/2022 11:05:57 AM  Radiology CT ANGIO HEAD NECK W WO CM (CODE STROKE)  Result Date: 05/28/2022 CLINICAL DATA:  Dizziness fatigue and aphasia. EXAM: CT ANGIOGRAPHY HEAD AND NECK TECHNIQUE: Multidetector CT imaging of the head  and neck was performed using the standard protocol during bolus administration of intravenous contrast. Multiplanar CT image reconstructions and MIPs were obtained to evaluate the vascular anatomy. Carotid stenosis measurements (when applicable) are obtained utilizing NASCET criteria, using the distal internal carotid diameter as the denominator. RADIATION DOSE REDUCTION: This exam was performed according to the departmental dose-optimization program which includes automated exposure control, adjustment of the mA and/or kV according to patient size and/or use of iterative reconstruction technique. CONTRAST:  40mL OMNIPAQUE IOHEXOL 350 MG/ML SOLN COMPARISON:  Head CT from earlier the same day FINDINGS: CTA NECK FINDINGS Aortic arch: Unremarkable.  Two vessel branching Right carotid system: Vessels are smoothly contoured and widely patent Left carotid system: Vessels are smoothly contoured and widely patent. No atheromatous changes. Vertebral arteries: No proximal subclavian or vertebral stenosis or irregularity. Both vertebral arteries are smoothly contoured and widely patent to the dura. Skeleton: No acute finding. Other neck: No acute finding Upper chest: Clear apical lungs. Partially covered pacer with generator on the left. Review of the MIP images confirms the above findings CTA HEAD FINDINGS Anterior circulation: No significant stenosis, proximal occlusion, aneurysm, or vascular malformation. Posterior circulation: No significant stenosis, proximal occlusion, aneurysm, or vascular malformation. Venous sinuses: Diffusely patent Anatomic variants: None significant Review of  the MIP images confirms the above findings IMPRESSION: Negative CTA.  No explanation for symptoms. Electronically Signed   By: Jorje Guild M.D.   On: 05/28/2022 10:41   CT HEAD CODE STROKE WO CONTRAST  Result Date: 05/28/2022 CLINICAL DATA:  Code stroke.  Aphasia with dizziness and fatigue. EXAM: CT HEAD WITHOUT CONTRAST TECHNIQUE: Contiguous axial images were obtained from the base of the skull through the vertex without intravenous contrast. RADIATION DOSE REDUCTION: This exam was performed according to the departmental dose-optimization program which includes automated exposure control, adjustment of the mA and/or kV according to patient size and/or use of iterative reconstruction technique. COMPARISON:  03/23/2014 FINDINGS: Brain: No evidence of acute infarction, hemorrhage, hydrocephalus, extra-axial collection or mass lesion/mass effect. Mineralization at the left globus pallidus, asymmetric but chronic. Vascular: No hyperdense vessel or unexpected calcification. Skull: Normal. Negative for fracture or focal lesion. Sinuses/Orbits: Negative ASPECTS ([place] Stroke Program Early CT Score) - Ganglionic level infarction (caudate, lentiform nuclei, internal capsule, insula, M1-M3 cortex): 7 - Supraganglionic infarction (M4-M6 cortex): 3 Total score (0-10 with 10 being normal): 10 IMPRESSION: 1. Negative head CT. 2. ASPECTS is 10. Electronically Signed   By: Jorje Guild M.D.   On: 05/28/2022 10:35    Procedures Procedures    Medications Ordered in ED Medications  sodium chloride flush (NS) 0.9 % injection 3 mL (3 mLs Intravenous Given 05/28/22 1050)  iohexol (OMNIPAQUE) 350 MG/ML injection 75 mL (75 mLs Intravenous Contrast Given 05/28/22 1032)  meclizine (ANTIVERT) tablet 25 mg (25 mg Oral Given 05/28/22 1142)    ED Course/ Medical Decision Making/ A&P                             Medical Decision Making Amount and/or Complexity of Data Reviewed External Data Reviewed: notes. Labs:  ordered. Decision-making details documented in ED Course. Radiology: ordered and independent interpretation performed. Decision-making details documented in ED Course. ECG/medicine tests: ordered and independent interpretation performed. Decision-making details documented in ED Course.   Pt with multiple medical problems and comorbidities and presenting today with a complaint that caries a high risk for morbidity and mortality.  Here today as a code  stroke due to sudden onset of word finding issues approximately 1 hour prior to arrival.  Patient reports having a headache but does not feel like her typical migraine.  On arrival patient is an NIH of 1 for some word finding difficulty which is improving.  Patient has a paced rhythm at this time and improving blood pressure without intervention.  No evidence of dysrhythmia at this time.  Will interrogate pacemaker to ensure no dysrhythmia earlier today causing symptoms.  Stroke team present at bedside and patient went directly to the scanner.  Independently interpreted patient's head CT which shows no evidence of bleed or lesion.  Radiology reports negative CTA and head CT.  At this time they did not feel that patient was a candidate for TNKase.  Patient is unable to get an MRI because her pacemaker is not compatible.  Patient will do a D-dimer to ensure patient is not having PE as she is a low risk [place] but does take birth control pills.  Will also ensure her dizziness is not from electrolyte abnormality.  Patient is not having significant headache to suggest migraine as a source today.  Also could be peripheral vertigo.  Will follow neurology recommendations.  1:07 PM Patient's D-dimer is negative.  Her pacemaker interrogation showed no acute events.  Neurology has reevaluated the patient at this time do not feel that she needs EEG, MRI or further testing at this time as she is now back to her baseline.  Patient ambulated here without difficulty.  At this  time she request discharge.         Final Clinical Impression(s) / ED Diagnoses Final diagnoses:  Dizziness    Rx / DC Orders ED Discharge Orders     None         Blanchie Dessert, MD 05/28/22 1307

## 2022-05-28 NOTE — ED Notes (Signed)
Got patient into a gown on the monitor did EKG shown to er provider patient is resting with call bell in reach  

## 2022-05-28 NOTE — ED Notes (Signed)
[employer]tr[employer] Report: dual chamber pacemaker, last checked 10/14/2020, since then no episodes recorded, battery is good for 30 months, measurements within expected range, R wave not measured because right ventricle lead is pacing greater than 80% of the time, overall device is functioning as programmed.

## 2022-05-28 NOTE — ED Triage Notes (Addendum)
Pt BIB EMS from work after c/o fatigue since Saturday but started "feeling off" this morning. LKW 0900. CBG w/ EMS 69. Pt started acting confused when asked questions by EMS and stuttering when giving answers. Pt Aox4. Hx of complete heart block w/ pacemaker insertion in 2014

## 2022-05-28 NOTE — Code Documentation (Addendum)
Stroke Response Nurse Documentation Code Documentation  [name] [name] is a 47 y.o. female arriving to [facility]  via [facility] on 3/18 with past medical hx of SVT, complete heart block, migraine, and insomnia. On No antithrombotic. Code stroke was activated by EMS.   Patient from work where she was LKW at 0900 and now complaining of confusion, dizziness, and dysarthria. She endorses having intermittent dizziness and generalized weakness since Saturday but tried to go to work today. Her coworkers noticed at work that she wasn't acting herself, has a mild headache, and was stuttering, so they called EMS for her.  Stroke team at the bedside on patient arrival. Labs drawn and patient cleared for CT by Dr. Maryan Rued. Patient to CT with team. NIHSS 1, see documentation for details and code stroke times. Patient with dysarthria  on exam. The following imaging was completed:  CT Head and CTA. Patient is not a candidate for IV Thrombolytic due to no stroke suspected per MD. Patient is not not a candidate for IR due to no LVO on imaging per MD.   Care Plan: q30 neuro checks until 1330, no MRI d/t incompatible pacemaker, which will be interrogated.   Bedside handoff with ED RN Vikki Ports.    [place]  Stroke Response RN

## 2022-05-30 ENCOUNTER — Telehealth: Payer: Self-pay | Admitting: Student

## 2022-05-30 ENCOUNTER — Other Ambulatory Visit (HOSPITAL_BASED_OUTPATIENT_CLINIC_OR_DEPARTMENT_OTHER): Payer: Self-pay

## 2022-05-30 MED ORDER — MECLIZINE HCL 25 MG PO TABS
25.0000 mg | ORAL_TABLET | ORAL | 0 refills | Status: DC
  Filled 2022-05-30: qty 45, 15d supply, fill #0

## 2022-05-30 NOTE — Telephone Encounter (Signed)
   Pre-operative Risk Assessment    Patient Name: [name] [name]  DOB: 07/13/1975 MRN: [id number]      Request for Surgical Clearance    Procedure:   Radiofrequency Ablation after Block/Injection selective nerve/nerve root diagnostic/therapeutic - left c6/7   Date of Surgery:  Clearance 06/11/22                                 Surgeon:  Dr. Edmonia James  Surgeon's Group or Practice Name:  [facility]  Phone number:  [phone] Fax number:  [phone]    Type of Clearance Requested:   - Medical    Type of Anesthesia:  None    Additional requests/questions:   [place], PT NEEDS TO BE CLEARED FOR DEVICE   Signed, Foye Clock   05/30/2022, 1:06 PM

## 2022-05-30 NOTE — Telephone Encounter (Signed)
   Name: [name] [name]  DOB: 24-Aug-1975  MRN: [id number]  Primary Cardiologist: None  Chart reviewed as part of pre-operative protocol coverage. Because of [name]'s past medical history and time since last visit, she will require a follow-up in-office visit in order to better assess preoperative cardiovascular risk. Patient will need device clearance as well, per request.   Pre-op covering staff: - Please schedule appointment and call patient to inform them. If patient already had an upcoming appointment within acceptable timeframe, please add "pre-op clearance" to the appointment notes so provider is aware. - Please contact requesting surgeon's office via preferred method (i.e, phone, fax) to inform them of need for appointment prior to surgery.     Mayra Reel, NP  05/30/2022, 1:33 PM

## 2022-05-31 NOTE — Telephone Encounter (Signed)
Pt is scheduled to see Tommye Standard, NP, 06/04/22, clearance will be addressed at that time.  Will route to requesting surgeon's office.

## 2022-05-31 NOTE — Telephone Encounter (Signed)
I did not see appt yet scheduled for the pt. I sent a message to [place] to see if she could please reach out to the pt and schedule in office appt for pre op clearance. Pt surgery is planned for 06/11/22.

## 2022-06-03 NOTE — Progress Notes (Unsigned)
Cardiology Office Note Date:  06/03/2022  Patient ID:  [name], [name] 1975/11/05, MRN [id number] PCP:  Secundino Ginger, PA-C  Electrophysiologist: Dr. Rayann Heman   ***refresh   Chief Complaint: *** pre-op  History of Present Illness: [name] [name] is a 47 y.o. female with history of anxiety/panic, SVT (s/p AVNRT ablation 2014), PPM, postural dizziness.  Last saw Dr. Rayann Heman 2017  Saw A. Seiler 2018, 2021  A. Tillery, PA-C 10/14/20, mild dizziness when standing quickly, no other symptoms, pacer functioning normally, doing intermittent remotes.  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   s/p slow pathway modification   Candida vaginitis 05/2006   Complete heart block, post-surgical ([place]) 04/30/12   s/p PPM implant   Depression    H/O candidiasis    H/O varicella 2005   IBS (irritable bowel syndrome)    Panic attack    Pelvic pain in female 2007    Past Surgical History:  Procedure Laterality Date   CHOLECYSTECTOMY     EP study and ablation  04/29/12   slow pathway ablation by Dr Rayann Heman    PACEMAKER INSERTION  04/30/12   Medtronic Adapta L implanted by Dr Lovena Le for complete heart block   PERMANENT PACEMAKER INSERTION N/A 04/30/2012   Procedure: PERMANENT PACEMAKER INSERTION;  Surgeon: Evans Lance, MD;  Location: [facility] C[facility] LAB;  Service: Cardiovascular;  Laterality: N/A;   SUPRAVENTRICULAR TACHYCARDIA ABLATION N/A 04/29/2012   Procedure: SUPRAVENTRICULAR TACHYCARDIA ABLATION;  Surgeon: Thompson Grayer, MD;  Location: [facility] CATH LAB;  Service: Cardiovascular;  Laterality: N/A;    TONSILLECTOMY  1995    Current Outpatient Medications  Medication Sig Dispense Refill   acetaminophen-codeine 120-12 MG/5ML solution Take 10 mLs by mouth 4 (four) times daily as needed for cough 473 mL 0   gabapentin (NEURONTIN) 100 MG capsule Take 1 capsule (100 mg total) by mouth 3 (three) times daily. 90 capsule 5   gabapentin (NEURONTIN) 300 MG capsule Take 1 capsule (300 mg total) by mouth 3 (three) times daily. 90 capsule 2   meclizine (ANTIVERT) 25 MG tablet Take 1 tablet (25 mg total) by mouth 2 to 3 times a day as needed for dizziness 45 tablet 0   norethindrone-ethinyl estradiol-FE (BLISOVI FE 1/20) 1-20 MG-MCG tablet Take 1 tablet by mouth every day 84 tablet 3   pregabalin (LYRICA) 100 MG capsule Take 1 capsule (100 mg total) by mouth 2 (two) times daily. 60 capsule 2   pregabalin (LYRICA) 75 MG capsule Take 1 capsule (75 mg total) by mouth 2 (two) times daily. 60 capsule 2   tiZANidine (ZANAFLEX) 4 MG tablet Take 1 tablet (4 mg total) by mouth 2 (two) times daily as needed. 60 tablet 2   topiramate (TOPAMAX) 200 MG tablet Take 1 tablet (200 mg total) by mouth at bedtime. 90 tablet 3   traMADol-acetaminophen (ULTRACET) 37.5-325 MG tablet Take 2 tablets by mouth every 6 (six) hours as needed for severe migraine 25 tablet 2   No current facility-administered medications for this visit.    Allergies:   Patient has  no known allergies.   Social History:  The patient  reports that she has quit smoking. Her smoking use included cigarettes. She smoked an average of .5 packs per day. She has never used smokeless tobacco. She reports that she does not drink alcohol and does not use drugs.   Family History:  The patient's family history includes Cancer in her paternal grandmother and sister; Diabetes in her mother; Heart disease in her maternal grandfather.  ROS:  Please see the history of present illness.    All other systems are reviewed and otherwise negative.   PHYSICAL EXAM:   VS:  There were no vitals taken for this visit. BMI: There is no height or weight on file to calculate BMI. Well nourished, well developed, in no acute distress HEENT: normocephalic, atraumatic Neck: no JVD, carotid bruits or masses Cardiac:  *** RRR; no significant murmurs, no rubs, or gallops Lungs:  *** CTA b/l, no wheezing, rhonchi or rales Abd: soft, nontender MS: no deformity or *** atrophy Ext: *** no edema Skin: warm and dry, no rash Neuro:  No gross deficits appreciated Psych: euthymic mood, full affect  *** PPM site is stable, no tethering or discomfort   EKG:  Done today and reviewed by myself shows  ***  Device interrogation done today and reviewed by myself:  ***  Recent Labs: 05/28/2022: ALT 14; BUN 8; Creatinine, Ser 0.80; Hemoglobin 13.3; Platelets 211; Potassium 4.5; Sodium 138  No results found for requested labs within last 365 days.   Estimated Creatinine Clearance: 85.2 mL/min (by C-G formula based on SCr of 0.8 mg/dL).   Wt Readings from Last 3 Encounters:  05/28/22 157 lb 6.5 oz (71.4 kg)  12/04/21 155 lb 9.6 oz (70.6 kg)  10/14/20 165 lb 3.2 oz (74.9 kg)     Other studies reviewed: Additional studies/records reviewed today include: summarized above  ASSESSMENT AND PLAN:  PPM ***  SVT Ablated ***  Disposition: F/u with ***  Current medicines are reviewed at length with the patient today.  The patient did not have any concerns regarding medicines.  Venetia Night, PA-C 06/03/2022 9:33 AM     [place]  [place] [phone] (office)  [phone] (fax)

## 2022-06-04 ENCOUNTER — Ambulatory Visit: Payer: Managed Care, Other (non HMO) | Attending: Physician Assistant | Admitting: Physician Assistant

## 2022-06-04 ENCOUNTER — Encounter: Payer: Self-pay | Admitting: Physician Assistant

## 2022-06-04 VITALS — BP 111/84 | HR 72 | Ht 64.0 in | Wt 152.8 lb

## 2022-06-04 DIAGNOSIS — R42 Dizziness and giddiness: Secondary | ICD-10-CM | POA: Diagnosis not present

## 2022-06-04 DIAGNOSIS — Z01818 Encounter for other preprocedural examination: Secondary | ICD-10-CM

## 2022-06-04 DIAGNOSIS — Z950 Presence of cardiac pacemaker: Secondary | ICD-10-CM

## 2022-06-04 DIAGNOSIS — I442 Atrioventricular block, complete: Secondary | ICD-10-CM

## 2022-06-04 LAB — CUP PACEART INCLINIC DEVICE CHECK
Battery Impedance: 2323 Ohm
Battery Remaining Longevity: 29 mo
Battery Voltage: 2.75 V
Brady Statistic AP VP Percent: 10 %
Brady Statistic AP VS Percent: 0 %
Brady Statistic AS VP Percent: 90 %
Brady Statistic AS VS Percent: 0 %
Date Time Interrogation Session: 20240325130300
Implantable Lead Connection Status: 753985
Implantable Lead Connection Status: 753985
Implantable Lead Implant Date: 20140219
Implantable Lead Implant Date: 20140219
Implantable Lead Location: 753859
Implantable Lead Location: 753860
Implantable Lead Model: 5076
Implantable Lead Model: 5076
Implantable Pulse Generator Implant Date: 20140219
Lead Channel Impedance Value: 627 Ohm
Lead Channel Impedance Value: 648 Ohm
Lead Channel Pacing Threshold Amplitude: 0.5 V
Lead Channel Pacing Threshold Amplitude: 0.5 V
Lead Channel Pacing Threshold Amplitude: 1 V
Lead Channel Pacing Threshold Amplitude: 1.25 V
Lead Channel Pacing Threshold Pulse Width: 0.4 ms
Lead Channel Pacing Threshold Pulse Width: 0.4 ms
Lead Channel Pacing Threshold Pulse Width: 0.4 ms
Lead Channel Pacing Threshold Pulse Width: 1 ms
Lead Channel Sensing Intrinsic Amplitude: 4 mV
Lead Channel Setting Pacing Amplitude: 2 V
Lead Channel Setting Pacing Amplitude: 2.5 V
Lead Channel Setting Pacing Pulse Width: 1 ms
Lead Channel Setting Sensing Sensitivity: 2.8 mV
Zone Setting Status: 755011
Zone Setting Status: 755011

## 2022-06-04 NOTE — Patient Instructions (Signed)
Medication Instructions:   Your physician recommends that you continue on your current medications as directed. Please refer to the Current Medication list given to you today.   *If you need a refill on your cardiac medications before your next appointment, please call your pharmacy*   Lab Work: NONE ORDERED  TODAY    If you have labs (blood work) drawn today and your tests are completely normal, you will receive your results only by: MyChart Message (if you have MyChart) OR A paper copy in the mail If you have any lab test that is abnormal or we need to change your treatment, we will call you to review the results.   Testing/Procedures: NONE ORDERED  TODAY    Follow-Up: At [facility], you and your health needs are our priority.  As part of our continuing mission to provide you with exceptional heart care, we have created designated Provider Care Teams.  These Care Teams include your primary Cardiologist (physician) and Advanced Practice Providers (APPs -  Physician Assistants and Nurse Practitioners) who all work together to provide you with the care you need, when you need it.  We recommend signing up for the patient portal called "MyChart".  Sign up information is provided on this After Visit Summary.  MyChart is used to connect with patients for Virtual Visits (Telemedicine).  Patients are able to view lab/test results, encounter notes, upcoming appointments, etc.  Non-urgent messages can be sent to your provider as well.   To learn more about what you can do with MyChart, go to [email or web address].    Your next appointment:   6 month(s)  Provider:   You may see Dr. Camnitz  or one of the following Advanced Practice Providers on your designated Care Team:   Renee Ursuy, PA-C   Other Instructions  

## 2022-06-06 ENCOUNTER — Other Ambulatory Visit (HOSPITAL_BASED_OUTPATIENT_CLINIC_OR_DEPARTMENT_OTHER): Payer: Self-pay

## 2022-06-06 MED ORDER — TIZANIDINE HCL 4 MG PO TABS
4.0000 mg | ORAL_TABLET | Freq: Two times a day (BID) | ORAL | 2 refills | Status: DC | PRN
  Filled 2022-06-06: qty 60, 30d supply, fill #0
  Filled 2022-07-06: qty 60, 30d supply, fill #1
  Filled 2022-08-15: qty 60, 30d supply, fill #2

## 2022-06-06 MED ORDER — TRAZODONE HCL 50 MG PO TABS
50.0000 mg | ORAL_TABLET | Freq: Every day | ORAL | 0 refills | Status: DC
  Filled 2022-06-06: qty 30, 30d supply, fill #0

## 2022-06-22 ENCOUNTER — Other Ambulatory Visit (HOSPITAL_BASED_OUTPATIENT_CLINIC_OR_DEPARTMENT_OTHER): Payer: Self-pay

## 2022-06-22 ENCOUNTER — Other Ambulatory Visit: Payer: Self-pay | Admitting: Neurology

## 2022-06-22 MED ORDER — TRAMADOL-ACETAMINOPHEN 37.5-325 MG PO TABS
2.0000 | ORAL_TABLET | Freq: Four times a day (QID) | ORAL | 0 refills | Status: DC | PRN
  Filled 2022-06-22: qty 25, 4d supply, fill #0

## 2022-06-28 ENCOUNTER — Encounter: Payer: Self-pay | Admitting: Neurology

## 2022-07-04 ENCOUNTER — Ambulatory Visit: Payer: Managed Care, Other (non HMO)

## 2022-07-06 ENCOUNTER — Other Ambulatory Visit: Payer: Self-pay

## 2022-07-12 ENCOUNTER — Other Ambulatory Visit (HOSPITAL_BASED_OUTPATIENT_CLINIC_OR_DEPARTMENT_OTHER): Payer: Self-pay

## 2022-07-23 ENCOUNTER — Other Ambulatory Visit (HOSPITAL_BASED_OUTPATIENT_CLINIC_OR_DEPARTMENT_OTHER): Payer: Self-pay

## 2022-07-23 MED ORDER — TRAZODONE HCL 50 MG PO TABS
50.0000 mg | ORAL_TABLET | Freq: Every day | ORAL | 0 refills | Status: DC
  Filled 2022-07-23: qty 30, 30d supply, fill #0

## 2022-07-24 ENCOUNTER — Other Ambulatory Visit: Payer: Self-pay | Admitting: Neurology

## 2022-07-24 ENCOUNTER — Other Ambulatory Visit (HOSPITAL_BASED_OUTPATIENT_CLINIC_OR_DEPARTMENT_OTHER): Payer: Self-pay

## 2022-07-24 MED ORDER — TRAMADOL-ACETAMINOPHEN 37.5-325 MG PO TABS
2.0000 | ORAL_TABLET | Freq: Four times a day (QID) | ORAL | 2 refills | Status: DC | PRN
  Filled 2022-07-24: qty 25, 3d supply, fill #0
  Filled 2022-08-21 (×2): qty 25, 3d supply, fill #1
  Filled 2022-09-27: qty 25, 3d supply, fill #2

## 2022-08-15 ENCOUNTER — Other Ambulatory Visit (HOSPITAL_BASED_OUTPATIENT_CLINIC_OR_DEPARTMENT_OTHER): Payer: Self-pay

## 2022-08-16 ENCOUNTER — Other Ambulatory Visit: Payer: Self-pay

## 2022-08-21 ENCOUNTER — Other Ambulatory Visit (HOSPITAL_BASED_OUTPATIENT_CLINIC_OR_DEPARTMENT_OTHER): Payer: Self-pay

## 2022-09-27 ENCOUNTER — Other Ambulatory Visit: Payer: Self-pay

## 2022-10-03 ENCOUNTER — Ambulatory Visit: Payer: Managed Care, Other (non HMO)

## 2022-10-23 ENCOUNTER — Other Ambulatory Visit (HOSPITAL_BASED_OUTPATIENT_CLINIC_OR_DEPARTMENT_OTHER): Payer: Self-pay

## 2022-10-23 MED ORDER — TIZANIDINE HCL 4 MG PO TABS
4.0000 mg | ORAL_TABLET | Freq: Two times a day (BID) | ORAL | 2 refills | Status: DC | PRN
  Filled 2022-10-23: qty 60, 30d supply, fill #0
  Filled 2022-11-20: qty 60, 30d supply, fill #1
  Filled 2022-12-20: qty 60, 30d supply, fill #2

## 2022-10-23 MED ORDER — PREGABALIN 100 MG PO CAPS
100.0000 mg | ORAL_CAPSULE | Freq: Two times a day (BID) | ORAL | 2 refills | Status: DC
  Filled 2022-10-23: qty 60, 30d supply, fill #0
  Filled 2022-11-20: qty 60, 30d supply, fill #1

## 2022-10-26 ENCOUNTER — Other Ambulatory Visit: Payer: Self-pay | Admitting: Neurology

## 2022-10-26 ENCOUNTER — Other Ambulatory Visit (HOSPITAL_BASED_OUTPATIENT_CLINIC_OR_DEPARTMENT_OTHER): Payer: Self-pay

## 2022-10-26 MED ORDER — TRAMADOL-ACETAMINOPHEN 37.5-325 MG PO TABS
2.0000 | ORAL_TABLET | Freq: Four times a day (QID) | ORAL | 1 refills | Status: DC | PRN
  Filled 2022-10-26: qty 25, 3d supply, fill #0
  Filled 2022-11-23: qty 25, 4d supply, fill #1

## 2022-11-20 ENCOUNTER — Other Ambulatory Visit: Payer: Self-pay

## 2022-11-21 ENCOUNTER — Other Ambulatory Visit: Payer: Self-pay

## 2022-11-21 ENCOUNTER — Other Ambulatory Visit (HOSPITAL_BASED_OUTPATIENT_CLINIC_OR_DEPARTMENT_OTHER): Payer: Self-pay

## 2022-11-21 MED ORDER — PREGABALIN 150 MG PO CAPS
150.0000 mg | ORAL_CAPSULE | Freq: Two times a day (BID) | ORAL | 2 refills | Status: DC
  Filled 2022-11-21 – 2022-11-22 (×2): qty 60, 30d supply, fill #0
  Filled 2022-12-20: qty 60, 30d supply, fill #1
  Filled 2023-01-18: qty 60, 30d supply, fill #2

## 2022-11-22 ENCOUNTER — Other Ambulatory Visit (HOSPITAL_BASED_OUTPATIENT_CLINIC_OR_DEPARTMENT_OTHER): Payer: Self-pay

## 2022-11-25 ENCOUNTER — Other Ambulatory Visit (HOSPITAL_BASED_OUTPATIENT_CLINIC_OR_DEPARTMENT_OTHER): Payer: Self-pay

## 2022-11-26 ENCOUNTER — Other Ambulatory Visit (HOSPITAL_BASED_OUTPATIENT_CLINIC_OR_DEPARTMENT_OTHER): Payer: Self-pay

## 2022-11-26 ENCOUNTER — Encounter: Payer: Managed Care, Other (non HMO) | Admitting: Cardiology

## 2022-11-26 NOTE — Progress Notes (Unsigned)
NEUROLOGY FOLLOW UP OFFICE NOTE  [name] [name] [id number]  Assessment/Plan:   1  Migraine without aura, without status migrainosus, not intractable 2  Left sided cervical radiculitis - evidence of C7   Migraine prevention:  topiramate 200mg  QHS Migraine rescue:  Ultracet Left C7 radiculitis - under care of pain management Limit use of pain relievers to no more than 2 days out of week to prevent risk of rebound or medication-overuse headache. Keep headache diary Follow up one year or sooner if needed.  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   UPDATE: Left cervical radiculopathy: Switched from gabapentin to Lyrica.  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  She was referred to pain management .  Initial epidural injection effective but had a second epidural injection which failed.  Discussing with pain management about referral to surgery.    Migraine: Complicated by her radicular pain as well as increased emotional stress.   Always wakes up with a dull headache.  Intensity:  Dull to moderate Duration:  1 hour Frequency:  5 times a month   Of note: She was seen in the ED on 05/28/2022 for dizziness described as lightheadedness and equilibrium off, improved if lying down and not moving.  She stood up and became pale and very dizzy and felt like she was going to pass out.  Pacemaker interrogation did not reveal abnormalities.  CTA head and neck personally reviewed was negative  for LVO or significant stenosis.   Current NSAIDS: None.  Contraindicated due to ulcerative colitis. Current analgesics: Ultracet Current triptans: None Current ergotamine: None Current anti-emetic: None Current muscle relaxants: tizanidine 4mg  BID PRN Current anti-anxiolytic: None Current sleep aide:None Current Antihypertensive medications: None Current Antidepressant medications: None Current Anticonvulsant medications: Topiramate 200 mg at bedtime, Lyrica 150mg  twice daily Current anti-CGRP: none Current Vitamins/Herbal/Supplements: iron supplement Current Antihistamines/Decongestants: None Other therapy: None Hormone/birth control: Loestrin Fe   Caffeine: None Diet: Hydrates with water.  Cut out alcohol and soda.  Intermittent fasting.  Gluten-free, which has helped her ulcerative colitis symptoms and overall well-being. Exercise: Increased Depression: yes Anxiety: yes.  As above Other pain:  Chronic left C7 radiculitis Sleep hygiene: Does not sleep well.  6 hours of broken sleep.  Wakes up frequently.  Mind doesn't settle.     Reports worsening constant numbness in the 4th and 5th digits of the left hand and heaviness of the left arm, with left sided neck pain.  Previously saw spine specialist who recommended surgery but she did not want to go that route.     HISTORY: Onset:  Early 35s.  They resolved but returned about March 2017.Marland Kitchen  She has had increased depression and anxiety since experiencing cardiac arrest 4 years ago and was diagnosed with heart block.  She also was diagnosed with ulcerative colitis and has been unable to work since August. Location:  Bi-frontal, back of neck Quality:  pounding Initial intensity:  Constant 6/10 with episodes of 10/10 Aura:  no Prodrome:  no Associated symptoms: Nausea, vomiting, photophobia, phonophobia, vertigo, weakness, paresthesias Initial duration:  Constant (severe 10/10 fluctuations  last 24 to 48 hours) Initial Frequency:   Daily (severe 10/10 fluctuations occur every 10 days) Triggers/aggravating factors:  Noise, light, movement Relieving factors:  Rest in quiet dark room with cool rag Activity:  Aggravated by light activity.  Cannot function at all when severe.   Past NSAIDS: Ibuprofen, naproxen Past analgesics: Midrin, Tylenol, Percocet, Fioricet, BC powder Past abortive triptans: Maxalt (cause slurred speech/difficulty thinking) Past abortive ergotamine:  none Past muscle relaxants:  none Past anti-emetic:  none Past antihypertensive medications:  none Past antidepressant medications: Nortriptyline Past anticonvulsant medications:  gabapentin (drowsiness) Past anti-CGRP:  none Past vitamins/Herbal/Supplements:  none Past antihistamines/decongestants:  none Other past therapies:  none   Family history of headache:  father CT of head from 03/23/14 was normal.   Left-sided cervical radiculitis: In 2015, she developed Shingles on the left arm.  In June 2018, she developed a sharp burning pain along the same distribution of her Shingles, the left side of her neck down the lateral arm to the entire hand.  After a few days, the pain subsided except for the hand and now she only notes constant pins and needles sensation in the index and lateral aspect of her middle finger.  She did not develop a rash.  She has some neck discomfort.  She denies weakness.  She tried Lyrica, which was too strong.  She would rather not take a pain medication due to potential drowsiness.  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  She had an NCV/EMG of the upper extremities on 01/17/2017 which demonstrated mild left chronic C7 radiculopathy as well as right median neuropathy at or distal to the wrist.  She was referred to pain specialist.  Symptoms improved.  She notes numbness and pain in  left index finger.  Due to worsening radicular pain, repeat NCV-EMG on 12/27/2020 showed stable chronic left C7 radiculopathy  PAST MEDICAL HISTORY: Past Medical History:  Diagnosis Date   Abnormal Pap smear 2004   colpo   Alcohol abuse    Anxiety    AVNRT (AV nodal re-entry tachycardia) 04/29/12   s/p slow pathway modification   Candida vaginitis 05/2006   Complete heart block, post-surgical ([facility]) 04/30/12   s/p PPM implant   Depression    H/O candidiasis    H/O varicella 2005   IBS (irritable bowel syndrome)    Panic attack    Pelvic pain in female 2007    MEDICATIONS: Current Outpatient Medications on File Prior to Visit  Medication Sig Dispense Refill   meclizine (ANTIVERT) 25 MG tablet Take 1 tablet (25 mg total) by mouth 2 to 3 times a day as needed for dizziness 45 tablet 0   norethindrone-ethinyl estradiol-FE (BLISOVI FE 1/20) 1-20 MG-MCG tablet Take 1 tablet by mouth every day 84 tablet 3   pregabalin (LYRICA) 100 MG capsule Take 1 capsule (100 mg total) by mouth 2 (two) times daily. 60 capsule 2   pregabalin (LYRICA) 150 MG capsule Take 1 capsule (150 mg total) by mouth 2 (two) times a day. 60 capsule 2   tiZANidine (ZANAFLEX) 4 MG tablet Take 1 tablet (4 mg total) by mouth 2 (two) times daily as needed. 60 tablet 2   tiZANidine (ZANAFLEX) 4 MG tablet Take 1 tablet (4 mg total) by mouth 2 (two) times daily as needed for muscle spasms. 60 tablet 2   topiramate (TOPAMAX) 200 MG  tablet Take 1 tablet (200 mg total) by mouth at bedtime. 90 tablet 3   traMADol-acetaminophen (ULTRACET) 37.5-325 MG tablet Take 2 tablets by mouth every 6 (six) hours as needed for severe migraine. 25 tablet 1   traZODone (DESYREL) 50 MG tablet Take 1 tablet (50 mg total) by mouth at bedtime. 30 tablet 0   No current facility-administered medications on file prior to visit.    ALLERGIES: No Known Allergies  FAMILY HISTORY: Family History  Problem Relation Age of Onset   Diabetes Mother     Cancer Sister        Lump removed frfom shoulder   Heart disease Maternal Grandfather        MI   Cancer Paternal Grandmother        breast      Objective:  Blood pressure 125/65, pulse 67, height 5\' 4"  (1.626 m), weight 154 lb 6.4 oz (70 kg), SpO2 100%, unknown if currently breastfeeding. General: No acute distress.  Patient appears well-groomed.   Head:  Normocephalic/atraumatic Eyes:  Fundi examined but not visualized Neck: supple, no paraspinal tenderness, full range of motion Heart:  Regular rate and rhythm Neurological Exam: alert and oriented.  Speech fluent and not dysarthric, language intact.  CN II-XII intact. Bulk and tone normal, muscle strength 5/5 throughout.  Sensation to light touch reduced in 2nd and 3rd digits of left hand.   Deep tendon reflexes 2+ throughout, toes downgoing.  Finger to nose testing intact.  Gait normal, Romberg negative.   [name], DO  CC: Roxanne Mins, PA-C

## 2022-11-28 ENCOUNTER — Other Ambulatory Visit (HOSPITAL_BASED_OUTPATIENT_CLINIC_OR_DEPARTMENT_OTHER): Payer: Self-pay

## 2022-11-28 ENCOUNTER — Ambulatory Visit: Payer: Managed Care, Other (non HMO) | Admitting: Neurology

## 2022-11-28 ENCOUNTER — Encounter: Payer: Self-pay | Admitting: Neurology

## 2022-11-28 VITALS — BP 125/65 | HR 67 | Ht 64.0 in | Wt 154.4 lb

## 2022-11-28 DIAGNOSIS — M5412 Radiculopathy, cervical region: Secondary | ICD-10-CM | POA: Diagnosis not present

## 2022-11-28 DIAGNOSIS — G43009 Migraine without aura, not intractable, without status migrainosus: Secondary | ICD-10-CM | POA: Diagnosis not present

## 2022-11-28 MED ORDER — TOPIRAMATE 200 MG PO TABS: 200.0000 mg | ORAL_TABLET | Freq: Every day | ORAL | 3 refills | Status: DC

## 2022-11-28 MED ORDER — TRAMADOL-ACETAMINOPHEN 37.5-325 MG PO TABS
2.0000 | ORAL_TABLET | Freq: Four times a day (QID) | ORAL | 2 refills | Status: DC | PRN
  Filled 2022-11-28: qty 25, 3d supply, fill #0
  Filled 2022-12-24: qty 25, 4d supply, fill #0
  Filled 2023-01-22: qty 25, 4d supply, fill #1
  Filled 2023-02-21: qty 25, 4d supply, fill #2

## 2022-11-28 NOTE — Patient Instructions (Signed)
Refilled topiramate and tramadol

## 2022-12-05 ENCOUNTER — Ambulatory Visit: Payer: Managed Care, Other (non HMO) | Admitting: Neurology

## 2022-12-18 NOTE — Progress Notes (Unsigned)
Cardiology Office Note Date:  12/18/2022  Patient ID:  [name], [name] 15-Feb-1976, MRN [id number] PCP:  Coralee Rud, PA-C  Electrophysiologist: Dr. Johney Frame      Chief Complaint:  *** 6 mo  History of Present Illness: [name] [name] is a 47 y.o. female with history of anxiety/panic, SVT (s/p AVNRT ablation 2014), PPM, postural dizziness.  Last saw Dr. Johney Frame 2017  Saw A. Seiler 2018, 2021  A. Tillery, PA-C 10/14/20, mild dizziness when standing quickly, no other symptoms, pacer functioning normally, doing intermittent remotes.  05/28/22, ER visit with transient word findings difficulty, evaluated by neurology, CT/CTa OK, symptoms resolved, pacer interrogation reportedly without arrhythmia. No MRI done 2/2 her PPM, neuro re-evaluated with no additional testing recommended  Needs pre-op evaluation for Radiofrequency Ablation after Block/Injection selective nerve/nerve root diagnostic/therapeutic - left c6/7   RCRI score is zero, 0.4%  I saw her 06/04/22 She continues to feel a constant sense of feeling a little lightheaded all of the time. She has seen her PMD since the ER visit She add to the story when she went to the ER, EMS found her BS 60 and she was given "sugar water" on the way to the ER.   She notes that automatic cuffs tend to get a lower BP reading then manual checks She describes the lightheadedness as feeling a bit "off" maybe, a little unsteady feeling, she does have some sinus congestion, though nothing of any significance, also with some fullness of her L ear. She has not had any recurrent symptoms like she did the day she went to the ER. She feels more dizzy perhaps off balance upon standing, has to wait a few seconds to feel steady. No syncope No CP, no palpitations No SOB No exertional intolerances or difficulties with her ADLs Describes her self as quite active, with good energy. 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   s/p PPM implant   Depression    H/O candidiasis    H/O varicella 2005   IBS (irritable bowel syndrome)    Panic attack    Pelvic pain in female 2007    Past Surgical History:  Procedure Laterality Date   CHOLECYSTECTOMY     EP study and ablation  04/29/12   slow pathway ablation by Dr Johney Frame    PACEMAKER INSERTION  04/30/12   Medtronic Adapta L implanted by Dr Ladona Ridgel for complete heart block   PERMANENT PACEMAKER INSERTION N/A 04/30/2012   Procedure: PERMANENT PACEMAKER INSERTION;  Surgeon: Marinus Maw, MD;  Location[facility] C[facility] LAB;  Service: Cardiovascular;  Laterality: N/A;   SUPRAVENTRICULAR TACHYCARDIA ABLATION N/A 04/29/2012   Procedure: SUPRAVENTRICULAR TACHYCARDIA ABLATION;  Surgeon: Hillis Range, MD;  Location[facility] C[facility] LAB;  Service: Cardiovascular;  Laterality: N/A;   TONSILLECTOMY  1995    Current Outpatient Medications  Medication Sig Dispense Refill   norethindrone-ethinyl estradiol-FE (BLISOVI FE 1/20) 1-20  MG-MCG tablet Take 1 tablet by mouth every day 84 tablet 3   pregabalin (LYRICA) 150 MG capsule Take 1 capsule (150 mg total) by mouth 2 (two) times a day. 60 capsule 2   tiZANidine (ZANAFLEX) 4 MG tablet Take 1 tablet (4 mg total) by mouth 2 (two) times daily as needed. 60 tablet 2   tiZANidine (ZANAFLEX) 4 MG tablet Take 1 tablet (4 mg total) by mouth 2 (two) times daily as needed for muscle spasms. 60 tablet 2    topiramate (TOPAMAX) 200 MG tablet Take 1 tablet (200 mg total) by mouth at bedtime. 90 tablet 3   traMADol-acetaminophen (ULTRACET) 37.5-325 MG tablet Take 2 tablets by mouth every 6 (six) hours as needed for severe migraine. 25 tablet 2   No current facility-administered medications for this visit.    Allergies:   Patient has no known allergies.   Social History:  The patient  reports that she has quit smoking. Her smoking use included cigarettes. She has never used smokeless tobacco. She reports that she does not drink alcohol and does not use drugs.   Family History:  The patient's family history includes Cancer in her paternal grandmother and sister; Diabetes in her mother; Heart disease in her maternal grandfather.  ROS:  Please see the history of present illness.    All other systems are reviewed and otherwise negative.   PHYSICAL EXAM:  VS:  There were no vitals taken for this visit. BMI: There is no height or weight on file to calculate BMI. Well nourished, well developed, in no acute distress HEENT: normocephalic, atraumatic Neck: no JVD, carotid bruits or masses Cardiac: ***  RRR; no significant murmurs, no rubs, or gallops Lungs: *** CTA b/l, no wheezing, rhonchi or rales Abd: soft, nontender MS: no deformity or atrophy Ext: *** no edema Skin: warm and dry, no rash Neuro:  No gross deficits appreciated Psych: euthymic mood, full affect  *** PPM site is stable, no tethering or discomfort   EKG:  not done today  Device interrogation done today and reviewed by myself:  Battery and lead measurements are good ***   Recent Labs: 05/28/2022: ALT 14; BUN 8; Creatinine, Ser 0.80; Hemoglobin 13.3; Platelets 211; Potassium 4.5; Sodium 138  No results found for requested labs within last 365 days.   CrCl cannot be calculated (Patient's most recent lab result is older than the maximum 21 days allowed.).   Wt Readings from Last 3 Encounters:  11/28/22 154 lb 6.4 oz (70 kg)   06/04/22 152 lb 12.8 oz (69.3 kg)  05/28/22 157 lb 6.5 oz (71.4 kg)     Other studies reviewed: Additional studies/records reviewed today include: summarized above  ASSESSMENT AND PLAN:  PPM *** Intact function  *** no programming changes made  SVT Ablated *** No arrhythmias  3. Dizziness (?) *** She has the persistent sense of feeling a little "off" here with good BP and HR       Disposition: *** F/u with remotes as usual, back in clinic in 6 mo, sooner if needed   Current medicines are reviewed at length with the patient today.  The patient did not have any concerns regarding medicines.  Norma Fredrickson, PA-C 12/18/2022 7:28 AM     [facility] [address] [place] [address] [phone] (office)  [phone] (fax)

## 2022-12-19 ENCOUNTER — Encounter: Payer: Self-pay | Admitting: Physician Assistant

## 2022-12-19 ENCOUNTER — Ambulatory Visit: Payer: Managed Care, Other (non HMO) | Attending: Cardiology | Admitting: Physician Assistant

## 2022-12-19 VITALS — BP 132/89 | HR 66 | Ht 64.0 in | Wt 159.6 lb

## 2022-12-19 DIAGNOSIS — I471 Supraventricular tachycardia, unspecified: Secondary | ICD-10-CM

## 2022-12-19 DIAGNOSIS — Z950 Presence of cardiac pacemaker: Secondary | ICD-10-CM

## 2022-12-19 LAB — CUP PACEART INCLINIC DEVICE CHECK
Battery Impedance: 2525 Ohm
Battery Remaining Longevity: 22 mo
Battery Voltage: 2.73 V
Brady Statistic AP VP Percent: 20 %
Brady Statistic AP VS Percent: 0 %
Brady Statistic AS VP Percent: 80 %
Brady Statistic AS VS Percent: 0 %
Date Time Interrogation Session: 20241009172947
Implantable Lead Connection Status: 753985
Implantable Lead Connection Status: 753985
Implantable Lead Implant Date: 20140219
Implantable Lead Implant Date: 20140219
Implantable Lead Location: 753859
Implantable Lead Location: 753860
Implantable Lead Model: 5076
Implantable Lead Model: 5076
Implantable Pulse Generator Implant Date: 20140219
Lead Channel Impedance Value: 617 Ohm
Lead Channel Impedance Value: 675 Ohm
Lead Channel Pacing Threshold Amplitude: 0.5 V
Lead Channel Pacing Threshold Amplitude: 0.5 V
Lead Channel Pacing Threshold Amplitude: 1 V
Lead Channel Pacing Threshold Amplitude: 1.5 V
Lead Channel Pacing Threshold Pulse Width: 0.4 ms
Lead Channel Pacing Threshold Pulse Width: 0.4 ms
Lead Channel Pacing Threshold Pulse Width: 0.4 ms
Lead Channel Pacing Threshold Pulse Width: 1 ms
Lead Channel Sensing Intrinsic Amplitude: 4 mV
Lead Channel Setting Pacing Amplitude: 2 V
Lead Channel Setting Pacing Amplitude: 2.5 V
Lead Channel Setting Pacing Pulse Width: 1 ms
Lead Channel Setting Sensing Sensitivity: 2.8 mV
Zone Setting Status: 755011
Zone Setting Status: 755011

## 2022-12-19 NOTE — Patient Instructions (Signed)
Medication Instructions:   Your physician recommends that you continue on your current medications as directed. Please refer to the Current Medication list given to you today.  *If you need a refill on your cardiac medications before your next appointment, please call your pharmacy*   Lab Work:  NONE ORDERED  TODAY   If you have labs (blood work) drawn today and your tests are completely normal, you will receive your results only by: MyChart Message (if you have MyChart) OR A paper copy in the mail If you have any lab test that is abnormal or we need to change your treatment, we will call you to review the results.   Testing/Procedures: NONE ORDERED  TODAY    Follow-Up: At [facility], you and your health needs are our priority.  As part of our continuing mission to provide you with exceptional heart care, we have created designated Provider Care Teams.  These Care Teams include your primary Cardiologist (physician) and Advanced Practice Providers (APPs -  Physician Assistants and Nurse Practitioners) who all work together to provide you with the care you need, when you need it.  We recommend signing up for the patient portal called "MyChart".  Sign up information is provided on this After Visit Summary.  MyChart is used to connect with patients for Virtual Visits (Telemedicine).  Patients are able to view lab/test results, encounter notes, upcoming appointments, etc.  Non-urgent messages can be sent to your provider as well.   To learn more about what you can do with MyChart, go to [email or web address].    Your next appointment:   1 year(s)  Provider:   Will Camnitz, MD    Other Instructions  

## 2022-12-20 ENCOUNTER — Telehealth: Payer: Self-pay

## 2022-12-20 ENCOUNTER — Other Ambulatory Visit: Payer: Self-pay

## 2022-12-20 NOTE — Telephone Encounter (Signed)
-----   Message from Sheilah Pigeon sent at 12/19/2022  5:16 PM EDT ----- She reports she is sending her transmissions, can someone reach out to her/trouble shoot, doesn't look like we are getting them  Thanks renee

## 2022-12-20 NOTE — Telephone Encounter (Signed)
Added remote for 10/11. Called and LM w/ pt on mobile to make her aware.

## 2022-12-21 ENCOUNTER — Ambulatory Visit (INDEPENDENT_AMBULATORY_CARE_PROVIDER_SITE_OTHER): Payer: Managed Care, Other (non HMO)

## 2022-12-21 DIAGNOSIS — I442 Atrioventricular block, complete: Secondary | ICD-10-CM | POA: Diagnosis not present

## 2022-12-21 NOTE — Telephone Encounter (Signed)
Pt reports she has not been sending remotes but will send one today after she is finish at work and the dates were given to patient. Pt states she will send them on schedule.

## 2022-12-24 ENCOUNTER — Other Ambulatory Visit (HOSPITAL_BASED_OUTPATIENT_CLINIC_OR_DEPARTMENT_OTHER): Payer: Self-pay

## 2022-12-24 ENCOUNTER — Other Ambulatory Visit: Payer: Self-pay

## 2022-12-31 NOTE — Progress Notes (Signed)
Remote pacemaker transmission.   

## 2023-01-02 ENCOUNTER — Ambulatory Visit: Payer: Managed Care, Other (non HMO)

## 2023-01-08 ENCOUNTER — Other Ambulatory Visit (HOSPITAL_BASED_OUTPATIENT_CLINIC_OR_DEPARTMENT_OTHER): Payer: Self-pay

## 2023-01-08 ENCOUNTER — Other Ambulatory Visit: Payer: Self-pay

## 2023-01-16 ENCOUNTER — Other Ambulatory Visit (HOSPITAL_BASED_OUTPATIENT_CLINIC_OR_DEPARTMENT_OTHER): Payer: Self-pay

## 2023-01-16 MED ORDER — AZITHROMYCIN 250 MG PO TABS
ORAL_TABLET | ORAL | 0 refills | Status: AC
  Filled 2023-01-16: qty 6, 5d supply, fill #0

## 2023-01-16 MED ORDER — GUAIFENESIN-CODEINE 100-10 MG/5ML PO SOLN
10.0000 mL | ORAL | 0 refills | Status: AC | PRN
  Filled 2023-01-16: qty 200, 4d supply, fill #0

## 2023-01-16 MED ORDER — OSELTAMIVIR PHOSPHATE 75 MG PO CAPS
75.0000 mg | ORAL_CAPSULE | Freq: Two times a day (BID) | ORAL | 0 refills | Status: AC
  Filled 2023-01-16: qty 10, 5d supply, fill #0

## 2023-01-18 ENCOUNTER — Other Ambulatory Visit: Payer: Self-pay

## 2023-01-18 ENCOUNTER — Other Ambulatory Visit (HOSPITAL_BASED_OUTPATIENT_CLINIC_OR_DEPARTMENT_OTHER): Payer: Self-pay

## 2023-01-21 ENCOUNTER — Other Ambulatory Visit (HOSPITAL_BASED_OUTPATIENT_CLINIC_OR_DEPARTMENT_OTHER): Payer: Self-pay

## 2023-01-21 MED ORDER — TIZANIDINE HCL 4 MG PO TABS
4.0000 mg | ORAL_TABLET | Freq: Two times a day (BID) | ORAL | 2 refills | Status: DC | PRN
  Filled 2023-01-21: qty 60, 30d supply, fill #0
  Filled 2023-02-21: qty 60, 30d supply, fill #1
  Filled 2023-03-31: qty 60, 30d supply, fill #2

## 2023-01-30 ENCOUNTER — Other Ambulatory Visit (HOSPITAL_BASED_OUTPATIENT_CLINIC_OR_DEPARTMENT_OTHER): Payer: Self-pay

## 2023-01-30 MED ORDER — TRAZODONE HCL 50 MG PO TABS
50.0000 mg | ORAL_TABLET | Freq: Every day | ORAL | 1 refills | Status: AC
  Filled 2023-01-30 (×2): qty 60, 30d supply, fill #0

## 2023-02-06 ENCOUNTER — Other Ambulatory Visit (HOSPITAL_BASED_OUTPATIENT_CLINIC_OR_DEPARTMENT_OTHER): Payer: Self-pay

## 2023-02-06 MED ORDER — ERGOCALCIFEROL 1.25 MG (50000 UT) PO CAPS
50000.0000 [IU] | ORAL_CAPSULE | ORAL | 3 refills | Status: AC
  Filled 2023-02-06: qty 4, 28d supply, fill #0
  Filled 2023-03-11: qty 4, 28d supply, fill #1
  Filled 2023-04-16: qty 4, 28d supply, fill #2
  Filled 2023-05-14: qty 4, 28d supply, fill #3

## 2023-02-21 ENCOUNTER — Other Ambulatory Visit (HOSPITAL_BASED_OUTPATIENT_CLINIC_OR_DEPARTMENT_OTHER): Payer: Self-pay

## 2023-02-21 ENCOUNTER — Other Ambulatory Visit: Payer: Self-pay

## 2023-02-21 MED ORDER — PREGABALIN 150 MG PO CAPS
150.0000 mg | ORAL_CAPSULE | Freq: Two times a day (BID) | ORAL | 2 refills | Status: DC
  Filled 2023-02-21: qty 60, 30d supply, fill #0

## 2023-02-22 ENCOUNTER — Other Ambulatory Visit (HOSPITAL_BASED_OUTPATIENT_CLINIC_OR_DEPARTMENT_OTHER): Payer: Self-pay

## 2023-02-22 ENCOUNTER — Other Ambulatory Visit (HOSPITAL_COMMUNITY): Payer: Self-pay

## 2023-02-22 MED ORDER — ACETAMINOPHEN-CODEINE 120-12 MG/5ML PO SOLN
10.0000 mL | Freq: Four times a day (QID) | ORAL | 0 refills | Status: AC
  Filled 2023-02-22: qty 280, 7d supply, fill #0

## 2023-02-22 MED ORDER — AZITHROMYCIN 250 MG PO TABS
ORAL_TABLET | ORAL | 0 refills | Status: AC
  Filled 2023-02-22: qty 6, 5d supply, fill #0

## 2023-02-27 ENCOUNTER — Other Ambulatory Visit (HOSPITAL_BASED_OUTPATIENT_CLINIC_OR_DEPARTMENT_OTHER): Payer: Self-pay

## 2023-02-27 MED ORDER — ALBUTEROL SULFATE HFA 108 (90 BASE) MCG/ACT IN AERS
2.0000 | INHALATION_SPRAY | Freq: Two times a day (BID) | RESPIRATORY_TRACT | 0 refills | Status: DC
  Filled 2023-02-27: qty 6.7, 20d supply, fill #0

## 2023-02-27 MED ORDER — TRAZODONE HCL 50 MG PO TABS
50.0000 mg | ORAL_TABLET | Freq: Every day | ORAL | 1 refills | Status: AC
  Filled 2023-02-27: qty 60, 30d supply, fill #0
  Filled 2023-03-31: qty 60, 30d supply, fill #1

## 2023-02-27 MED ORDER — HYDROXYZINE HCL 10 MG PO TABS
10.0000 mg | ORAL_TABLET | Freq: Every day | ORAL | 0 refills | Status: DC | PRN
  Filled 2023-02-27: qty 30, 30d supply, fill #0

## 2023-03-15 ENCOUNTER — Other Ambulatory Visit: Payer: Self-pay

## 2023-03-15 ENCOUNTER — Other Ambulatory Visit (HOSPITAL_BASED_OUTPATIENT_CLINIC_OR_DEPARTMENT_OTHER): Payer: Self-pay

## 2023-03-15 MED ORDER — CELECOXIB 100 MG PO CAPS
100.0000 mg | ORAL_CAPSULE | Freq: Two times a day (BID) | ORAL | 0 refills | Status: DC | PRN
  Filled 2023-03-15: qty 60, 30d supply, fill #0

## 2023-03-15 MED ORDER — PREGABALIN 150 MG PO CAPS
150.0000 mg | ORAL_CAPSULE | Freq: Three times a day (TID) | ORAL | 3 refills | Status: DC
  Filled 2023-03-15: qty 90, 30d supply, fill #0
  Filled 2023-05-02: qty 90, 30d supply, fill #1

## 2023-03-29 ENCOUNTER — Other Ambulatory Visit: Payer: Self-pay | Admitting: Neurology

## 2023-04-01 ENCOUNTER — Other Ambulatory Visit: Payer: Self-pay

## 2023-04-02 ENCOUNTER — Other Ambulatory Visit (HOSPITAL_BASED_OUTPATIENT_CLINIC_OR_DEPARTMENT_OTHER): Payer: Self-pay

## 2023-04-02 MED ORDER — TRAMADOL-ACETAMINOPHEN 37.5-325 MG PO TABS
2.0000 | ORAL_TABLET | Freq: Four times a day (QID) | ORAL | 2 refills | Status: DC | PRN
  Filled 2023-04-02: qty 25, 3d supply, fill #0
  Filled 2023-05-02: qty 25, 3d supply, fill #1
  Filled 2023-06-05: qty 25, 4d supply, fill #2

## 2023-04-03 ENCOUNTER — Ambulatory Visit: Payer: Managed Care, Other (non HMO)

## 2023-04-11 ENCOUNTER — Telehealth: Payer: Self-pay

## 2023-04-11 NOTE — Telephone Encounter (Signed)
Spoke with patient and she states that she just saw Renee on 12/19/22 and was instructed to return in 1 yr. She would like to know if she still indeed needs to schedule a telehealth visit. Please advise.   Thanks,  Chelsa Stout

## 2023-04-11 NOTE — Telephone Encounter (Signed)
   Name: [name] [name]  DOB: 1975/10/15  MRN: [id number]  Primary Cardiologist: None   Preoperative team, please contact this patient and set up a phone call appointment for further preoperative risk assessment. Please obtain consent and complete medication review. Thank you for your help.  I confirm that guidance regarding antiplatelet and oral anticoagulation therapy has been completed and, if necessary, noted below.  None Requested   I also confirmed the patient resides in the state of [place]. As per [facility] Medical Board telemedicine laws, the patient must reside in the state in which the provider is licensed.   Ronney Asters, NP 04/11/2023, 3:53 PM [facility]

## 2023-04-11 NOTE — Telephone Encounter (Signed)
..     Pre-operative Risk Assessment    Patient Name: [name] [name]  DOB: [birth year] MRN: [id number]   Date of last office visit: 12/19/22 Date of next office visit: none   Request for Surgical Clearance    Procedure:   anterior cervical discectomy and fusion  Date of Surgery:  Clearance TBD                                Surgeon:  dr Mariea Clonts Surgeon's Group or Practice Name:  [facility] Phone number:  unk Fax number:  [phone]   Type of Clearance Requested:   - Medical    Type of Anesthesia:  General    Additional requests/questions:    Jola Babinski   04/11/2023, 3:20 PM

## 2023-04-15 NOTE — Telephone Encounter (Signed)
Spoke with pt.  She was confused and thought she was supposed to get a MRI 1st and they were checking on her device to see if it is compatible or not.  Confirmed with EP that her Device isn't MRI compatible.  Per pt, she wants to think about this and she will call us back if she decided to pursue with the procedure.  Will route to the requesting surgeon's office to make them aware.

## 2023-04-16 ENCOUNTER — Other Ambulatory Visit (HOSPITAL_BASED_OUTPATIENT_CLINIC_OR_DEPARTMENT_OTHER): Payer: Self-pay

## 2023-04-30 ENCOUNTER — Other Ambulatory Visit (HOSPITAL_BASED_OUTPATIENT_CLINIC_OR_DEPARTMENT_OTHER): Payer: Self-pay

## 2023-04-30 MED ORDER — HYDROXYZINE HCL 10 MG PO TABS
10.0000 mg | ORAL_TABLET | Freq: Every day | ORAL | 0 refills | Status: DC | PRN
  Filled 2023-04-30: qty 30, 30d supply, fill #0

## 2023-04-30 MED ORDER — TRAZODONE HCL 50 MG PO TABS
50.0000 mg | ORAL_TABLET | Freq: Every day | ORAL | 1 refills | Status: DC
Start: 2023-04-30 — End: 2023-07-09
  Filled 2023-04-30: qty 60, 30d supply, fill #0
  Filled 2023-06-10: qty 60, 30d supply, fill #1

## 2023-04-30 MED ORDER — ALBUTEROL SULFATE HFA 108 (90 BASE) MCG/ACT IN AERS
2.0000 | INHALATION_SPRAY | Freq: Two times a day (BID) | RESPIRATORY_TRACT | 0 refills | Status: AC
  Filled 2023-04-30: qty 6.7, 20d supply, fill #0

## 2023-05-02 ENCOUNTER — Other Ambulatory Visit (HOSPITAL_BASED_OUTPATIENT_CLINIC_OR_DEPARTMENT_OTHER): Payer: Self-pay

## 2023-05-02 ENCOUNTER — Other Ambulatory Visit: Payer: Self-pay

## 2023-05-02 MED ORDER — TIZANIDINE HCL 4 MG PO TABS
4.0000 mg | ORAL_TABLET | Freq: Two times a day (BID) | ORAL | 2 refills | Status: AC | PRN
  Filled 2023-05-02: qty 60, 30d supply, fill #0
  Filled 2023-06-20: qty 60, 30d supply, fill #1
  Filled 2023-12-05: qty 60, 30d supply, fill #2

## 2023-05-03 ENCOUNTER — Other Ambulatory Visit (HOSPITAL_BASED_OUTPATIENT_CLINIC_OR_DEPARTMENT_OTHER): Payer: Self-pay

## 2023-05-03 MED ORDER — CELECOXIB 100 MG PO CAPS
100.0000 mg | ORAL_CAPSULE | Freq: Two times a day (BID) | ORAL | 0 refills | Status: DC | PRN
Start: 2023-05-03 — End: 2023-06-20
  Filled 2023-05-03: qty 60, 30d supply, fill #0

## 2023-05-09 ENCOUNTER — Other Ambulatory Visit (HOSPITAL_BASED_OUTPATIENT_CLINIC_OR_DEPARTMENT_OTHER): Payer: Self-pay

## 2023-05-09 MED ORDER — MECLIZINE HCL 12.5 MG PO TABS
12.5000 mg | ORAL_TABLET | Freq: Two times a day (BID) | ORAL | 0 refills | Status: DC | PRN
  Filled 2023-05-09: qty 60, 30d supply, fill #0

## 2023-05-09 MED ORDER — HYDROXYZINE HCL 10 MG PO TABS
10.0000 mg | ORAL_TABLET | Freq: Two times a day (BID) | ORAL | 0 refills | Status: DC | PRN
  Filled 2023-05-09 – 2023-05-31 (×2): qty 60, 30d supply, fill #0

## 2023-05-31 ENCOUNTER — Other Ambulatory Visit (HOSPITAL_BASED_OUTPATIENT_CLINIC_OR_DEPARTMENT_OTHER): Payer: Self-pay

## 2023-05-31 ENCOUNTER — Other Ambulatory Visit: Payer: Self-pay | Admitting: Obstetrics and Gynecology

## 2023-05-31 ENCOUNTER — Other Ambulatory Visit: Payer: Self-pay

## 2023-05-31 DIAGNOSIS — Z1231 Encounter for screening mammogram for malignant neoplasm of breast: Secondary | ICD-10-CM

## 2023-06-05 ENCOUNTER — Other Ambulatory Visit (HOSPITAL_BASED_OUTPATIENT_CLINIC_OR_DEPARTMENT_OTHER): Payer: Self-pay

## 2023-06-05 ENCOUNTER — Other Ambulatory Visit: Payer: Self-pay

## 2023-06-10 ENCOUNTER — Other Ambulatory Visit (HOSPITAL_BASED_OUTPATIENT_CLINIC_OR_DEPARTMENT_OTHER): Payer: Self-pay

## 2023-06-11 ENCOUNTER — Other Ambulatory Visit (HOSPITAL_BASED_OUTPATIENT_CLINIC_OR_DEPARTMENT_OTHER): Payer: Self-pay

## 2023-06-11 MED ORDER — MECLIZINE HCL 12.5 MG PO TABS
12.5000 mg | ORAL_TABLET | Freq: Two times a day (BID) | ORAL | 2 refills | Status: AC | PRN
  Filled 2023-06-11: qty 60, 30d supply, fill #0
  Filled 2023-08-06: qty 60, 30d supply, fill #1
  Filled 2023-09-11: qty 60, 30d supply, fill #2

## 2023-06-11 MED ORDER — TRAZODONE HCL 50 MG PO TABS
50.0000 mg | ORAL_TABLET | Freq: Every day | ORAL | 1 refills | Status: AC
Start: 2023-06-11 — End: ?
  Filled 2023-06-11: qty 60, 30d supply, fill #0

## 2023-06-11 MED ORDER — HYDROXYZINE HCL 10 MG PO TABS
10.0000 mg | ORAL_TABLET | Freq: Two times a day (BID) | ORAL | 0 refills | Status: AC | PRN
  Filled 2023-06-11: qty 60, 30d supply, fill #0

## 2023-06-13 ENCOUNTER — Other Ambulatory Visit (HOSPITAL_BASED_OUTPATIENT_CLINIC_OR_DEPARTMENT_OTHER): Payer: Self-pay

## 2023-06-13 ENCOUNTER — Other Ambulatory Visit: Payer: Self-pay

## 2023-06-13 MED ORDER — PREGABALIN 225 MG PO CAPS
225.0000 mg | ORAL_CAPSULE | Freq: Two times a day (BID) | ORAL | 0 refills | Status: DC
Start: 2023-06-13 — End: 2023-07-19
  Filled 2023-06-13: qty 180, 90d supply, fill #0

## 2023-06-20 ENCOUNTER — Other Ambulatory Visit (HOSPITAL_BASED_OUTPATIENT_CLINIC_OR_DEPARTMENT_OTHER): Payer: Self-pay

## 2023-06-20 MED ORDER — CELECOXIB 100 MG PO CAPS
100.0000 mg | ORAL_CAPSULE | Freq: Two times a day (BID) | ORAL | 0 refills | Status: AC | PRN
  Filled 2023-06-20: qty 60, 30d supply, fill #0

## 2023-07-03 ENCOUNTER — Ambulatory Visit: Payer: Managed Care, Other (non HMO)

## 2023-07-09 ENCOUNTER — Other Ambulatory Visit (HOSPITAL_BASED_OUTPATIENT_CLINIC_OR_DEPARTMENT_OTHER): Payer: Self-pay

## 2023-07-09 ENCOUNTER — Other Ambulatory Visit: Payer: Self-pay | Admitting: Neurology

## 2023-07-09 MED ORDER — TRAZODONE HCL 50 MG PO TABS
50.0000 mg | ORAL_TABLET | Freq: Every day | ORAL | 1 refills | Status: DC
  Filled 2023-07-09: qty 60, 30d supply, fill #0
  Filled 2023-08-13: qty 60, 30d supply, fill #1

## 2023-07-10 ENCOUNTER — Ambulatory Visit (INDEPENDENT_AMBULATORY_CARE_PROVIDER_SITE_OTHER)

## 2023-07-10 ENCOUNTER — Other Ambulatory Visit (HOSPITAL_BASED_OUTPATIENT_CLINIC_OR_DEPARTMENT_OTHER): Payer: Self-pay

## 2023-07-10 DIAGNOSIS — I442 Atrioventricular block, complete: Secondary | ICD-10-CM

## 2023-07-10 MED ORDER — TRAMADOL-ACETAMINOPHEN 37.5-325 MG PO TABS
2.0000 | ORAL_TABLET | Freq: Four times a day (QID) | ORAL | 2 refills | Status: DC | PRN
  Filled 2023-07-10: qty 25, 4d supply, fill #0
  Filled 2023-08-12: qty 25, 4d supply, fill #1
  Filled 2023-09-11: qty 25, 4d supply, fill #2

## 2023-07-11 LAB — CUP PACEART REMOTE DEVICE CHECK
Battery Impedance: 3039 Ohm
Battery Remaining Longevity: 17 mo
Battery Voltage: 2.72 V
Brady Statistic AP VP Percent: 12 %
Brady Statistic AP VS Percent: 0 %
Brady Statistic AS VP Percent: 88 %
Brady Statistic AS VS Percent: 0 %
Date Time Interrogation Session: 20250430165344
Implantable Lead Connection Status: 753985
Implantable Lead Connection Status: 753985
Implantable Lead Implant Date: 20140219
Implantable Lead Implant Date: 20140219
Implantable Lead Location: 753859
Implantable Lead Location: 753860
Implantable Lead Model: 5076
Implantable Lead Model: 5076
Implantable Pulse Generator Implant Date: 20140219
Lead Channel Impedance Value: 575 Ohm
Lead Channel Impedance Value: 685 Ohm
Lead Channel Pacing Threshold Amplitude: 0.5 V
Lead Channel Pacing Threshold Amplitude: 1.25 V
Lead Channel Pacing Threshold Pulse Width: 0.4 ms
Lead Channel Pacing Threshold Pulse Width: 0.4 ms
Lead Channel Setting Pacing Amplitude: 2 V
Lead Channel Setting Pacing Amplitude: 2.5 V
Lead Channel Setting Pacing Pulse Width: 1 ms
Lead Channel Setting Sensing Sensitivity: 2.8 mV
Zone Setting Status: 755011
Zone Setting Status: 755011

## 2023-07-19 ENCOUNTER — Other Ambulatory Visit (HOSPITAL_BASED_OUTPATIENT_CLINIC_OR_DEPARTMENT_OTHER): Payer: Self-pay

## 2023-07-19 MED ORDER — TIZANIDINE HCL 4 MG PO TABS
4.0000 mg | ORAL_TABLET | Freq: Two times a day (BID) | ORAL | 2 refills | Status: DC | PRN
  Filled 2023-07-19: qty 60, 30d supply, fill #0
  Filled 2023-08-26: qty 60, 30d supply, fill #1
  Filled 2023-09-30: qty 60, 30d supply, fill #2

## 2023-07-19 MED ORDER — PREGABALIN 225 MG PO CAPS
225.0000 mg | ORAL_CAPSULE | Freq: Two times a day (BID) | ORAL | 0 refills | Status: DC
Start: 2023-07-19 — End: 2023-12-06
  Filled 2023-07-19: qty 60, 30d supply, fill #0
  Filled 2023-09-16: qty 60, 30d supply, fill #1
  Filled 2023-10-25: qty 60, 30d supply, fill #2

## 2023-07-22 ENCOUNTER — Other Ambulatory Visit (HOSPITAL_BASED_OUTPATIENT_CLINIC_OR_DEPARTMENT_OTHER): Payer: Self-pay

## 2023-08-06 ENCOUNTER — Other Ambulatory Visit: Payer: Self-pay

## 2023-08-06 ENCOUNTER — Other Ambulatory Visit (HOSPITAL_BASED_OUTPATIENT_CLINIC_OR_DEPARTMENT_OTHER): Payer: Self-pay

## 2023-08-12 ENCOUNTER — Other Ambulatory Visit: Payer: Self-pay

## 2023-08-21 ENCOUNTER — Other Ambulatory Visit (HOSPITAL_BASED_OUTPATIENT_CLINIC_OR_DEPARTMENT_OTHER): Payer: Self-pay

## 2023-08-21 MED ORDER — TRAZODONE HCL 50 MG PO TABS
50.0000 mg | ORAL_TABLET | Freq: Every day | ORAL | 1 refills | Status: AC
  Filled 2023-08-21: qty 60, 60d supply, fill #0
  Filled 2024-01-28: qty 60, 30d supply, fill #0

## 2023-08-21 MED ORDER — ALBUTEROL SULFATE HFA 108 (90 BASE) MCG/ACT IN AERS
2.0000 | INHALATION_SPRAY | Freq: Two times a day (BID) | RESPIRATORY_TRACT | 0 refills | Status: DC
  Filled 2023-08-21: qty 6.7, 20d supply, fill #0

## 2023-08-21 MED ORDER — MECLIZINE HCL 12.5 MG PO TABS
12.5000 mg | ORAL_TABLET | Freq: Two times a day (BID) | ORAL | 2 refills | Status: AC | PRN
  Filled 2023-08-21 (×2): qty 60, 30d supply, fill #0

## 2023-08-21 MED ORDER — HYDROXYZINE HCL 10 MG PO TABS
10.0000 mg | ORAL_TABLET | Freq: Two times a day (BID) | ORAL | 1 refills | Status: AC | PRN
  Filled 2023-08-21: qty 60, 30d supply, fill #0
  Filled 2023-10-14: qty 60, 30d supply, fill #1

## 2023-08-22 NOTE — Progress Notes (Signed)
 Remote pacemaker transmission.

## 2023-08-22 NOTE — Addendum Note (Signed)
 Addended by: Lott Rouleau A on: 08/22/2023 11:28 AM   Modules accepted: Orders

## 2023-09-11 ENCOUNTER — Other Ambulatory Visit: Payer: Self-pay

## 2023-09-16 ENCOUNTER — Other Ambulatory Visit: Payer: Self-pay

## 2023-09-17 ENCOUNTER — Other Ambulatory Visit: Payer: Self-pay

## 2023-09-27 ENCOUNTER — Other Ambulatory Visit: Payer: Self-pay

## 2023-09-27 ENCOUNTER — Other Ambulatory Visit (HOSPITAL_BASED_OUTPATIENT_CLINIC_OR_DEPARTMENT_OTHER): Payer: Self-pay

## 2023-09-27 ENCOUNTER — Telehealth: Payer: Self-pay

## 2023-09-27 ENCOUNTER — Encounter: Payer: Self-pay | Admitting: Cardiology

## 2023-09-27 MED ORDER — TRAZODONE HCL 50 MG PO TABS
50.0000 mg | ORAL_TABLET | Freq: Every day | ORAL | 1 refills | Status: DC
  Filled 2023-09-27: qty 60, 30d supply, fill #0
  Filled 2023-11-06: qty 60, 30d supply, fill #1

## 2023-09-27 NOTE — Telephone Encounter (Signed)
   Pre-operative Risk Assessment    Patient Name: [name] [name]  DOB: Dec 21, 1975 MRN: [id number]   Date of last office visit: 12/19/2022, Charlies Arthur, PA-C Date of next office visit: NONE   Request for Surgical Clearance    Procedure:  Suprascapular RFA and Left axillary nerve RFA  Date of Surgery:  Clearance 10/21/23                                Surgeon:  Surgeon's Group or Practice Name: [facility] Phone number: [phone] Fax number: [phone]   Type of Clearance Requested:   - Medical    Type of Anesthesia:  Local    Additional requests/questions:  There is a question about a pacemaker  Signed, [place] KANDICE Dunning   09/27/2023, 2:04 PM

## 2023-09-27 NOTE — Progress Notes (Signed)
 PERIOPERATIVE PRESCRIPTION FOR IMPLANTED CARDIAC DEVICE PROGRAMMING  Patient Information: Name:  KENDELLE SCHWEERS  DOB:  January 19, 1976  MRN:  [id number]   Procedure:  Suprascapular RFA and Left axillary nerve RFA   Date of Surgery:  Clearance 10/21/23                                  Surgeon:  Surgeon's Group or Practice Name: [facility] Phone number: [phone] Fax number: [phone]   Type of Clearance Requested:   - Medical    Type of Anesthesia:  Local   Device Information:  Clinic EP Physician:  Soyla Norton, MD   Device Type:  Pacemaker Manufacturer and Phone #:  Medtronic: [phone] Pacemaker Dependent?:  Yes.   Date of Last Device Check:  07/10/2023 Normal Device Function?:  Yes.    Electrophysiologist's Recommendations:  Have magnet available. Provide continuous ECG monitoring when magnet is used or reprogramming is to be performed.  Procedure may interfere with device function.  Magnet should be placed over device during procedure.  Per Device Clinic Standing Orders, Delon DELENA Sharps, RN  3:19 PM 09/27/2023

## 2023-09-27 NOTE — Telephone Encounter (Addendum)
 Spoke to doctors office and explained we needed clarification on the pacemaker that the patient has, they informed me that the person who sent the document was gone for the day. Message was left for the department to call back the pre-op team on Monday. Message was also give to device team at [facility]. [name] [name]

## 2023-09-27 NOTE — Telephone Encounter (Signed)
  Will await clarification from surgical team regarding specific pacemaker concerns so that accurate information can be passed to our device clinic ultimately back to surgical team. Once clarification received, patient will also need a virtual visit for medical clearance.  Artist Pouch, PA-C

## 2023-10-01 NOTE — Telephone Encounter (Signed)
 Returned call to surgical center.  Reiterated recommendation to use magnet during procedure.  Per center, they plan on having industry present for procedure.  They are not requesting medical clearance-only device programming recommendations.  No further action needed.

## 2023-10-02 ENCOUNTER — Ambulatory Visit: Payer: Managed Care, Other (non HMO)

## 2023-10-09 ENCOUNTER — Ambulatory Visit

## 2023-10-14 ENCOUNTER — Other Ambulatory Visit: Payer: Self-pay | Admitting: Neurology

## 2023-10-14 ENCOUNTER — Other Ambulatory Visit (HOSPITAL_BASED_OUTPATIENT_CLINIC_OR_DEPARTMENT_OTHER): Payer: Self-pay

## 2023-10-14 MED ORDER — TRAMADOL-ACETAMINOPHEN 37.5-325 MG PO TABS
2.0000 | ORAL_TABLET | Freq: Four times a day (QID) | ORAL | 2 refills | Status: DC | PRN
  Filled 2023-10-14: qty 25, 3d supply, fill #0
  Filled 2023-11-22: qty 25, 3d supply, fill #1
  Filled 2024-01-03: qty 25, 4d supply, fill #2

## 2023-10-15 ENCOUNTER — Ambulatory Visit (INDEPENDENT_AMBULATORY_CARE_PROVIDER_SITE_OTHER)

## 2023-10-15 DIAGNOSIS — I442 Atrioventricular block, complete: Secondary | ICD-10-CM

## 2023-10-16 ENCOUNTER — Ambulatory Visit: Payer: Self-pay | Admitting: Cardiology

## 2023-10-16 LAB — CUP PACEART REMOTE DEVICE CHECK
Battery Impedance: 3400 Ohm
Battery Remaining Longevity: 14 mo
Battery Voltage: 2.71 V
Brady Statistic AP VP Percent: 16 %
Brady Statistic AP VS Percent: 0 %
Brady Statistic AS VP Percent: 84 %
Brady Statistic AS VS Percent: 0 %
Date Time Interrogation Session: 20250805110137
Implantable Lead Connection Status: 753985
Implantable Lead Connection Status: 753985
Implantable Lead Implant Date: 20140219
Implantable Lead Implant Date: 20140219
Implantable Lead Location: 753859
Implantable Lead Location: 753860
Implantable Lead Model: 5076
Implantable Lead Model: 5076
Implantable Pulse Generator Implant Date: 20140219
Lead Channel Impedance Value: 610 Ohm
Lead Channel Impedance Value: 700 Ohm
Lead Channel Pacing Threshold Amplitude: 0.5 V
Lead Channel Pacing Threshold Amplitude: 1.25 V
Lead Channel Pacing Threshold Pulse Width: 0.4 ms
Lead Channel Pacing Threshold Pulse Width: 0.4 ms
Lead Channel Setting Pacing Amplitude: 2 V
Lead Channel Setting Pacing Amplitude: 2.5 V
Lead Channel Setting Pacing Pulse Width: 1 ms
Lead Channel Setting Sensing Sensitivity: 2.8 mV
Zone Setting Status: 755011
Zone Setting Status: 755011

## 2023-10-25 ENCOUNTER — Other Ambulatory Visit: Payer: Self-pay

## 2023-10-29 ENCOUNTER — Other Ambulatory Visit (HOSPITAL_BASED_OUTPATIENT_CLINIC_OR_DEPARTMENT_OTHER): Payer: Self-pay

## 2023-10-29 MED ORDER — TIZANIDINE HCL 4 MG PO TABS
4.0000 mg | ORAL_TABLET | Freq: Two times a day (BID) | ORAL | 2 refills | Status: AC | PRN
  Filled 2023-10-29: qty 60, 30d supply, fill #0
  Filled 2024-02-04: qty 60, 30d supply, fill #1
  Filled 2024-04-02: qty 60, 30d supply, fill #2

## 2023-11-06 ENCOUNTER — Other Ambulatory Visit (HOSPITAL_BASED_OUTPATIENT_CLINIC_OR_DEPARTMENT_OTHER): Payer: Self-pay

## 2023-11-12 ENCOUNTER — Other Ambulatory Visit (HOSPITAL_BASED_OUTPATIENT_CLINIC_OR_DEPARTMENT_OTHER): Payer: Self-pay

## 2023-11-12 MED ORDER — MECLIZINE HCL 12.5 MG PO TABS
12.5000 mg | ORAL_TABLET | Freq: Two times a day (BID) | ORAL | 2 refills | Status: AC | PRN
  Filled 2023-11-12: qty 60, 30d supply, fill #0

## 2023-11-12 MED ORDER — HYDROXYZINE HCL 25 MG PO TABS
25.0000 mg | ORAL_TABLET | Freq: Two times a day (BID) | ORAL | 1 refills | Status: AC | PRN
  Filled 2023-11-12: qty 60, 30d supply, fill #0
  Filled 2023-12-16: qty 60, 30d supply, fill #1

## 2023-11-12 MED ORDER — ALBUTEROL SULFATE HFA 108 (90 BASE) MCG/ACT IN AERS
2.0000 | INHALATION_SPRAY | Freq: Two times a day (BID) | RESPIRATORY_TRACT | 0 refills | Status: DC
  Filled 2023-11-12: qty 6.7, 20d supply, fill #0

## 2023-11-22 ENCOUNTER — Other Ambulatory Visit: Payer: Self-pay

## 2023-11-25 ENCOUNTER — Other Ambulatory Visit: Payer: Self-pay | Admitting: Neurology

## 2023-11-27 NOTE — Progress Notes (Deleted)
 NEUROLOGY FOLLOW UP OFFICE NOTE  [name] [name] [id number]  Assessment/Plan:   1  Migraine without aura, without status migrainosus, not intractable 2  Left sided cervical radiculitis - evidence of C7   Migraine prevention:  topiramate  200mg  QHS Migraine rescue:  Ultracet  Left C7 radiculitis - under care of pain management Limit use of pain relievers to no more than 2 days out of week to prevent risk of rebound or medication-overuse headache. Keep headache diary Follow up one year or sooner if needed.  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   UPDATE: Left cervical radiculopathy: Under care of pain management.  On Lyrica  225mg  twice daily. *** surgery?  Migraine: *** Always wakes up with a dull headache.  Intensity:  Dull to moderate Duration:  1 hour Frequency:  5 times a month   Current NSAIDS: None.  Contraindicated due to ulcerative colitis. Current analgesics: Ultracet  Current triptans: None Current ergotamine: None Current anti-emetic: None Current muscle relaxants: tizanidine  4mg  BID PRN Current anti-anxiolytic: None Current sleep aide:None Current Antihypertensive medications: None Current Antidepressant medications: None Current Anticonvulsant medications: Topiramate  200 mg at bedtime, Lyrica  150mg  twice daily Current anti-CGRP: none Current Vitamins/Herbal/Supplements: iron supplement Current Antihistamines/Decongestants: None Other therapy: None Hormone/birth control: Loestrin  Fe   Caffeine: None Diet: Hydrates with water.  Cut out alcohol and soda.  Intermittent fasting.  Gluten-free, which has helped her ulcerative colitis symptoms and overall  well-being. Exercise: Increased Depression: yes Anxiety: yes.  As above Other pain:  Chronic left C7 radiculitis Sleep hygiene: Does not sleep well.  6 hours of broken sleep.  Wakes up frequently.  Mind doesn't settle.         HISTORY: Migraine: Onset:  Early 55s.  They resolved but returned about March 2017.[name] [name]  She has had increased depression and anxiety since experiencing cardiac arrest 4 years ago and was diagnosed with heart block.  She also was diagnosed with ulcerative colitis and has been unable to work since August. Location:  Bi-frontal, back of neck Quality:  pounding Initial intensity:  Constant 6/10 with episodes of 10/10 Aura:  no Prodrome:  no Associated symptoms: Nausea, vomiting, photophobia, phonophobia, vertigo, weakness, paresthesias Initial duration:  Constant (severe 10/10 fluctuations last 24 to 48 hours) Initial Frequency:  Daily (severe 10/10 fluctuations occur every 10 days) Triggers/aggravating factors:  Noise, light, movement Relieving factors:  Rest in quiet dark room with cool rag Activity:  Aggravated by light activity.  Cannot function at all when severe.   Past NSAIDS: Ibuprofen , naproxen Past analgesics: Midrin, Tylenol , Percocet, Fioricet, BC powder Past abortive triptans: Maxalt  (cause slurred speech/difficulty thinking) Past abortive ergotamine:  none Past muscle relaxants:  none Past anti-emetic:  none Past antihypertensive medications:  none Past antidepressant medications: Nortriptyline Past anticonvulsant medications:  gabapentin  (drowsiness) Past anti-CGRP:  none Past vitamins/Herbal/Supplements:  none Past antihistamines/decongestants:  none Other past therapies:  none   Family history of headache:  father CT of head from 03/23/14 was normal.   Left-sided cervical radiculitis: In 2015, she developed Shingles on the left arm.  In June 2018, she developed a sharp burning pain along the same distribution of her Shingles, the left side of  her neck down the lateral arm to the entire hand.  After a few days, the pain  subsided except for the hand and now she only notes constant pins and needles sensation in the index and lateral aspect of her middle finger.  She did not develop a rash.  She has some neck discomfort.  She denies weakness.  She tried Lyrica , which was too strong.  She would rather not take a pain medication due to potential drowsiness.  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  She had an NCV/EMG of the upper extremities on 01/17/2017 which demonstrated mild left chronic C7 radiculopathy as well as right median neuropathy at or distal to the wrist.  She was referred to pain specialist.  Symptoms improved.  She notes numbness and pain in left index finger.  Due to worsening radicular pain, repeat NCV-EMG on 12/27/2020 showed stable chronic left C7 radiculopathy.  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  Reported worsening constant numbness in the 4th and 5th digits of the left hand and heaviness of the left arm, with left sided neck pain.  Previously saw spine specialist who recommended surgery but she did not want to go that route.  She was referred to pain management .  Initial epidural injection effective but had a second epidural injection which failed.    Dizziness: She was seen in the ED on 05/28/2022 for dizziness described as lightheadedness and equilibrium off, improved if lying down and not moving.  She stood up and became pale and very dizzy and felt like she was going to pass out.  Pacemaker interrogation did not reveal abnormalities.  CTA head and neck  personally reviewed was negative for LVO or significant stenosis.   PAST MEDICAL HISTORY: Past Medical History:  Diagnosis Date   Abnormal Pap smear 2004   colpo   Alcohol abuse    Anxiety    AVNRT (AV nodal re-entry tachycardia) (HCC) 04/29/12   s/p slow pathway modification   Candida vaginitis 05/2006   Complete heart block, post-surgical (HCC) 04/30/12   s/p PPM implant   Depression    H/O candidiasis    H/O varicella 2005   IBS (irritable bowel syndrome)    Panic attack    Pelvic pain in female 2007    MEDICATIONS: Current Outpatient Medications on File Prior to Visit  Medication Sig Dispense Refill   acetaminophen -codeine  120-12 MG/5ML solution Take 10 mLs by mouth 4 (four) times daily for cough. 473 mL 0   albuterol  (VENTOLIN  HFA) 108 (90 Base) MCG/ACT inhaler Inhale 2 puffs into the lungs 2 (two) times daily. 6.7 g 0   albuterol  (VENTOLIN  HFA) 108 (90 Base) MCG/ACT inhaler Inhale 2 puffs into the lungs 2 (two) times daily. 6.7 g 0   azithromycin  (ZITHROMAX  Z-PAK) 250 MG tablet Take 2 tablets by mouth daily on day 1, then 1 tablet daily on days 2-5. 6 tablet 0   celecoxib  (CELEBREX ) 100 MG capsule Take 1 capsule (100 mg total) by mouth 2 (two) times daily as needed for moderate pain (4-6). Take with food. 60 capsule 0   ergocalciferol  (VITAMIN D2) 1.25 MG (50000 UT) capsule Take 1 capsule (50,000 Units total) by mouth once a week. 4 capsule 3   guaiFENesin -codeine  100-10 MG/5ML syrup Take 10 mLs by mouth every 4 -  6 hours as needed  for cough, max daily dose: 60 Milliliter. 200 mL 0   hydrOXYzine  (ATARAX ) 10 MG tablet Take 1 tablet (10 mg total) by mouth 1 (one) to 2 (two) times daily as needed for anxiety. 60 tablet 0   hydrOXYzine  (ATARAX ) 10 MG tablet Take 1 tablet (10 mg total) by mouth 1 (one) to 2 (two) times daily as needed for anxiety. 60 tablet 1   hydrOXYzine  (ATARAX ) 25 MG tablet Take 1 tablet (25 mg total) by mouth 1 (one) to  2 (two) times daily as needed for anxiety.  60 tablet 1   meclizine  (ANTIVERT ) 12.5 MG tablet Take 1 tablet (12.5 mg total) by mouth 2 (two) times daily as needed. 60 tablet 2   meclizine  (ANTIVERT ) 12.5 MG tablet Take 1 tablet (12.5 mg total) by mouth 2 (two) times daily as needed. 60 tablet 2   meclizine  (ANTIVERT ) 12.5 MG tablet Take 1 tablet (12.5 mg total) by mouth 2 (two) times daily as needed. 60 tablet 2   norethindrone -ethinyl estradiol -FE (BLISOVI  FE 1/20) 1-20 MG-MCG tablet Take 1 tablet by mouth every day 84 tablet 3   oseltamivir  (TAMIFLU ) 75 MG capsule Take 1 capsule (75 mg total) by mouth 2 (two) times daily. 10 capsule 0   pregabalin  (LYRICA ) 225 MG capsule Take 1 capsule (225 mg total) by mouth 2 (two) times daily. 180 capsule 0   tiZANidine  (ZANAFLEX ) 4 MG tablet Take 1 tablet (4 mg total) by mouth 2 (two) times daily as needed for muscle spasms. 60 tablet 2   tiZANidine  (ZANAFLEX ) 4 MG tablet Take 1 tablet (4 mg total) by mouth 2 (two) times daily as needed for muscle spasms. 60 tablet 2   topiramate  (TOPAMAX ) 200 MG tablet TAKE 1 TABLET AT BEDTIME 90 tablet 0   traMADol -acetaminophen  (ULTRACET ) 37.5-325 MG tablet Take 2 tablets by mouth every 6 (six) hours as needed for severe migraine. 25 tablet 2   traZODone  (DESYREL ) 50 MG tablet Take 1-2 tablets (50-100 mg total) by mouth at bedtime. 60 tablet 1   traZODone  (DESYREL ) 50 MG tablet Take 1-2 tablets (50-100 mg total) by mouth at bedtime. 60 tablet 1   traZODone  (DESYREL ) 50 MG tablet Take 1-2 tablets (50-100 mg total) by mouth at bedtime. 60 tablet 1   traZODone  (DESYREL ) 50 MG tablet Take 1-2 tablets (50-100 mg total) by mouth daily at bedtime. 60 tablet 1   traZODone  (DESYREL ) 50 MG tablet Take 1-2 tablets (50-100 mg total) by mouth at bedtime. 60 tablet 1   No current facility-administered medications on file prior to visit.    ALLERGIES: No Known Allergies  FAMILY HISTORY: Family History  Problem Relation Age of Onset   Diabetes Mother    Cancer Sister         Lump removed frfom shoulder   Heart disease Maternal Grandfather        MI   Cancer Paternal Grandmother        breast      Objective:  *** General: No acute distress.  Patient appears well-groomed.   Head:  Normocephalic/atraumatic Eyes:  Fundi examined but not visualized Neck: supple, no paraspinal tenderness, full range of motion Heart:  Regular rate and rhythm Neurological Exam: alert and oriented.  Speech fluent and not dysarthric, language intact.  CN II-XII intact. Bulk and tone normal, muscle strength 5/5 throughout.  Sensation to light touch reduced in 2nd and 3rd digits of left hand. ***   Deep tendon reflexes 2+  throughout, toes downgoing.  Finger to nose testing intact.  Gait normal, Romberg negative.   Juliene Dunnings, DO  CC: Ozell Kubas, PA-C

## 2023-11-28 ENCOUNTER — Ambulatory Visit: Payer: Managed Care, Other (non HMO) | Admitting: Neurology

## 2023-12-05 ENCOUNTER — Other Ambulatory Visit (HOSPITAL_BASED_OUTPATIENT_CLINIC_OR_DEPARTMENT_OTHER): Payer: Self-pay

## 2023-12-05 MED ORDER — TRAZODONE HCL 50 MG PO TABS
50.0000 mg | ORAL_TABLET | Freq: Every day | ORAL | 1 refills | Status: AC
  Filled 2023-12-05: qty 60, 30d supply, fill #0

## 2023-12-06 ENCOUNTER — Other Ambulatory Visit (HOSPITAL_BASED_OUTPATIENT_CLINIC_OR_DEPARTMENT_OTHER): Payer: Self-pay

## 2023-12-06 ENCOUNTER — Other Ambulatory Visit: Payer: Self-pay

## 2023-12-06 MED ORDER — PREGABALIN 225 MG PO CAPS
225.0000 mg | ORAL_CAPSULE | Freq: Two times a day (BID) | ORAL | 1 refills | Status: AC
  Filled 2023-12-06: qty 60, 30d supply, fill #0
  Filled 2024-01-26: qty 60, 30d supply, fill #1
  Filled 2024-04-02: qty 60, 30d supply, fill #2

## 2023-12-09 NOTE — Progress Notes (Signed)
 Remote PPM Transmission

## 2023-12-12 ENCOUNTER — Emergency Department (HOSPITAL_BASED_OUTPATIENT_CLINIC_OR_DEPARTMENT_OTHER)

## 2023-12-12 ENCOUNTER — Emergency Department (HOSPITAL_BASED_OUTPATIENT_CLINIC_OR_DEPARTMENT_OTHER)
Admission: EM | Admit: 2023-12-12 | Discharge: 2023-12-12 | Disposition: A | Discharge status: Home / Self Care | Attending: Emergency Medicine | Admitting: Emergency Medicine

## 2023-12-12 ENCOUNTER — Other Ambulatory Visit: Payer: Self-pay

## 2023-12-12 DIAGNOSIS — Z950 Presence of cardiac pacemaker: Secondary | ICD-10-CM | POA: Insufficient documentation

## 2023-12-12 DIAGNOSIS — D72829 Elevated white blood cell count, unspecified: Secondary | ICD-10-CM | POA: Insufficient documentation

## 2023-12-12 DIAGNOSIS — R4789 Other speech disturbances: Secondary | ICD-10-CM | POA: Insufficient documentation

## 2023-12-12 DIAGNOSIS — R079 Chest pain, unspecified: Secondary | ICD-10-CM | POA: Diagnosis present

## 2023-12-12 DIAGNOSIS — F419 Anxiety disorder, unspecified: Secondary | ICD-10-CM | POA: Diagnosis not present

## 2023-12-12 LAB — CBC WITH DIFFERENTIAL/PLATELET
Abs Immature Granulocytes: 0.04 K/uL (ref 0.00–0.07)
Basophils Absolute: 0.1 K/uL (ref 0.0–0.1)
Basophils Relative: 1 %
Eosinophils Absolute: 0 K/uL (ref 0.0–0.5)
Eosinophils Relative: 0 %
HCT: 41.2 % (ref 36.0–46.0)
Hemoglobin: 14.2 g/dL (ref 12.0–15.0)
Immature Granulocytes: 0 %
Lymphocytes Relative: 20 %
Lymphs Abs: 2.2 K/uL (ref 0.7–4.0)
MCH: 29.8 pg (ref 26.0–34.0)
MCHC: 34.5 g/dL (ref 30.0–36.0)
MCV: 86.6 fL (ref 80.0–100.0)
Monocytes Absolute: 0.6 K/uL (ref 0.1–1.0)
Monocytes Relative: 5 %
Neutro Abs: 8.2 K/uL — ABNORMAL HIGH (ref 1.7–7.7)
Neutrophils Relative %: 74 %
Platelets: 247 K/uL (ref 150–400)
RBC: 4.76 MIL/uL (ref 3.87–5.11)
RDW: 12.9 % (ref 11.5–15.5)
WBC: 11.1 K/uL — ABNORMAL HIGH (ref 4.0–10.5)
nRBC: 0 % (ref 0.0–0.2)

## 2023-12-12 LAB — BASIC METABOLIC PANEL WITH GFR
Anion gap: 16 — ABNORMAL HIGH (ref 5–15)
BUN: <5 mg/dL — ABNORMAL LOW (ref 6–20)
CO2: 19 mmol/L — ABNORMAL LOW (ref 22–32)
Calcium: 9.7 mg/dL (ref 8.9–10.3)
Chloride: 102 mmol/L (ref 98–111)
Creatinine, Ser: 0.93 mg/dL (ref 0.44–1.00)
GFR, Estimated: >60 mL/min (ref >60)
Glucose, Bld: 129 mg/dL — ABNORMAL HIGH (ref 70–99)
Potassium: 3.8 mmol/L (ref 3.5–5.1)
Sodium: 138 mmol/L (ref 135–145)

## 2023-12-12 LAB — URINALYSIS, ROUTINE W REFLEX MICROSCOPIC
Bilirubin Urine: NEGATIVE
Glucose, UA: NEGATIVE mg/dL
Ketones, ur: 15 mg/dL — AB
Leukocytes,Ua: NEGATIVE
Nitrite: NEGATIVE
Protein, ur: NEGATIVE mg/dL
Specific Gravity, Urine: 1.01 (ref 1.005–1.030)
pH: 7 (ref 5.0–8.0)

## 2023-12-12 LAB — URINALYSIS, MICROSCOPIC (REFLEX)

## 2023-12-12 LAB — TROPONIN T, HIGH SENSITIVITY
Troponin T High Sensitivity: <15 ng/L (ref 0–19)
Troponin T High Sensitivity: <15 ng/L (ref 0–19)

## 2023-12-12 LAB — PREGNANCY, URINE: Preg Test, Ur: NEGATIVE

## 2023-12-12 MED ORDER — LORAZEPAM 2 MG/ML IJ SOLN
1.0000 mg | Freq: Once | INTRAMUSCULAR | Status: AC
  Administered 2023-12-12: 1 mg via INTRAVENOUS
  Filled 2023-12-12: qty 1

## 2023-12-12 NOTE — ED Provider Notes (Signed)
 [place] EMERGENCY DEPARTMENT AT [facility] [place] Provider Note   CSN: [id number] Arrival date & time: 12/12/23  9051     Patient presents with: Anxiety   [name] [name] is a 48 y.o. female.  She has a history of pacemaker due to heart block after ablation.  She is under a lot of stress.  Today was having difficulty finding her words and also felt like her pacemaker was shocking her.  Feels like she passed out.  Denies any focal numbness or weakness but feels unsteady when she is walking.  Symptoms started this morning around 8 AM.  No prior history of same.  No headache.  No blurry vision or double vision.   The history is provided by the patient and a friend.  Cerebrovascular Accident This is a new problem. The current episode started 3 to 5 hours ago. The problem has been gradually improving. Associated symptoms include chest pain. Pertinent negatives include no abdominal pain, no headaches and no shortness of breath. The symptoms are aggravated by stress.       Prior to Admission medications   Medication Sig Start Date End Date Taking? Authorizing Provider  acetaminophen -codeine  120-12 MG/5ML solution Take 10 mLs by mouth 4 (four) times daily for cough. 02/22/23     albuterol  (VENTOLIN  HFA) 108 (90 Base) MCG/ACT inhaler Inhale 2 puffs into the lungs 2 (two) times daily. 04/30/23     albuterol  (VENTOLIN  HFA) 108 (90 Base) MCG/ACT inhaler Inhale 2 puffs into the lungs 2 (two) times daily. 11/12/23     azithromycin  (ZITHROMAX  Z-PAK) 250 MG tablet Take 2 tablets by mouth daily on day 1, then 1 tablet daily on days 2-5. 02/22/23     celecoxib  (CELEBREX ) 100 MG capsule Take 1 capsule (100 mg total) by mouth 2 (two) times daily as needed for moderate pain (4-6). Take with food. 06/20/23     ergocalciferol  (VITAMIN D2) 1.25 MG (50000 UT) capsule Take 1 capsule (50,000 Units total) by mouth once a week. 02/06/23     guaiFENesin -codeine  100-10 MG/5ML syrup Take 10 mLs by mouth every 4  -6 hours as needed  for cough, max daily dose: 60 Milliliter. 01/16/23     hydrOXYzine  (ATARAX ) 10 MG tablet Take 1 tablet (10 mg total) by mouth 1 (one) to 2 (two) times daily as needed for anxiety. 06/11/23     hydrOXYzine  (ATARAX ) 10 MG tablet Take 1 tablet (10 mg total) by mouth 1 (one) to 2 (two) times daily as needed for anxiety. 08/21/23     hydrOXYzine  (ATARAX ) 25 MG tablet Take 1 tablet (25 mg total) by mouth 1 (one) to  2 (two) times daily as needed for anxiety. 11/12/23     meclizine  (ANTIVERT ) 12.5 MG tablet Take 1 tablet (12.5 mg total) by mouth 2 (two) times daily as needed. 06/11/23     meclizine  (ANTIVERT ) 12.5 MG tablet Take 1 tablet (12.5 mg total) by mouth 2 (two) times daily as needed. 08/21/23     meclizine  (ANTIVERT ) 12.5 MG tablet Take 1 tablet (12.5 mg total) by mouth 2 (two) times daily as needed. 11/12/23     norethindrone -ethinyl estradiol -FE (BLISOVI  FE 1/20) 1-20 MG-MCG tablet Take 1 tablet by mouth every day 04/14/21     oseltamivir  (TAMIFLU ) 75 MG capsule Take 1 capsule (75 mg total) by mouth 2 (two) times daily. 01/16/23     pregabalin  (LYRICA ) 225 MG capsule Take 1 capsule (225 mg total) by mouth 2 (two) times daily. 12/06/23  tiZANidine  (ZANAFLEX ) 4 MG tablet Take 1 tablet (4 mg total) by mouth 2 (two) times daily as needed for muscle spasms. 05/02/23     tiZANidine  (ZANAFLEX ) 4 MG tablet Take 1 tablet (4 mg total) by mouth 2 (two) times daily as needed for muscle spasms. 10/29/23     topiramate  (TOPAMAX ) 200 MG tablet TAKE 1 TABLET AT BEDTIME 11/25/23   Skeet, Adam R, DO  traMADol -acetaminophen  (ULTRACET ) 37.5-325 MG tablet Take 2 tablets by mouth every 6 (six) hours as needed for severe migraine. 10/14/23 04/11/24  Skeet Juliene SAUNDERS, DO  traZODone  (DESYREL ) 50 MG tablet Take 1-2 tablets (50-100 mg total) by mouth at bedtime. 01/30/23     traZODone  (DESYREL ) 50 MG tablet Take 1-2 tablets (50-100 mg total) by mouth at bedtime. 02/27/23     traZODone  (DESYREL ) 50 MG tablet Take 1-2 tablets  (50-100 mg total) by mouth at bedtime. 06/11/23     traZODone  (DESYREL ) 50 MG tablet Take 1-2 tablets (50-100 mg total) by mouth daily at bedtime. 08/21/23     traZODone  (DESYREL ) 50 MG tablet Take 1-2 tablets (50-100 mg total) by mouth at bedtime. 12/05/23       Allergies: Patient has no known allergies.    Review of Systems  Constitutional:  Negative for fever.  HENT:  Negative for sore throat.   Respiratory:  Negative for shortness of breath.   Cardiovascular:  Positive for chest pain.  Gastrointestinal:  Negative for abdominal pain.  Genitourinary:  Negative for dysuria.  Skin:  Negative for rash.  Neurological:  Positive for speech difficulty and light-headedness. Negative for facial asymmetry, weakness and headaches.    Updated Vital Signs BP (!) 142/91 (BP Location: Right Arm)   Pulse (!) 102   Temp 98 F (36.7 C) (Oral)   Resp 20   Ht 5' 4 (1.626 m)   Wt 71.7 kg   SpO2 99%   BMI 27.12 kg/m   Physical Exam Vitals and nursing note reviewed.  Constitutional:      General: She is not in acute distress.    Appearance: Normal appearance. She is well-developed.  HENT:     Head: Normocephalic and atraumatic.  Eyes:     Conjunctiva/sclera: Conjunctivae normal.  Cardiovascular:     Rate and Rhythm: Normal rate and regular rhythm.     Heart sounds: No murmur heard. Pulmonary:     Effort: Pulmonary effort is normal. No respiratory distress.     Breath sounds: Normal breath sounds. No stridor. No wheezing.  Abdominal:     Palpations: Abdomen is soft.     Tenderness: There is no abdominal tenderness. There is no guarding or rebound.  Musculoskeletal:        General: No tenderness or deformity. Normal range of motion.     Cervical back: Neck supple.  Skin:    General: Skin is warm and dry.  Neurological:     General: No focal deficit present.     Mental Status: She is alert and oriented to person, place, and time.     GCS: GCS eye subscore is 4. GCS verbal subscore is 5.  GCS motor subscore is 6.     Cranial Nerves: No cranial nerve deficit.     Sensory: No sensory deficit.     Motor: No weakness.     (all labs ordered are listed, but only abnormal results are displayed) Labs Reviewed  BASIC METABOLIC PANEL WITH GFR - Abnormal; Notable for the following components:  Result Value   CO2 19 (*)    Glucose, Bld 129 (*)    BUN <5 (*)    Anion gap 16 (*)    All other components within normal limits  CBC WITH DIFFERENTIAL/PLATELET - Abnormal; Notable for the following components:   WBC 11.1 (*)    Neutro Abs 8.2 (*)    All other components within normal limits  URINALYSIS, ROUTINE W REFLEX MICROSCOPIC - Abnormal; Notable for the following components:   Hgb urine dipstick LARGE (*)    Ketones, ur 15 (*)    All other components within normal limits  URINALYSIS, MICROSCOPIC (REFLEX) - Abnormal; Notable for the following components:   Bacteria, UA RARE (*)    All other components within normal limits  PREGNANCY, URINE  TROPONIN T, HIGH SENSITIVITY  TROPONIN T, HIGH SENSITIVITY    EKG: EKG Interpretation Date/Time:  Thursday December 12 2023 10:00:24 EDT Ventricular Rate:  92 PR Interval:  124 QRS Duration:  151 QT Interval:  405 QTC Calculation: 502 R Axis:   -84  Text Interpretation: ventric paced rhythmn Confirmed by Towana Sharper [id number]) on 12/12/2023 10:04:11 AM  Radiology: DG Chest 1 View Result Date: 12/12/2023 CLINICAL DATA:  Pacemaker malfunction. EXAM: CHEST  1 VIEW COMPARISON:  February 19, 2015. FINDINGS: The heart size and mediastinal contours are within normal limits. Stable position of left-sided pacemaker with leads grossly intact. Both lungs are clear. The visualized skeletal structures are unremarkable. IMPRESSION: No active disease. Electronically Signed   By: Lynwood Landy Raddle M.D.   On: 12/12/2023 11:49   CT Head Wo Contrast Result Date: 12/12/2023 EXAM: CT HEAD WITHOUT CONTRAST 12/12/2023 11:33:00 AM TECHNIQUE: CT of the  head was performed without the administration of intravenous contrast. Automated exposure control, iterative reconstruction, and/or weight based adjustment of the mA/kV was utilized to reduce the radiation dose to as low as reasonably achievable. COMPARISON: CT head and CTA head and neck dated 05/28/2022. CLINICAL HISTORY: Neuro deficit, acute, stroke suspected; word finding difficulty. Pacemaker burning, near syncope. FINDINGS: BRAIN AND VENTRICLES: No acute hemorrhage. No evidence of acute infarct. No hydrocephalus. No extra-axial collection. No mass effect or midline shift. ORBITS: No acute abnormality. SINUSES: No acute abnormality. SOFT TISSUES AND SKULL: No acute soft tissue abnormality. No skull fracture. IMPRESSION: 1. No acute intracranial abnormality. Electronically signed by: Donnice Mania MD 12/12/2023 11:45 AM EDT RP Workstation: [id number]     Procedures   Medications Ordered in the ED  LORazepam  (ATIVAN ) injection 1 mg (1 mg Intravenous Given 12/12/23 1225)    Clinical Course as of 12/12/23 1832  Thu Dec 12, 2023  1227 Chest x-ray interpreted by me as no acute infiltrate.  Awaiting radiology reading. [MB]  1508 Patient's lab work showing mildly elevated white count, urinalysis without obvious signs of infection, chemistries with mildly low bicarb which appears to be chronic.  2 troponins flat.  Pacemaker interrogated and awaiting report. [MB]  1525 Pacemaker interrogation did not show any acute events.  Reviewed all this with patient.  She is comfortable plan for discharge and outpatient follow-up.  Return instructions discussed [MB]    Clinical Course User Index [MB] Towana Sharper BROCKS, MD                                 Medical Decision Making Amount and/or Complexity of Data Reviewed Labs: ordered. Radiology: ordered.  Risk Prescription drug management.   This patient complains  of feeling very stressed, difficulty getting her words out at times, intermittent chest pain;  this involves an extensive number of treatment Options and is a complaint that carries with it a high risk of complications and morbidity. The differential includes anxiety, stroke, TIA, ACS  I ordered, reviewed and interpreted labs, which included CBC with mildly elevated white count, chemistries with low bicarb similar to priors, troponins flat, urinalysis without signs of infection I ordered medication IV Ativan  and reviewed PMP when indicated. I ordered imaging studies which included chest x-ray and head CT and I independently    visualized and interpreted imaging which showed no acute findings Additional history obtained from patient's companion Previous records obtained and reviewed in epic including recent neurology, pain clinic , primary care notes Cardiac monitoring reviewed, normal sinus rhythm Social determinants considered, no significant barriers Critical Interventions: None  After the interventions stated above, I reevaluated the patient and found patient's symptoms to be improved and no evidence of any serious etiology Admission and further testing considered, no indications for admission at this time.  Recommended close follow-up with her treatment team.  Return instructions discussed.      Final diagnoses:  Word finding difficulty  Nonspecific chest pain    ED Discharge Orders     None          Towana Ozell BROCKS, MD 12/12/23 [phone]

## 2023-12-12 NOTE — Discharge Instructions (Signed)
 You were seen in the emergency department with intermittent difficulty in finding words along with some intermittent chest pain.  You had a CAT scan of your head along with lab work EKG chest x-ray and interrogation of your pacemaker that did not show an obvious explanation for your symptoms.  Please continue your regular medications and follow-up with your primary care doctor for further evaluation.  Return to the emergency department if any worsening or concerning symptoms.

## 2023-12-12 NOTE — ED Triage Notes (Addendum)
 Pt arrives to ED via [facility]. Pt is crying in triage. Hx of SVT. Pt voices that she needs him to leave. States that her and her husband are getting a divorce. Pt states that she has a pacemaker. Pt states that she a lot of stress at home. Feels like she is having anxiety and that she is passing out. Pt states that pacemaker is burning. Pt alert and oriented x 4 in triage. Denies SI/HI.

## 2023-12-18 ENCOUNTER — Other Ambulatory Visit (HOSPITAL_BASED_OUTPATIENT_CLINIC_OR_DEPARTMENT_OTHER): Payer: Self-pay

## 2023-12-18 MED ORDER — NA SULFATE-K SULFATE-MG SULF 17.5-3.13-1.6 GM/177ML PO SOLN
ORAL | 0 refills | Status: AC
  Filled 2023-12-18: qty 354, 1d supply, fill #0

## 2023-12-26 ENCOUNTER — Other Ambulatory Visit (HOSPITAL_BASED_OUTPATIENT_CLINIC_OR_DEPARTMENT_OTHER): Payer: Self-pay

## 2023-12-26 MED ORDER — MECLIZINE HCL 12.5 MG PO TABS
12.5000 mg | ORAL_TABLET | Freq: Two times a day (BID) | ORAL | 2 refills | Status: AC | PRN
  Filled 2023-12-26: qty 60, 30d supply, fill #0
  Filled 2024-02-21: qty 60, 30d supply, fill #1

## 2023-12-26 MED ORDER — ALBUTEROL SULFATE HFA 108 (90 BASE) MCG/ACT IN AERS
2.0000 | INHALATION_SPRAY | Freq: Two times a day (BID) | RESPIRATORY_TRACT | 0 refills | Status: DC
  Filled 2023-12-26: qty 6.7, 30d supply, fill #0

## 2023-12-26 MED ORDER — ALPRAZOLAM 0.5 MG PO TABS
0.5000 mg | ORAL_TABLET | Freq: Two times a day (BID) | ORAL | 0 refills | Status: DC | PRN
  Filled 2023-12-26: qty 30, 15d supply, fill #0

## 2023-12-26 MED ORDER — ESCITALOPRAM OXALATE 10 MG PO TABS
10.0000 mg | ORAL_TABLET | Freq: Every day | ORAL | 0 refills | Status: AC
  Filled 2023-12-26: qty 30, 30d supply, fill #0

## 2023-12-27 ENCOUNTER — Other Ambulatory Visit: Payer: Self-pay

## 2024-01-01 ENCOUNTER — Ambulatory Visit: Payer: Managed Care, Other (non HMO)

## 2024-01-03 ENCOUNTER — Other Ambulatory Visit (HOSPITAL_BASED_OUTPATIENT_CLINIC_OR_DEPARTMENT_OTHER): Payer: Self-pay

## 2024-01-08 ENCOUNTER — Ambulatory Visit

## 2024-01-10 ENCOUNTER — Telehealth: Payer: Self-pay | Admitting: Physician Assistant

## 2024-01-10 NOTE — Telephone Encounter (Signed)
 Acknowledged. Faxed 01/10/24 at 1202p and success confirmation received. DW, RN

## 2024-01-10 NOTE — Telephone Encounter (Signed)
 Sari called from office requesting the last interrogation for pace maker to be faxed. Please advise

## 2024-01-14 ENCOUNTER — Ambulatory Visit

## 2024-01-26 ENCOUNTER — Other Ambulatory Visit (HOSPITAL_BASED_OUTPATIENT_CLINIC_OR_DEPARTMENT_OTHER): Payer: Self-pay

## 2024-01-27 ENCOUNTER — Other Ambulatory Visit: Payer: Self-pay

## 2024-01-28 ENCOUNTER — Other Ambulatory Visit (HOSPITAL_BASED_OUTPATIENT_CLINIC_OR_DEPARTMENT_OTHER): Payer: Self-pay

## 2024-02-03 ENCOUNTER — Other Ambulatory Visit (HOSPITAL_BASED_OUTPATIENT_CLINIC_OR_DEPARTMENT_OTHER): Payer: Self-pay

## 2024-02-03 MED ORDER — ALPRAZOLAM 0.5 MG PO TABS
0.5000 mg | ORAL_TABLET | Freq: Two times a day (BID) | ORAL | 0 refills | Status: DC | PRN
  Filled 2024-02-03: qty 60, 30d supply, fill #0

## 2024-02-03 MED ORDER — ESCITALOPRAM OXALATE 20 MG PO TABS
20.0000 mg | ORAL_TABLET | Freq: Every day | ORAL | 0 refills | Status: DC
  Filled 2024-02-03: qty 30, 30d supply, fill #0

## 2024-02-03 MED ORDER — ALBUTEROL SULFATE HFA 108 (90 BASE) MCG/ACT IN AERS
2.0000 | INHALATION_SPRAY | Freq: Two times a day (BID) | RESPIRATORY_TRACT | 0 refills | Status: DC
  Filled 2024-02-03: qty 6.7, 30d supply, fill #0

## 2024-02-04 ENCOUNTER — Other Ambulatory Visit (HOSPITAL_BASED_OUTPATIENT_CLINIC_OR_DEPARTMENT_OTHER): Payer: Self-pay

## 2024-02-07 ENCOUNTER — Other Ambulatory Visit (HOSPITAL_BASED_OUTPATIENT_CLINIC_OR_DEPARTMENT_OTHER): Payer: Self-pay

## 2024-02-14 ENCOUNTER — Ambulatory Visit

## 2024-02-21 ENCOUNTER — Other Ambulatory Visit (HOSPITAL_BASED_OUTPATIENT_CLINIC_OR_DEPARTMENT_OTHER): Payer: Self-pay

## 2024-02-21 ENCOUNTER — Other Ambulatory Visit: Payer: Self-pay | Admitting: Neurology

## 2024-02-21 MED ORDER — TRAMADOL-ACETAMINOPHEN 37.5-325 MG PO TABS
2.0000 | ORAL_TABLET | Freq: Four times a day (QID) | ORAL | 2 refills | Status: AC | PRN
  Filled 2024-02-21: qty 25, 3d supply, fill #0
  Filled 2024-04-02: qty 25, 4d supply, fill #1

## 2024-02-24 ENCOUNTER — Other Ambulatory Visit (HOSPITAL_BASED_OUTPATIENT_CLINIC_OR_DEPARTMENT_OTHER): Payer: Self-pay

## 2024-02-24 MED ORDER — BENZONATATE 100 MG PO CAPS
100.0000 mg | ORAL_CAPSULE | Freq: Two times a day (BID) | ORAL | 0 refills | Status: AC | PRN
  Filled 2024-02-24: qty 30, 15d supply, fill #0

## 2024-02-27 ENCOUNTER — Other Ambulatory Visit (HOSPITAL_BASED_OUTPATIENT_CLINIC_OR_DEPARTMENT_OTHER): Payer: Self-pay

## 2024-02-27 MED ORDER — AZITHROMYCIN 250 MG PO TABS
ORAL_TABLET | ORAL | 0 refills | Status: AC
  Filled 2024-02-27: qty 6, 5d supply, fill #0

## 2024-03-16 ENCOUNTER — Ambulatory Visit

## 2024-03-17 ENCOUNTER — Other Ambulatory Visit (HOSPITAL_BASED_OUTPATIENT_CLINIC_OR_DEPARTMENT_OTHER): Payer: Self-pay

## 2024-03-19 ENCOUNTER — Other Ambulatory Visit (HOSPITAL_BASED_OUTPATIENT_CLINIC_OR_DEPARTMENT_OTHER): Payer: Self-pay

## 2024-03-19 MED ORDER — PREDNISONE 5 MG (21) PO TBPK
ORAL_TABLET | ORAL | 0 refills | Status: AC
  Filled 2024-03-19: qty 21, 6d supply, fill #0

## 2024-03-19 MED ORDER — DOXYCYCLINE HYCLATE 100 MG PO CAPS
100.0000 mg | ORAL_CAPSULE | Freq: Two times a day (BID) | ORAL | 0 refills | Status: AC
  Filled 2024-03-19: qty 14, 7d supply, fill #0

## 2024-03-19 MED ORDER — ALPRAZOLAM 0.5 MG PO TABS
0.5000 mg | ORAL_TABLET | Freq: Two times a day (BID) | ORAL | 0 refills | Status: DC | PRN
  Filled 2024-03-19: qty 60, 30d supply, fill #0

## 2024-03-19 MED ORDER — ALBUTEROL SULFATE HFA 108 (90 BASE) MCG/ACT IN AERS
2.0000 | INHALATION_SPRAY | Freq: Two times a day (BID) | RESPIRATORY_TRACT | 0 refills | Status: AC
  Filled 2024-03-19: qty 6.7, 30d supply, fill #0

## 2024-03-19 MED ORDER — ESCITALOPRAM OXALATE 20 MG PO TABS
20.0000 mg | ORAL_TABLET | Freq: Every day | ORAL | 0 refills | Status: AC
  Filled 2024-03-19: qty 30, 30d supply, fill #0

## 2024-03-19 MED ORDER — BENZONATATE 200 MG PO CAPS
200.0000 mg | ORAL_CAPSULE | Freq: Three times a day (TID) | ORAL | 0 refills | Status: AC | PRN
  Filled 2024-03-19: qty 30, 10d supply, fill #0

## 2024-03-19 MED ORDER — TRAZODONE HCL 50 MG PO TABS
50.0000 mg | ORAL_TABLET | Freq: Every evening | ORAL | 1 refills | Status: AC
  Filled 2024-03-19: qty 60, 30d supply, fill #0

## 2024-04-01 ENCOUNTER — Ambulatory Visit: Payer: Managed Care, Other (non HMO)

## 2024-04-02 ENCOUNTER — Other Ambulatory Visit: Payer: Self-pay

## 2024-04-03 ENCOUNTER — Telehealth: Payer: Self-pay

## 2024-04-03 NOTE — Telephone Encounter (Signed)
 Patient is past due for annual appt with Dr. Inocencio and/or APP.  Also is not consistently remotely transmitting.  She has an adapta with estimated 12 mths to ERI with range of possibly <58month and is dependent on device.   Needs next available appt with and APP or Dr. Inocencio.  Please put in appt notes:   Discuss remote monitoring and need to send monthly for battery monitoring

## 2024-04-14 ENCOUNTER — Ambulatory Visit

## 2024-04-16 ENCOUNTER — Ambulatory Visit

## 2024-04-16 ENCOUNTER — Other Ambulatory Visit (HOSPITAL_BASED_OUTPATIENT_CLINIC_OR_DEPARTMENT_OTHER): Payer: Self-pay

## 2024-04-16 MED ORDER — TRAZODONE HCL 50 MG PO TABS
50.0000 mg | ORAL_TABLET | Freq: Every day | ORAL | 1 refills | Status: AC
  Filled 2024-04-16: qty 60, 30d supply, fill #0

## 2024-04-16 MED ORDER — WEGOVY 1.5 MG PO TABS
1.5000 mg | ORAL_TABLET | Freq: Every day | ORAL | 0 refills | Status: AC
  Filled 2024-04-16: qty 30, 30d supply, fill #0

## 2024-04-16 MED ORDER — ALPRAZOLAM 0.5 MG PO TABS
0.5000 mg | ORAL_TABLET | Freq: Two times a day (BID) | ORAL | 0 refills | Status: AC | PRN
  Filled 2024-04-16: qty 60, 30d supply, fill #0

## 2024-04-17 ENCOUNTER — Other Ambulatory Visit (HOSPITAL_BASED_OUTPATIENT_CLINIC_OR_DEPARTMENT_OTHER): Payer: Self-pay

## 2024-04-17 NOTE — Progress Notes (Unsigned)
 "  NEUROLOGY FOLLOW UP OFFICE NOTE  [name] [name] [id number]  Assessment/Plan:   1  Migraine without aura, without status migrainosus, not intractable 2  Left sided cervical radiculitis - evidence of C7   Migraine prevention:  topiramate  200mg  QHS Migraine rescue:  Ultracet  Left C7 radiculitis - under care of pain management Limit use of pain relievers to no more than 9 days out of the month to prevent risk of rebound or medication-overuse headache. Keep headache diary Follow up one year or sooner if needed.  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   UPDATE:  Migraine: Complicated by her radicular pain as well as increased emotional stress.   Always wakes up with a dull headache.  Intensity:  Dull to moderate Duration:  1 hour Frequency:  5 times a month  Left cervical radiculopathy:   Discussing with pain management about referral to surgery.    Current NSAIDS: None.  Contraindicated due to ulcerative colitis. Current analgesics: Ultracet  Current triptans: None Current ergotamine: None Current anti-emetic: None Current muscle relaxants: tizanidine  4mg  BID PRN Current anti-anxiolytic: None Current sleep aide:None Current Antihypertensive medications: None Current Antidepressant medications: None Current Anticonvulsant medications: Topiramate  200 mg at bedtime, Lyrica  150mg  twice daily Current anti-CGRP: none Current Vitamins/Herbal/Supplements: iron supplement Current Antihistamines/Decongestants: None Other therapy: None Hormone/birth control: Loestrin  Fe   Caffeine: None Diet: Hydrates with water.  Cut out alcohol and soda.  Intermittent fasting.  Gluten-free, which has  helped her ulcerative colitis symptoms and overall well-being. Exercise: Increased Depression: yes Anxiety: yes.  As above Other pain:  Chronic left C7 radiculitis Sleep hygiene: Does not sleep well.  6 hours of broken sleep.  Wakes up frequently.  Mind doesn't settle.     Reports worsening constant numbness in the 4th and 5th digits of the left hand and heaviness of the left arm, with left sided neck pain.  Previously saw spine specialist who recommended surgery but she did not want to go that route.     HISTORY: Migraine: Onset:  Early 29s.  They resolved but returned about March 2017.[name] [name]  She has had increased depression and anxiety since experiencing cardiac arrest 4 years ago and was diagnosed with heart block.  She also was diagnosed with ulcerative colitis and has been unable to work since August. Location:  Bi-frontal, back of neck Quality:  pounding Initial intensity:  Constant 6/10 with episodes of 10/10 Aura:  no Prodrome:  no Associated symptoms: Nausea, vomiting, photophobia, phonophobia, vertigo, weakness, paresthesias Initial duration:  Constant (severe 10/10 fluctuations last 24 to 48 hours) Initial Frequency:  Daily (severe 10/10 fluctuations occur every 10 days) Triggers/aggravating factors:  Noise, light, movement Relieving factors:  Rest in quiet dark room with cool rag Activity:  Aggravated by light activity.  Cannot function at all when severe.   Past NSAIDS: Ibuprofen , naproxen Past analgesics: Midrin, Tylenol , Percocet, Fioricet, BC powder Past abortive triptans: Maxalt  (cause slurred speech/difficulty thinking) Past abortive ergotamine:  none Past muscle relaxants:  none Past anti-emetic:  none Past antihypertensive medications:  none Past antidepressant medications: Nortriptyline Past anticonvulsant medications:  gabapentin  (drowsiness) Past anti-CGRP:  none Past vitamins/Herbal/Supplements:  none Past antihistamines/decongestants:  none Other past  therapies:  none   Family history of headache:  father CT of head from  03/23/14 was normal.   Left-sided cervical radiculitis: In 2015, she developed Shingles on the left arm.  In June 2018, she developed a sharp burning pain along the same distribution of her Shingles, the left side of her neck down the lateral arm to the entire hand.  After a few days, the pain subsided except for the hand and now she only notes constant pins and needles sensation in the index and lateral aspect of her middle finger.  She did not develop a rash.  She has some neck discomfort.  She denies weakness.  She tried Lyrica , which was too strong.  She would rather not take a pain medication due to potential drowsiness.  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  She had an NCV/EMG of the upper extremities on 01/17/2017 which demonstrated mild left chronic C7 radiculopathy as well as right median neuropathy at or distal to the wrist.  She was referred to pain specialist.  Symptoms improved.  She notes numbness and pain in left index finger.  Due to worsening radicular pain, repeat NCV-EMG on 12/27/2020 showed stable chronic left C7 radiculopathy.  Switched from gabapentin  to Lyrica .  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  She was referred to pain management .  Initial epidural injection effective but had a second epidural injection which failed.  PAST MEDICAL HISTORY: Past Medical History:  Diagnosis Date   Abnormal Pap smear 2004   colpo   Alcohol abuse    Anxiety    AVNRT (AV nodal re-entry tachycardia) 04/29/12   s/p slow pathway modification   Candida  vaginitis 05/2006   Complete heart block, post-surgical (HCC) 04/30/12   s/p PPM implant   Depression    H/O candidiasis    H/O varicella 2005   IBS (irritable bowel syndrome)    Panic attack    Pelvic pain in female 2007    MEDICATIONS: Current Outpatient Medications on File Prior to Visit  Medication Sig Dispense Refill   acetaminophen -codeine  120-12 MG/5ML solution Take 10 mLs by mouth 4 (four) times daily for cough. 473 mL 0   albuterol  (VENTOLIN  HFA) 108 (90 Base) MCG/ACT inhaler Inhale 2 puffs into the lungs 2 (two) times daily. 6.7 g 0   albuterol  (VENTOLIN  HFA) 108 (90 Base) MCG/ACT inhaler Inhale 2 puffs into the lungs 2 (two) times daily. 8.5 g 0   ALPRAZolam  (XANAX ) 0.5 MG tablet Take 1 tablet (0.5 mg total) by mouth 2 (two) times daily as needed. 60 tablet 0   azithromycin  (ZITHROMAX  Z-PAK) 250 MG tablet Take 2 tablets by mouth daily on day 1, then 1 tablet daily on days 2-5. 6 tablet 0   azithromycin  (ZITHROMAX ) 250 MG tablet Take 2 tablets (500 mg) by mouth on day 1, THEN take 1 tablet (250 mg) daily for 4 days. 6 tablet 0   benzonatate  (TESSALON ) 100 MG capsule Take 1 (one) capsule twice a day as needed 30 capsule 0   benzonatate  (TESSALON ) 200 MG capsule Take 1 capsule (200 mg total) by mouth 3 (three) times daily as needed for cough 30 capsule 0   celecoxib  (CELEBREX ) 100 MG capsule Take 1 capsule (100 mg total) by mouth 2 (two) times daily as  needed for moderate pain (4-6). Take with food. 60 capsule 0   doxycycline  (VIBRAMYCIN ) 100 MG capsule Take 1 capsule (100 mg total) by mouth 2 (two) times daily. 14 capsule 0   ergocalciferol  (VITAMIN D2) 1.25 MG (50000 UT) capsule Take 1 capsule (50,000 Units total) by mouth once a week. 4 capsule 3   escitalopram  (LEXAPRO ) 10 MG tablet Take 1 tablet (10 mg total) by mouth daily. 30 tablet 0   escitalopram  (LEXAPRO ) 20 MG tablet Take 1 tablet (20 mg total) by mouth daily. 90 tablet 0   guaiFENesin -codeine  100-10 MG/5ML syrup Take 10  mLs by mouth every 4 -6 hours as needed  for cough, max daily dose: 60 Milliliter. 200 mL 0   hydrOXYzine  (ATARAX ) 10 MG tablet Take 1 tablet (10 mg total) by mouth 1 (one) to 2 (two) times daily as needed for anxiety. 60 tablet 0   hydrOXYzine  (ATARAX ) 10 MG tablet Take 1 tablet (10 mg total) by mouth 1 (one) to 2 (two) times daily as needed for anxiety. 60 tablet 1   hydrOXYzine  (ATARAX ) 25 MG tablet Take 1 tablet (25 mg total) by mouth 1 (one) to  2 (two) times daily as needed for anxiety. 60 tablet 1   meclizine  (ANTIVERT ) 12.5 MG tablet Take 1 tablet (12.5 mg total) by mouth 2 (two) times daily as needed. 60 tablet 2   meclizine  (ANTIVERT ) 12.5 MG tablet Take 1 tablet (12.5 mg total) by mouth 2 (two) times daily as needed. 60 tablet 2   meclizine  (ANTIVERT ) 12.5 MG tablet Take 1 tablet (12.5 mg total) by mouth 2 (two) times daily as needed. 60 tablet 2   meclizine  (ANTIVERT ) 12.5 MG tablet Take 1 tablet (12.5 mg total) by mouth 2 (two) times daily as needed. 60 tablet 2   Na Sulfate-K Sulfate-Mg Sulfate concentrate (SUPREP BOWEL PREP KIT) 17.5-3.13-1.6 GM/177ML SOLN Take 354 mL by mouth as directed 354 mL 0   norethindrone -ethinyl estradiol -FE (BLISOVI  FE 1/20) 1-20 MG-MCG tablet Take 1 tablet by mouth every day 84 tablet 3   oseltamivir  (TAMIFLU ) 75 MG capsule Take 1 capsule (75 mg total) by mouth 2 (two) times daily. 10 capsule 0   predniSONE  (STERAPRED UNI-PAK 21 TAB) 5 MG (21) TBPK tablet Taper as directed on package 21 tablet 0   pregabalin  (LYRICA ) 225 MG capsule Take 1 capsule (225 mg total) by mouth 2 (two) times daily. 180 capsule 1   semaglutide -weight management (WEGOVY ) 1.5 MG tablet Take 1 tablet (1.5 mg total) by mouth daily 1/2 hour before food. 30 tablet 0   tiZANidine  (ZANAFLEX ) 4 MG tablet Take 1 tablet (4 mg total) by mouth 2 (two) times daily as needed for muscle spasms. 60 tablet 2   tiZANidine  (ZANAFLEX ) 4 MG tablet Take 1 tablet (4 mg total) by mouth 2 (two) times daily as  needed for muscle spasms. 60 tablet 2   topiramate  (TOPAMAX ) 200 MG tablet TAKE 1 TABLET AT BEDTIME 90 tablet 3   traMADol -acetaminophen  (ULTRACET ) 37.5-325 MG tablet Take 2 tablets by mouth every 6 (six) hours as needed for severe migraine. 25 tablet 2   traZODone  (DESYREL ) 50 MG tablet Take 1-2 tablets (50-100 mg total) by mouth at bedtime. 60 tablet 1   traZODone  (DESYREL ) 50 MG tablet Take 1-2 tablets (50-100 mg total) by mouth at bedtime. 60 tablet 1   traZODone  (DESYREL ) 50 MG tablet Take 1-2 tablets (50-100 mg total) by mouth at bedtime. 60 tablet 1   traZODone  (DESYREL ) 50 MG  tablet Take 1-2 tablets (50-100 mg total) by mouth daily at bedtime. 60 tablet 1   traZODone  (DESYREL ) 50 MG tablet Take 1-2 tablets (50-100 mg total) by mouth at bedtime. 60 tablet 1   traZODone  (DESYREL ) 50 MG tablet Take 1-2 tablets (50-100 mg total) by mouth at bedtime. 60 tablet 1   traZODone  (DESYREL ) 50 MG tablet Take 1-2 tablets (50-100 mg total) by mouth at bedtime. 60 tablet 1   No current facility-administered medications on file prior to visit.    ALLERGIES: No Known Allergies  FAMILY HISTORY: Family History  Problem Relation Age of Onset   Diabetes Mother    Cancer Sister        Lump removed frfom shoulder   Heart disease Maternal Grandfather        MI   Cancer Paternal Grandmother        breast      Objective:  *** General: No acute distress.  Patient appears well-groomed.   Head:  Normocephalic/atraumatic Neck:  Supple.  No paraspinal tenderness.  Full range of motion. Heart:  Regular rate and rhythm. Neuro:  Alert and oriented.  Speech fluent and not dysarthric.  Language intact.  CN II-XII intact.  Bulk and tone normal.  Muscle strength 5/5 throughout.  Sensation to light touch intact.  Deep tendon reflexes 2+ throughout, toes downgoing.  Gait normal.  Romberg negative.    Juliene Dunnings, DO  CC: Ozell Kubas, PA-C ***       "

## 2024-04-20 ENCOUNTER — Ambulatory Visit: Admitting: Neurology

## 2024-05-17 ENCOUNTER — Ambulatory Visit

## 2024-05-18 ENCOUNTER — Ambulatory Visit

## 2024-06-17 ENCOUNTER — Ambulatory Visit

## 2024-06-18 ENCOUNTER — Ambulatory Visit

## 2024-07-14 ENCOUNTER — Ambulatory Visit

## 2024-10-13 ENCOUNTER — Ambulatory Visit
